# Patient Record
Sex: Male | Born: 1950 | Race: White | Hispanic: No | Smoking: Never smoker
Health system: Southern US, Community
[De-identification: ages and names within clinical notes are randomized; demographics above are authoritative.]

## PROBLEM LIST (undated history)

## (undated) DIAGNOSIS — I509 Heart failure, unspecified: Secondary | ICD-10-CM

## (undated) DIAGNOSIS — K219 Gastro-esophageal reflux disease without esophagitis: Secondary | ICD-10-CM

## (undated) DIAGNOSIS — N183 Chronic kidney disease, stage 3 unspecified: Secondary | ICD-10-CM

## (undated) DIAGNOSIS — I2510 Atherosclerotic heart disease of native coronary artery without angina pectoris: Secondary | ICD-10-CM

## (undated) DIAGNOSIS — G4730 Sleep apnea, unspecified: Secondary | ICD-10-CM

## (undated) DIAGNOSIS — I2089 Other forms of angina pectoris: Secondary | ICD-10-CM

## (undated) DIAGNOSIS — E119 Type 2 diabetes mellitus without complications: Secondary | ICD-10-CM

## (undated) DIAGNOSIS — I119 Hypertensive heart disease without heart failure: Secondary | ICD-10-CM

## (undated) DIAGNOSIS — I219 Acute myocardial infarction, unspecified: Secondary | ICD-10-CM

## (undated) DIAGNOSIS — I208 Other forms of angina pectoris: Secondary | ICD-10-CM

## (undated) HISTORY — DX: Acute myocardial infarction, unspecified: I21.9

## (undated) HISTORY — PX: CORONARY ARTERY BYPASS GRAFT: SHX141

## (undated) HISTORY — PX: WRIST SURGERY: SHX841

## (undated) HISTORY — PX: BACK SURGERY: SHX140

## (undated) HISTORY — PX: REPLACEMENT TOTAL KNEE: SUR1224

## (undated) HISTORY — PX: CARDIAC CATHETERIZATION: SHX172

---

## 2000-09-18 ENCOUNTER — Encounter: Payer: Self-pay | Admitting: Cardiothoracic Surgery

## 2000-09-19 ENCOUNTER — Inpatient Hospital Stay (HOSPITAL_COMMUNITY): Admission: RE | Admit: 2000-09-19 | Discharge: 2000-09-25 | Payer: Self-pay | Admitting: Cardiothoracic Surgery

## 2000-09-19 ENCOUNTER — Encounter: Payer: Self-pay | Admitting: Cardiothoracic Surgery

## 2000-09-20 ENCOUNTER — Encounter: Payer: Self-pay | Admitting: Cardiothoracic Surgery

## 2000-09-21 ENCOUNTER — Encounter: Payer: Self-pay | Admitting: Cardiothoracic Surgery

## 2001-09-29 ENCOUNTER — Encounter: Payer: Self-pay | Admitting: Emergency Medicine

## 2001-09-29 ENCOUNTER — Emergency Department (HOSPITAL_COMMUNITY): Admission: EM | Admit: 2001-09-29 | Discharge: 2001-09-29 | Payer: Self-pay | Admitting: Emergency Medicine

## 2003-02-04 ENCOUNTER — Encounter: Payer: Self-pay | Admitting: Orthopedic Surgery

## 2003-02-10 ENCOUNTER — Inpatient Hospital Stay (HOSPITAL_COMMUNITY): Admission: RE | Admit: 2003-02-10 | Discharge: 2003-02-15 | Payer: Self-pay | Admitting: Orthopedic Surgery

## 2004-05-03 ENCOUNTER — Other Ambulatory Visit: Payer: Self-pay

## 2006-06-26 ENCOUNTER — Ambulatory Visit: Payer: Self-pay | Admitting: *Deleted

## 2006-07-19 ENCOUNTER — Ambulatory Visit: Payer: Self-pay | Admitting: Gastroenterology

## 2006-07-19 LAB — HM COLONOSCOPY

## 2007-08-08 ENCOUNTER — Inpatient Hospital Stay (HOSPITAL_COMMUNITY): Admission: RE | Admit: 2007-08-08 | Discharge: 2007-08-14 | Payer: Self-pay | Admitting: Orthopedic Surgery

## 2007-09-03 ENCOUNTER — Encounter: Payer: Self-pay | Admitting: Orthopedic Surgery

## 2007-09-06 ENCOUNTER — Ambulatory Visit: Payer: Self-pay | Admitting: Cardiology

## 2007-09-23 ENCOUNTER — Encounter: Payer: Self-pay | Admitting: Orthopedic Surgery

## 2007-10-21 ENCOUNTER — Encounter: Payer: Self-pay | Admitting: Orthopedic Surgery

## 2009-03-30 ENCOUNTER — Ambulatory Visit: Payer: Self-pay | Admitting: Family Medicine

## 2011-01-04 NOTE — Op Note (Signed)
Joshua Larsen, Joshua Larsen             ACCOUNT NO.:  [id number]   MEDICAL RECORD NO.:  [id number]          PATIENT TYPE:  INP   LOCATION:  0006                         FACILITY:  [facility]   PHYSICIAN:  Ollen Gross, M.D.    DATE OF BIRTH:  05/12/1951   DATE OF PROCEDURE:  08/08/2007  DATE OF DISCHARGE:                               OPERATIVE REPORT   PREOPERATIVE DIAGNOSIS:  Osteoarthritis, right knee.   POSTOPERATIVE DIAGNOSIS:  Osteoarthritis, right knee.   PROCEDURE:  Right total knee arthroplasty.   SURGEON:  Ollen Gross, M.D.   ASSISTANT:  Alexzandrew L. Perkins, P.A.C.   ANESTHESIA:  General with postop Marcaine pain pump.   ESTIMATED BLOOD LOSS:  Minimal.   DRAINS:  None.   TOURNIQUET TIME:  47 minutes at 300 mmHg.   COMPLICATIONS:  None.   CONDITION:  Stable to recovery.   BRIEF CLINICAL NOTE:  Joshua Larsen is a 60 year old male with severe end stage  arthritis of the right knee with progressively worsening pain and  dysfunction.  He has had a successful left total knee arthroplasty and  presents now for right total knee arthroplasty after failure of  nonoperative management.   PROCEDURE IN DETAIL:  After successful administration of general  anesthetic, a tourniquet was placed high on the right thigh and the  right lower extremity prepped and draped in the usual sterile fashion.  The extremity was wrapped in an Esmarch, knee flexed, tourniquet  inflated to 300 mmHg.  A midline incision was made with a 10 blade  through subcutaneous tissue to the level of the extensor mechanism.  A  fresh blade is used to make a medial parapatellar arthrotomy.  The soft  tissue of the proximal medial tibia was subperiosteally elevated to the  joint line with the knife and into the semimembranosus bursa with a Cobb  elevator.  The soft tissue laterally was elevated with attention being  paid to avoid the patellar tendon on the tibial tubercle.  The patella  was subluxed laterally, knee  flexed 90 degrees, ACL and PCL removed.  A  drill was used to create a starting hole in the distal femur and the  canal was thoroughly irrigated.  The 5 degrees right valgus alignment  guide is placed referencing off the posterior condyles, rotation is  marked on the block, pinned to remove 11 mm off the distal femur.  I  took 11 because of his flexion contracture.  The distal femoral  resection is made with an oscillating saw.  Sizing blocks placed, size 5  is the most appropriate.  Rotation was marked off the epicondylar axis.  Size 5 cutting block was placed and the anterior, posterior, and chamfer  cuts were made.   The tibia is subluxed forward and the menisci are removed.  Extramedullary tibial alignment guide is placed referencing proximally  at the medial aspect of the tibial tubercle and distally along the  second metatarsal axis and tibial crest.  The block is pinned to remove  about 10 mm off the non-deficient lateral side.  Tibial resection is  made with an oscillating saw.  Size 5 is the most appropriate tibial  component and the proximal tibia was prepared with the modular drill and  keel punch for a size 5.  Femoral preparation is completed with the  intercondylar cut.   A size 5 mobile bearing tibial trial, size 5 posterior stabilized  femoral trial, and a 10 mm posterior stabilized rotating platform insert  trial were placed.  With the 10, there was a tiny bit of hyperextension.  Went to a 12.5 which allowed for full extension with excellent varus  valgus anterior and posterior balance throughout full range of motion.  The patella was everted, thickness measured to be 27 mm.  Freehand  resection taken to 15 mm, 41 template is placed, lug holes were drilled,  trial patella was placed and it tracks normally.  Osteophytes were  removed off the posterior femur with the trial in place.  All trials  were removed and the cut bone surfaces prepared with pulsatile lavage.   Cement was mixed and once ready for implantation, the size 5 mobile  bearing tibial tray, size 5 posterior stabilized femur, and 41 patella  are cemented into place.  The patella was held with the clamp.  The  trial 12.5 insert was placed, knee held in full extension, and all  extruded cement removed.  When the cement had fully hardened, then the  wound was copiously irrigated with saline solution.  The trials were  removed and FloSeal injected on the posterior capsule.  The permanent  12.5 mm posterior stabilized rotating platform insert was placed into  the tibial tray.  FloSeal was then injected into the medial and lateral  gutters and suprapatellar area.  A moist sponge was placed and  tourniquet was released with a total time of 47 minutes.  A sponge is  held for two minutes, removed, and then minimal bleeding was  encountered.  That which was encountered was stopped with cautery.  The  wound was again irrigated and the arthrotomy closed with interrupted #1  PDS.  Flexion against gravity to 135 degrees.  The subcu was closed with  interrupted 2-0 Vicryl and subcuticular running 4-0 Monocryl.  The  incision is cleaned and dried and Steri-Strips and a bulky sterile  dressing applied.  He was placed into a knee immobilizer, awakened, and  transferred to recovery in stable condition.      Ollen Gross, M.D.  Electronically Signed     FA/MEDQ  D:  08/08/2007  T:  08/08/2007  Job:  [id number]

## 2011-01-07 NOTE — Discharge Summary (Signed)
NAME:  Joshua Larsen, Joshua Larsen                       ACCOUNT NO.:  [id number]   MEDICAL RECORD NO.:  [id number]                   PATIENT TYPE:  INP   LOCATION:  0453                                 FACILITY:  [facility]   PHYSICIAN:  Ollen Gross, M.D.                 DATE OF BIRTH:  Nov 08, 1950   DATE OF ADMISSION:  02/10/2003  DATE OF DISCHARGE:  02/15/2003                                 DISCHARGE SUMMARY   ADMITTING DIAGNOSES:  1. Severe osteoarthritis left knee.  2. Bronchitis.  3. Hypertension.  4. Coronary arterial disease.  5. Status post bypass grafting.   DISCHARGE DIAGNOSES:  1. Osteoarthritis left knee status post left total knee replacement     arthroplasty.  2. Mild postoperative blood loss anemia.  3. Mild postoperative hyponatremia.  4. Bronchitis.  5. Hypertension.  6. Coronary arterial disease.  7. Status post bypass grafting.   PROCEDURE:  The patient was taken to the OR on February 10, 2003.  Underwent a  left total knee arthroplasty.  Surgeon Ollen Gross, M.D.  Assistant  Alexzandrew L. Julien Girt, P.A.  Surgery under general anesthesia.  Minimal  blood loss.  Hemovac drain x1.  Tourniquet time 60 minutes with 350 mmHg.   CONSULTS:  None.   BRIEF HISTORY:  The patient is a 60 year old male who has been seen for  ongoing problems concerning his knee.  He has had painful clicking and  snapping in his left knee.  He is a very active gentleman.  He has undergone  cortisone injections in the past by Dr. Ainsley Spinner in [place].  He has seen  Ollen Gross, M.D. in the office where x-rays showed bone on bone deformity  and tricompartmental arthritis.  It is felt he has reached a point where he  could benefit from undergoing knee replacement.  Risks and benefits of the  procedure have been discussed with the patient.  He has elected to proceed  with surgery.   LABORATORY DATA:  CBC on admission:  Hemoglobin 14.1, hematocrit 40.7, white  cell count 8.9, red cell count 4.97.   Differential all within normal limits.  Postoperative H&H 10.6 and 30.3.  Last noted H&H 9.6 and 27.8.  PT/PTT on  admission 12.4 and 30 respectively with INR 0.9.  Serial proTimes followed.  Last noted PT/INR 24.7 and 2.7.  Chem panel on admission slightly lower ALP  at 38.  Remaining chem panel all within normal limits.  Serial BMETs were  followed.  Sodium did drop from 138 down to 131.  Glucose went up from 112  to 146 back down to 118.  Calcium dropped from 10 down to 7.8.  Urinalysis  on admission was negative.  Blood group type A+.  Chest x-ray dated February 08, 2003:  No evidence of active disease in the chest, appear to have been  previous coronary artery bypass grafts.   HOSPITAL COURSE:  The patient  was admitted to [facility].  Taken  to OR.  Underwent above stated procedure without complications.  The patient  tolerated procedure well.  Was later returned to recovery room and then to  the orthopedic floor for continued postoperative care.  Vital signs were  followed.  The patient was given 24 hours of postoperative IV antibiotics.  Placed on Coumadin for DVT prophylaxis.  The patient was placed on PC  analgesics for pain control following surgery and also supplemented by p.o.  medications.  Hemovac drain placed at time of surgery was pulled on  postoperative day one.  The patient also had a Marcaine inflow pain pump  placed at the time of surgery which was pulled on postoperative day two.  PT  and OT were consulted to assist with gait training, ambulation, and ADLs.  The patient was slow to progress with physical therapy initially only  ambulating approximately 10 feet by postoperative day two.  The patient had  a little bit of nausea on postoperative day one, although his pain was under  good control.  By day two the nausea had improved but he had developed some  itching.  Was given Atarax for the itching.  The Marcaine pain pump was then  removed.  PCA was  discontinued.  Also, the IV fluids were discontinued.  He,  unfortunately, developed some ankle pain that evening and the next day.  The  patient did have a known history of gout and was felt to be a gout flare-up.  He was given colchicine which only had a little bit of improvement.  He  actually did better on Indocin.  He was placed on Pepcid and Indocin.  We  were only going to use that for a couple of days while in the hospital.  By  day four the pain was still present, but slowly improving on the  medications.  It was noted even in his therapy notes that he was not able to  get up on day three and day four very well secondary to the gout.  His knee  was healing well.  By day two the dressing was changed.  No signs of  infection.  No hemarthrosis.  Continued to improve with daily wound care and  observation.  As the ankle pain did improve, he was able to get up a little  bit more.  In fact, by day four he more than likely would have been able to  go home had he been able to participate much better with therapy if he had  not had the gout flare-up but he was only slowly progressing.  By day five,  however, though his gout flare-up had improved.  He was getting up much  better with physical therapy.  Had been seen in rounds by the covering  orthopedic staff and it was decided the patient would be discharged home at  that time.   DISCHARGE PLAN:  The patient is discharged home on February 15, 2003.   DISCHARGE DIAGNOSES:  Please see above.   DISCHARGE MEDICATIONS:  1. Percocet for pain.  2. Robaxin per spasm.  3. Coumadin as per pharmacy protocol.  4. The patient was to continue home medications.   DIET:  As tolerated.   ACTIVITY:  Weightbearing as tolerated to left lower extremity.  Home health  PT, home health nursing through [facility].  Total knee protocol.   FOLLOWUP:  Follow up two weeks from surgery.    DISPOSITION:  Home.   CONDITION ON DISCHARGE:   Improved.    Alexzandrew L. Julien Girt, P.A.              Ollen Gross, M.D.    ALP/MEDQ  D:  03/10/2003  T:  03/10/2003  Job:  [id number]   cc:   Julieanne Manson  [address]  [place]  [place] [address]  Fax: [phone]

## 2011-01-07 NOTE — H&P (Signed)
Joshua Larsen, Joshua Larsen             ACCOUNT NO.:  [id number]   MEDICAL RECORD NO.:  [id number]          PATIENT TYPE:  INP   LOCATION:  1617                         FACILITY:  [facility]   PHYSICIAN:  Ollen Gross, M.D.    DATE OF BIRTH:  06/13/1951   DATE OF ADMISSION:  08/08/2007  DATE OF DISCHARGE:  08/14/2007                              HISTORY & PHYSICAL   CHIEF COMPLAINT:  Right knee pain.   HISTORY OF PRESENT ILLNESS:  The patient is a 56-year male who has been  seen by Dr. Lequita Halt for ongoing right knee pain.  He has known end-stage  arthritis and is seen by Dr. Lequita Halt for discussion of possible surgery.  He has obtained medical and cardiac clearance.  He has actually lost  weight. X-rays show end-stage bone-on-bone. He is ready to have surgery  done.  Risks and benefits discussed and patient's subsequently admitted  to the hospital.   ALLERGIES:  1. CODEINE causes sickness.  2. TAGAMET causes changes in mental status.   CURRENT MEDICATIONS:  Omeprazole, Lipofen, metoprolol, Naprosyn,  Diovan/hydrochlorothiazide.   PAST MEDICAL HISTORY:  1. History of gout.  2. Arthritis.  3. History of bronchitis.  4. Sleep apnea, hypertension.  5. Coronary arterial disease.  6. Reflux disease.  7. History of gastritis.  8. History of hepatitis.  9. History of renal calculi.   PAST MEDICAL AND SURGICAL HISTORY:  1. Back surgery for cyst.  2. Bypass surgery of five vessels.  3. Left knee replacement.  4. Right wrist surgery.   SOCIAL HISTORY:  Single, Merchandiser, retail at [employer]. Nonsmoker.  No  alcohol.  One child.  Girlfriend and mother will be assisting with care  after surgery.   FAMILY HISTORY:  Father with heart disease and stroke.  Mother with  arthritis.  Grandmother with cancer.   REVIEW OF SYSTEMS:  GENERAL:  No fevers, chills, night sweats.  NEUROLOGIC:  No seizures, syncope or paralysis.  RESPIRATORY:  No  shortness of breath, productive cough or hemoptysis.   CARDIOVASCULAR:  No chest pain, angina, orthopnea.  GI:  No nausea, vomiting, diarrhea,  constipation.  GU:  No dysuria, hematuria or discharge.  MUSCULOSKELETAL: Right knee.   PHYSICAL EXAMINATION:  VITAL SIGNS:  Pulse 70, respirations 12, blood  pressure 120/60.  GENERAL:  A 60 year old white male, well nourished, well developed,  overweight, obese, no acute distress, alert, oriented, and cooperative.  HEENT:  Normocephalic, atraumatic.  Pupils round and reactive.  Oropharynx clear.  EOMs intact.  NECK:  Supple with a trace left bruit.  CHEST:  Clear anterior posterior chest walls.  No rhonchi, rales or  wheezing.  HEART:  Regular rate and rhythm.  No murmur.  ABDOMEN:  Soft, round, protuberant abdomen.  Bowel sounds present.  RECTAL, BREASTS, GENITALIA:  Not done, not pertinent to present illness.  EXTREMITIES:  Right knee:  Marked crepitus.  Range of motion of 5-115.  Significant varus malalignment deformity.   IMPRESSION:  Osteoarthritis, right knee.   PLAN:  The patient will be admitted to [facility] to undergo  right total knee arthroplasty.  Surgery  will be performed by Dr. Ollen Gross.      Joshua Larsen, P.A.C.      Ollen Gross, M.D.  Electronically Signed    ALP/MEDQ  D:  08/26/2007  T:  08/26/2007  Job:  [id number]   cc:   Julieanne Manson  Fax: [phone]

## 2011-01-07 NOTE — Op Note (Signed)
[place]  Patient:    Joshua Larsen, Joshua Larsen                      MRN: [id number] Proc. Date: 09/19/00 Adm. Date:  [id number] Attending:  Mikey Bussing CC:         Mariel Kansky, M.D., [facility], [place], [place]  Kathlee Nations Suann Larry, M.D.   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  OPERATION/PROCEDURE: Coronary artery bypass grafting x 5 (left internal mammary artery to left anterior descending, saphenous vein graft to diagonal, sequential saphenous vein graft to the distal right coronary artery and posterolateral branch of the right coronary, saphenous vein graft to the obtuse marginal).  SURGEON: Mikey Bussing, M.D.  ASSISTANT: Sherrie George, P.A.-C.  ANESTHESIA: General by Dr. Kaylyn Layer. [place].  INDICATIONS FOR PROCEDURE: The patient is a 60 year old obese white male, who was evaluated by Dr. Lady Gary in [place], [place] for exertional dyspnea and chest tightness.  Cardiac catheterization was performed and demonstrated an 80% left main stenosis with high-grade proximal right coronary artery stenosis.  Overall left ventricular function was preserved.  The patient was referred for surgical coronary revascularization.  Prior to the operation I examined the patient in the office and reviewed the results of the cardiac catheterization with the patient and his mother.  I discussed the indications and expected benefits of coronary bypass surgery.  I discussed the major aspects of the operation including the choice of conduit, the location of the surgical incisions, the use of general anesthesia and cardiopulmonary bypass, and the expected possible recovery.  I reviewed the risks associated with  this operation including the risk of MI, CVA, bleeding, infection, blood transfusion requirement, and death.  He understood these implications for surgery, the alternatives of surgery, and he agreed to proceed with the operation under what I believe is informed consent.  OPERATIVE FINDINGS: The patients saphenous vein was of average quality.  The vein to the right coronary system was extremely tortuous with multiple branches.  The mammary artery was a good vessel with excellent flow.  Due to the patients body habitus exposure of the posterior ventricle was difficult. The obtuse marginal vessels were intramyocardial.  DESCRIPTION OF PROCEDURE: The patient was brought to the operating room and placed supine on the operating room table, and general anesthesia induced under invasive hemodynamic monitoring.  The chest, abdomen, and legs were prepped with Betadine and draped as a sterile field.  A median sternotomy was performed as the saphenous vein was harvested from the right leg.  The internal mammary artery was harvested as a pedicle graft from its origin at the subclavian vessels.  Heparin was administered and ACT was documented as being therapeutic.  The sternal retractor was placed, using the deep blades due to the patients body habitus.  Two pursestring sutures were placed in the ascending aorta and right atrium and the patient was cannulated and placed on bypass, and cooled to 32 degrees.  The coronaries for grafting were identified and the mammary artery and vein grafts were prepared for the distal anastomoses.  A cardioplegic cannula was placed and the patient was cooled to 28 degrees.  The aortic crossclamp was applied and 500 cc of cold blood cardioplegia delivered to the  aortic root with immediate cardioplegic arrest and septal temperature dropping to less than 12 degrees.  Topical iced saline slush was used to augment myocardial preservation and a pericardial insulator pad was  used to protect the left phrenic nerve.  The distal coronary anastomoses were then performed.  The first distal anastomosis was at the diagonal.  This was a 1.5 mm vessel with proximally 80% stenosis.  Reverse saphenous vein was sewn end-to-side with running 7-0 Prolene.  The second distal anastomosis was the obtuse marginal, which had a proximal 90% stenosis.  A reverse saphenous vein was sewn end-to-side with running 7-0 Prolene, with good flow through the graft.  The third and fourth distal anastomoses consisted of sequential vein to the distal right just prior to the posterior descending bifurcation and continuing to the posterolateral for the second anastomosis and the sequential graft.  The distal right was a 1.8 mm vessel with proximal 90% stenosis and a side branch of the vein was sewn side-to-side with running 7-0 Prolene with good flow through the graft. The fourth distal anastomosis was continuation of the sequential vein to the posterolateral, which was a 1.3 mm vessel with proximal 70% stenosis between the origin of the PL and the distal right.  The end of the vein was sewn end-to-side with running 7-0 Prolene and there was good flow through the graft.  Cardioplegia was redosed.  The fifth distal anastomosis was to the mid LAD, which was a 2.0 mm vessel with proximal 90% stenosis.  The left internal mammary artery pedicle was brought through an opening created in the left lateral pericardium and brought down on the LAD and sewn end-to-side with running 8-0 Prolene.  There was excellent flow through the anastomosis, with immediate rise in septal temperature after release of the pedicle clamp on the mammary artery.  The mammary pedicle was secured to the epicardium and the aortic crossclamp was removed.  The heart was cardioverted back to a regular rhythm.  Three proximal vein anastomoses were placed under a partial occlusion clamp.  The vein grafts were perfused and each had  excellent flow and hemostasis was documented at the  proximal and distal sites.  The patient was rewarmed to 37 degrees and temporary pacing wires were applied.  The lungs were re-expanded and the ventilator was resumed.  The patient was weaned from cardiopulmonary bypass without difficulty, with stable blood pressure and hemodynamics.  Protamine was administered and the cannulas were removed.  The mediastinum was irrigated with warm antibiotic irrigation.  The leg incision was irrigated and closed in standard fashion.  The pericardium was loosely reapproximated.  Two mediastinal and a left pleural chest tube were placed and brought out through separate incisions.  The sternum was reapproximated with interrupted steel wire.  The pectoralis fascia was closed with interrupted #1 Vicryl.  The subcutaneous was closed with a 2-0 Vicryl and the skin was closed with staples.  The patient was returned to the ICU in stable condition.  Crossclamp time was 58 minutes, with a bypass time of 120 minutes. DD:  09/19/00 TD:  09/20/00 Job: [id number]

## 2011-01-07 NOTE — Discharge Summary (Signed)
NAMENICHOLSON, STARACE             ACCOUNT NO.:  [id number]   MEDICAL RECORD NO.:  [id number]          PATIENT TYPE:  INP   LOCATION:  1617                         FACILITY:  [facility]   PHYSICIAN:  Ollen Gross, M.D.    DATE OF BIRTH:  January 21, 1951   DATE OF ADMISSION:  08/08/2007  DATE OF DISCHARGE:  08/14/2007                               DISCHARGE SUMMARY   ADMITTING DIAGNOSES:  1. Osteoarthritis, right knee.  2. History of gout.  3. Arthritis.  4. History of bronchitis.  5. Sleep apnea.  6. Hypertension.  7. Coronary arterial disease.  8. Reflux disease.  9. History of gastritis.  10.History of hepatitis.  11.History of renal calculi.   DISCHARGE DIAGNOSES:  1. Osteoarthritis, right knee, status post right total knee      arthroplasty.  2. Postoperative chest discomfort, felt to be gastrointestinal,      dyspepsia.  3. Postoperative blood loss anemia  4. History of gout.  5. Arthritis.  6. History of bronchitis.  7. Sleep apnea.  8. Hypertension.  9. Coronary arterial disease.  10.Reflux disease.  11.History of gastritis.  12.History of hepatitis.  13.History of renal calculi.   PROCEDURE:  August 08, 2007, right total knee.  Surgeon:  Dr. Lequita Halt.  Assistant:  Avel Peace PA-C.   CONSULTATIONS:  None.   BRIEF HISTORY:  Galen is a 60 year old male with severe end-stage  arthritis of right knee, progressively worsening pain and dysfunction,  successful left total knee.  He now presents for right total knee.   LABORATORY DATA:  Preop CBC showed hemoglobin 12.5, hematocrit 36.4,  white cell count 8.6.  Postoperatively, hemoglobin was 10.5, drifted  down to 9.9. Last hemoglobin 9.6, hematocrit  27.5.  PT and PTT preop  14.1 and 32, respectively.  INR 1.1.  Serial pro times followed. Last PT  24.7, INR 2.2.  Chemistry panel on admission all within normal limits.  Serial BMETs were followed and remained within normal limits.  Cardiac  enzymes taken x2.  First set  taken on August 09, 2007: CK normal at  210, CK-MB slightly elevated at 4.2 with a normal index of 2.0, troponin  0.01. Second set:  CK normal at 220, CK-MB a little high at 4.9, normal  index of 2.2, troponin 0.02.  Preop UA negative.  Blood group type A  positive.   EKG:  There is a preop EKG dated November 20, 2006:  Looks like sinus  rhythm confirmed, unable to read signature. Followup EKG on August 09, 2007:  Normal sinus rhythm, nonspecific T-wave abnormalities.  Unconfirmed also.   Two-view chest August 02, 2007:  Post CABG, no active disease.   Portable chest August 09, 2007:  No acute findings with normal change  since preop chest x-ray day of August 02, 2007.   HOSPITAL COURSE:  The patient was admitted to [facility],  tolerated seizure well, later transferred to recovery room and then the  orthopedic floor. Started on PCA and p.o. analgesic pain control  following surgery. Actually doing pretty well on the morning of day #1.  Had very little  pain, felt good.  Started to get up out of bed.  Started  on CPAP postop because he has sleep apnea. Medications were resumed  postop. Had no complaints. Started back on his omeprazole.  Later,  unfortunately, on the afternoon of day #1, got called to see Mr.  Termini for some chest discomfort.  He denied any shortness of breath  or lightheadedness.  No nausea and vomiting.  He has a prior history of  coronary arterial disease with bypass in 2002, but had not had any  cardiac events since then.  His cardiologist was out of town at [facility].  He did relate giving a history of change of position in bed  with his head tilted down earlier that morning. Had known reflux.  Episode started up about 30 minutes after that change in position.  He  was given an GI cocktail with almost immediate relief.  We did order  sublingual nitroglycerin also. Chest x-ray and EKG were ordered.  There  were no obvious changes on the  EKG compared. Cardiac enzymes looked  good. CK-MB was elevated on 2, but the index was normal, and the  troponins were normal. Felt to be more GI related, so we increased  omeprazole to twice a day. By the following day, he was doing much  better, no more chest discomfort, felt to be more dyspepsia in nature.   With regards to his therapy, he was doing better.  Started getting up  and walking a little bit more ambulation.  Unfortunately on day #3, he  started to have a little bit more difficult time with discomfort.  He  said both of his feet were extremely sensitive to the touch.  He does  have a history of gout. We discontinued the TED hose to his feet and  tried to keep his feet off the bed to lessen the pressure. This did slow  down his therapy over the weekend. Started him on prednisone for  possible flare. That did help because on the afternoon of day #4, he was  up walking about 140 feet. Foley was out. Continued to progress after  that, and that was all felt to be due to a gout flareup over the  weekend. By December 22, he was doing much better.  He had taken the  colchicine and was on the prednisone taper. His pain had improved.  He  is up ambulating much better.  Monitored him for one more day.  His leg  pain continued to improve, and by August 14, 2007, he was doing well  and was discharged home.   DISCHARGE PLAN:  1. The patient was discharged home on August 14, 2007.  2. For Discharge Diagnoses, please see above.  3. Discharge medications:  Vicodin, Flexeril Coumadin and finishing      the prednisone taper.  4. Activity:  Weightbearing as tolerated right leg.  5. Home health PT and home health nursing, total knee protocol.  6. Diet.  Resume home diet.  7. Followup:  Follow up with Dr. Lequita Halt on December 30 or January 2.      Call for appointment.  8. Daily dressing changes.   DISPOSITION:  Home.   CONDITION ON DISCHARGE:  Improved.      Alexzandrew L.  Perkins, P.A.C.      Ollen Gross, M.D.  Electronically Signed    ALP/MEDQ  D:  09/25/2007  T:  09/25/2007  Job:  [id number]   cc:   Gerlene Burdock  [place]  Fax: [phone]

## 2011-01-07 NOTE — Discharge Summary (Signed)
[facility]  Patient:    ANTWAIN, CALIENDO                      MRN: [id number] Adm. Date:  [id number] Disc. Date: 09/24/00 Attending:  Mikey Bussing Dictator:   Myrlene Broker, P.A.                           Discharge Summary  DATE OF BIRTH:  1951-07-03  SURGEON:  Mikey Bussing, M.D.  CARDIOLOGIST:  Dr. Harold Hedge.  PRIMARY CARE PHYSICIAN:  Dr. Julieanne Manson.  PRIMARY ADMISSION DIAGNOSIS/DISCHARGE DIAGNOSIS:  Class III angina, positive stress test and left main stenosis with severe three-vessel coronary artery disease.  SECONDARY DIAGNOSES/PREEXISTING CONDITIONS: 1. Hypertension. 2. Obesity. 3. Gout. 4. History of hepatitis A at 60 years old. 5. Degenerative joint disease with left knee.  DISCHARGE DIAGNOSIS:  Left main stenosis with severe three-vessel coronary artery disease.  PROCEDURES:  On September 19, 2000, coronary artery bypass graft surgery x 5 was done with left internal mammary artery to the left anterior descending artery, saphenous vein graft to the circumflex, saphenous vein graft to the diagonal, saphenous vein graft to the distal right coronary artery, and posterior descending coronary artery.  HOSPITAL COURSE:  The patient was admitted on September 19, 2000, underwent coronary artery bypass graft done on September 19, 2000.  The patient tolerated the procedure well.  His postoperative course was uneventful.  The patient was ambulated daily by cardiac rehab, and the patient is anticipated for discharge in the morning of September 24, 2000.  ACTIVITY:  The patient is instructed not to do any driving or lifting more than 10 pounds.  To walk daily.  To continue breathing exercises.  DIET:  The patient is to follow a low fat, low sodium diet.  WOUND CARE:  He can shower and clean the wounds with mild soap and water, and to call the office if any wound problems arise as noted on facts sheet.  The patient was  given cardiac surgery facts sheet.  FOLLOWUP:  The patient is to have a staple removal in one week after discharge.  The patient is to have a follow-up appointment with Dr. Lady Gary.  He needs to call the office to be seen in two weeks, and to have his chest x-ray taken there at his cardiologists office, and to bring that chest x-ray to his appointment with Dr. Morton Peters in three weeks.  The office will call the patient with that appointment.  CONDITION ON DISCHARGE:  Stable and improved.  DISCHARGE MEDICATIONS: 1. Enteric-coated aspirin daily. 2. Darvocet-N 100 one tab q.4-6h. p.r.n. pain. 3. Atenolol 12.5 mg b.i.d. 4. Ativan 1 mg q.d. 5. Nitroglycerin p.r.n. 6. Indomethacin 25 mg q.d. for gout. 7. The patient is to continue his home medications and vitamins. DD:  09/23/00 TD:  09/25/00 Job: [id number]

## 2011-01-07 NOTE — Op Note (Signed)
NAME:  Joshua Larsen, Joshua Larsen                       ACCOUNT NO.:  [id number]   MEDICAL RECORD NO.:  [id number]                   PATIENT TYPE:  INP   LOCATION:  0004                                 FACILITY:  [facility]   PHYSICIAN:  Ollen Gross, M.D.                 DATE OF BIRTH:  1950-09-05   DATE OF PROCEDURE:  02/10/2003  DATE OF DISCHARGE:                                 OPERATIVE REPORT   PREOPERATIVE DIAGNOSIS:  Osteoarthritis, left knee.   POSTOPERATIVE DIAGNOSIS:  Osteoarthritis, left knee.   PROCEDURE:  Left total knee arthroplasty.   SURGEON:  Ollen Gross, M.D.   ASSISTANT:  Alexzandrew L. Julien Girt, P.A.   ANESTHESIA:  General.   ESTIMATED BLOOD LOSS:  Minimal.   DRAINS:  Hemovac x1.   COMPLICATIONS:  None.   TOURNIQUET TIME:  60 minutes at  350 mmHg.   CONDITION:  Stable to recovery.   CLINICAL NOTE:  Joshua Larsen is a 60 year old male with severe end-stage  osteoarthritis of both knees, with pain refractory to nonoperative  management.  The left knee is currently more symptomatic than the right.  He  presents now for left total knee arthroplasty.   PROCEDURE IN DETAIL:  After successful administration of general anesthetic,  a tourniquet was placed high on the left thigh and the left lower extremity  prepped and draped in the usual sterile fashion.  The extremity was wrapped  in an Esmarch, the knee flexed, the tourniquet inflated to 350 mmHg.  A  standard midline incision was made with a 10 blade through subcutaneous  tissue to the level of the extensor mechanism.  A fresh blade was used to  make a medial parapatellar arthrotomy and the soft tissue over the proximal  medial tibia was subperiosteally elevated to the joint line with a knife and  at the semimembranosus bursa with a curved osteotome.  The soft tissue over  the proximal lateral tibia is also elevated with attention being paid to  avoid the patellar tendon on the tibial tubercle.  The patella is  everted,  the knee flexed 90 degrees, ACL and PCL removed.  He had severe  tricompartmental degenerative change with large osteophyte formation.  A  drill was used to create a starting hole in the distal femur.  The canal was  irrigated and the 5 degree left valgus alignment guide was placed.  Referencing off the posterior condyles, rotation was marked and the block  pinned to remove 10 mm off the distal femur.  Distal femoral resection is  made with an oscillating saw.  A sizing block is placed and a size 6 is the  most appropriate in the AP and the medial and lateral planes.  Rotation is  marked off, the epicondylar axis block pinned, and the anterior and  posterior cuts made for a size 6.  The tibia is then subluxed forward and  the menisci removed.  The  extramedullary tibial alignment guide is placed,  referencing proximally at the medial aspect of the tibial tubercle and  distally along the second metatarsal axis at the tibial crest.  The block is  pinned to remove 10 mm off the nondeficient lateral side.  Tibial resection  is made with an oscillating saw.  Sizing is placed and size 5 is the most  appropriate tibia.  The proximal tibia is then prepared with a modular drill  and keel punch.   Femoral preparation is then completed with the intercondylar and chamfer  cuts.  A size 6 posterior-stabilized femoral trial and size 5 mobile bearing  tibial trial with a 10 mm posterior-stabilized rotating platform insert  trial were placed.  The knee is not able to be fully extended, with  excellent varus and valgus balance throughout full range of motion.  The  patella is then everted, thickness measured to be 27 mm, and free-hand  resection taken to 15 mm, a 38 template placed, lug holes drilled, trial  patella placed and tracks normally.  The osteophytes are then removed off  the posterior femur with the trial in place.  All trials are removed and the  cut bone surfaces prepared with  pulsatile lavage.  Cement is mixed and once  ready for implantation, a size 6 posterior-stabilized femur, size 5 mobile  bearing tibial tray, and 38 patella are cemented into place.  The size 6 10  mm insert trial is placed, the knee held in full extension, and all extruded  cement removed.  Once cement is fully hardened, then a permanent 10 mm size  6 rotating platform, posterior-stabilized implant is placed to the tibial  tray.  The wound is copiously irrigated with antibiotic solution and the  extensor mechanism closed over a Hemovac drain with interrupted #1 PDS.  Flexion against gravity is about 115 degrees.  The stop was his calf hitting  up against his posterior thigh.  The tourniquet is released, a total time of  60 minutes.  Subcu tissue closed with interrupted 2-0 Vicryl, subcuticularly  with 4-0 Monocryl.  The incision is cleaned and dried and Steri-Strips and a  bulky sterile dressing applied.  He is placed into a knee immobilizer,  awakened, and transported to recovery in stable condition.  Prior to this, a  Marcaine pain catheter was placed into the subcutaneous tissues and the pump  was started.                                               Ollen Gross, M.D.    FA/MEDQ  D:  02/10/2003  T:  02/11/2003  Job:  [id number]

## 2011-01-07 NOTE — H&P (Signed)
NAME:  Joshua Larsen, Joshua Larsen                       ACCOUNT NO.:  [id number]   MEDICAL RECORD NO.:  [id number]                   PATIENT TYPE:  INP   LOCATION:  NA                                   FACILITY:  [facility]   PHYSICIAN:  Ollen Gross, M.D.                 DATE OF BIRTH:  October 20, 1950   DATE OF ADMISSION:  02/10/2003  DATE OF DISCHARGE:                                HISTORY & PHYSICAL   CHIEF COMPLAINT:  Pain in my left knee.   HISTORY OF PRESENT ILLNESS:  This 60 year old male has been seen by [place] for  continuing problems concerning his knees.  The patient was seen recently on  December 17, 2002, complaining of left knee pain.  He has constant pain, has  extreme difficulty in getting walking coming from a sitting position.  He  has painful clicking and snapping in the knee.  He is a very active  gentleman.  He has noticed other than the activity inhibition that occurs  because of his knee pain.  He has had cortisone injections to his knee by  Dr. Kennith Center in [place].  X-rays have shown bone on bone deformities with  tricompartmental arthritis.  After much discussion and consideration, it was  felt this patient would benefit from surgical intervention and is being  admitted for a total knee replacement arthroplasty of the left knee.   The patient had a five-vessel bypass surgery by Dr. Donata Clay two years ago  and is doing quite well after that.   PAST MEDICAL HISTORY:  1. Surgeries include the bypass as mentioned above.  2. He had pilonidal cyst removal back in 1970.  3. He has history of fracture of the right distal radius with floating     bone in the carpus region.  4. Medically he has currently a dry cough treated with Prevacid and Biaxin.  5. He has recently been diagnosed with sleep apnea, now has a C-PAP machine.  6. He also has hypertension.  7. He has gastritis secondary to stress as well.   ALLERGIES:  1. TAGAMET.  2. CODEINE.   SOCIAL HISTORY:  The patient is  single.  He is Merchandiser, retail at [employer].  He has no intake of alcohol or tobacco products.  His primary  caregiver after surgery will be his mother, Mora Bellman.   FAMILY HISTORY:  Heart disease and stroke in the father.   REVIEW OF SYSTEMS:  CENTRAL NERVOUS SYSTEM:  No seizures or paralysis,  numbness, or double vision.  RESPIRATORY:  The patient has dry cough as  mentioned above, thought to be secondary to reflux irritation secondary to  bronchitis again under treatment.  CARDIOVASCULAR:  No chest pain, no  angina, no orthopnea.  GASTROINTESTINAL:  No nausea, vomiting, melena, or  bloody stools.  GENITOURINARY:  No discharge, dysuria, or hematuria/  MUSCULOSKELETAL:  Primarily in present illness.   CURRENT MEDICATIONS:  1. Indocin 25 mg.  2. Aspirin 81 mg daily.  He will stop these five days prior to surgery.  3. Prevacid 30 mg one daily.  4. Diovan 160 one daily.  5. HCTZ 25 mg one daily.  6. Toprol XL 25 mg in the evening.   PHYSICAL EXAMINATION:  GENERAL:  This is an alert and cooperative, friendly,  obese, 60 year old white male accompanied by his daughter.  VITAL SIGNS:  Blood pressure 140/64, pulse 68, respirations 18.  HEENT:  Normocephalic.  PERRLA.  EOM intact.  Oropharynx is clear.  CHEST:  Clear to auscultation.  No rhonchi or rales.  HEART:  Regular rate and rhythm.  No murmurs are heard.  ABDOMEN:  Soft, nontender.  Liver and spleen not felt.  GENITALIA/RECTAL:  Not done, not pertinent to present illness.  EXTREMITIES:  The patient has crepitus, range of motion to left knee 0-110  degrees and markedly uncomfortable.  He has a 10 degree varus deformity on  the left as well.   ADMISSION DIAGNOSES:  1. Severe osteoarthritis of the left knee.  2. Bronchitis.  3. Hypertension.  4. Coronary artery disease (post bypass).   PLAN:  The patient will undergo a left total knee replacement arthroplasty.  Plan to have home health after his regular hospitalization with  [place].           Dooley L. Cherlynn June.                 Ollen Gross, M.D.    DLU/MEDQ  D:  02/05/2003  T:  02/05/2003  Job:  [id number]   cc:   Julieanne Manson  [address]  [place]  [place] [address]  Fax: [phone]

## 2011-05-27 LAB — BASIC METABOLIC PANEL
BUN: 15
CO2: 27
Calcium: 8.4
Chloride: 101
Creatinine, Ser: 1.13
GFR calc Af Amer: >60
GFR calc non Af Amer: >60
Glucose, Bld: 129 — ABNORMAL HIGH
Glucose, Bld: 131 — ABNORMAL HIGH
Potassium: 3.7
Potassium: 4
Sodium: 135

## 2011-05-27 LAB — PROTIME-INR
INR: 2.2 — ABNORMAL HIGH
INR: 2.3 — ABNORMAL HIGH
Prothrombin Time: 19.8 — ABNORMAL HIGH
Prothrombin Time: 25.7 — ABNORMAL HIGH

## 2011-05-27 LAB — CBC
HCT: 28.7 — ABNORMAL LOW
HCT: 30.7 — ABNORMAL LOW
Hemoglobin: 10.5 — ABNORMAL LOW
MCHC: 34.4
MCHC: 34.8
MCV: 75.7 — ABNORMAL LOW
MCV: 76.2 — ABNORMAL LOW
MCV: 76.2 — ABNORMAL LOW
Platelets: 174
Platelets: 190
RBC: 3.63 — ABNORMAL LOW
RBC: 4.02 — ABNORMAL LOW
RDW: 15.8 — ABNORMAL HIGH
RDW: 16 — ABNORMAL HIGH
WBC: 11.3 — ABNORMAL HIGH
WBC: 11.5 — ABNORMAL HIGH

## 2011-05-27 LAB — CARDIAC PANEL(CRET KIN+CKTOT+MB+TROPI)
CK, MB: 4.2 — ABNORMAL HIGH
CK, MB: 4.9 — ABNORMAL HIGH
Relative Index: 2
Relative Index: 2.2
Troponin I: 0.02

## 2011-05-27 LAB — TYPE AND SCREEN: Antibody Screen: NEGATIVE

## 2011-05-30 LAB — URINALYSIS, ROUTINE W REFLEX MICROSCOPIC
Bilirubin Urine: NEGATIVE
Hgb urine dipstick: NEGATIVE
Ketones, ur: NEGATIVE
Protein, ur: NEGATIVE
Specific Gravity, Urine: 1.02
Urobilinogen, UA: 0.2

## 2011-05-30 LAB — COMPREHENSIVE METABOLIC PANEL
Alkaline Phosphatase: 39
BUN: 19
CO2: 26
Chloride: 108
Creatinine, Ser: 1.09
GFR calc non Af Amer: >60
Glucose, Bld: 115 — ABNORMAL HIGH
Potassium: 4.2
Total Bilirubin: 0.6

## 2011-05-30 LAB — CBC
HCT: 36.4 — ABNORMAL LOW
Hemoglobin: 12.5 — ABNORMAL LOW
MCV: 75.6 — ABNORMAL LOW
WBC: 8.6

## 2011-05-30 LAB — PROTIME-INR
INR: 1.1
Prothrombin Time: 14.1

## 2011-07-05 ENCOUNTER — Emergency Department: Payer: Self-pay | Admitting: Emergency Medicine

## 2011-11-09 DIAGNOSIS — E785 Hyperlipidemia, unspecified: Secondary | ICD-10-CM | POA: Diagnosis not present

## 2011-11-09 DIAGNOSIS — Z79899 Other long term (current) drug therapy: Secondary | ICD-10-CM | POA: Diagnosis not present

## 2011-11-09 DIAGNOSIS — E119 Type 2 diabetes mellitus without complications: Secondary | ICD-10-CM | POA: Diagnosis not present

## 2011-11-09 DIAGNOSIS — I2510 Atherosclerotic heart disease of native coronary artery without angina pectoris: Secondary | ICD-10-CM | POA: Diagnosis not present

## 2011-11-09 DIAGNOSIS — R5381 Other malaise: Secondary | ICD-10-CM | POA: Diagnosis not present

## 2011-11-21 DIAGNOSIS — I10 Essential (primary) hypertension: Secondary | ICD-10-CM | POA: Diagnosis not present

## 2011-11-21 DIAGNOSIS — I2510 Atherosclerotic heart disease of native coronary artery without angina pectoris: Secondary | ICD-10-CM | POA: Diagnosis not present

## 2011-11-21 DIAGNOSIS — E782 Mixed hyperlipidemia: Secondary | ICD-10-CM | POA: Diagnosis not present

## 2011-11-21 DIAGNOSIS — E119 Type 2 diabetes mellitus without complications: Secondary | ICD-10-CM | POA: Diagnosis not present

## 2011-12-01 DIAGNOSIS — I10 Essential (primary) hypertension: Secondary | ICD-10-CM | POA: Diagnosis not present

## 2011-12-01 DIAGNOSIS — N200 Calculus of kidney: Secondary | ICD-10-CM | POA: Diagnosis not present

## 2011-12-01 DIAGNOSIS — N179 Acute kidney failure, unspecified: Secondary | ICD-10-CM | POA: Diagnosis not present

## 2011-12-02 ENCOUNTER — Ambulatory Visit: Payer: Self-pay | Admitting: Nephrology

## 2011-12-02 DIAGNOSIS — N1330 Unspecified hydronephrosis: Secondary | ICD-10-CM | POA: Diagnosis not present

## 2011-12-02 DIAGNOSIS — N201 Calculus of ureter: Secondary | ICD-10-CM | POA: Diagnosis not present

## 2011-12-02 DIAGNOSIS — N200 Calculus of kidney: Secondary | ICD-10-CM | POA: Diagnosis not present

## 2011-12-06 DIAGNOSIS — N400 Enlarged prostate without lower urinary tract symptoms: Secondary | ICD-10-CM | POA: Diagnosis not present

## 2011-12-06 DIAGNOSIS — N201 Calculus of ureter: Secondary | ICD-10-CM | POA: Diagnosis not present

## 2011-12-06 DIAGNOSIS — N200 Calculus of kidney: Secondary | ICD-10-CM | POA: Diagnosis not present

## 2011-12-08 DIAGNOSIS — N200 Calculus of kidney: Secondary | ICD-10-CM | POA: Diagnosis not present

## 2011-12-13 DIAGNOSIS — N200 Calculus of kidney: Secondary | ICD-10-CM | POA: Diagnosis not present

## 2012-02-27 DIAGNOSIS — E669 Obesity, unspecified: Secondary | ICD-10-CM | POA: Diagnosis not present

## 2012-02-27 DIAGNOSIS — I10 Essential (primary) hypertension: Secondary | ICD-10-CM | POA: Diagnosis not present

## 2012-02-27 DIAGNOSIS — I2510 Atherosclerotic heart disease of native coronary artery without angina pectoris: Secondary | ICD-10-CM | POA: Diagnosis not present

## 2012-03-15 DIAGNOSIS — E7800 Pure hypercholesterolemia, unspecified: Secondary | ICD-10-CM | POA: Diagnosis not present

## 2012-03-15 DIAGNOSIS — I10 Essential (primary) hypertension: Secondary | ICD-10-CM | POA: Diagnosis not present

## 2012-03-15 DIAGNOSIS — N289 Disorder of kidney and ureter, unspecified: Secondary | ICD-10-CM | POA: Diagnosis not present

## 2012-03-15 DIAGNOSIS — Z23 Encounter for immunization: Secondary | ICD-10-CM | POA: Diagnosis not present

## 2012-03-15 DIAGNOSIS — I2510 Atherosclerotic heart disease of native coronary artery without angina pectoris: Secondary | ICD-10-CM | POA: Diagnosis not present

## 2012-03-30 DIAGNOSIS — E669 Obesity, unspecified: Secondary | ICD-10-CM | POA: Diagnosis not present

## 2012-03-30 DIAGNOSIS — E119 Type 2 diabetes mellitus without complications: Secondary | ICD-10-CM | POA: Diagnosis not present

## 2012-04-04 ENCOUNTER — Ambulatory Visit: Payer: Self-pay | Admitting: Family Medicine

## 2012-04-04 DIAGNOSIS — E119 Type 2 diabetes mellitus without complications: Secondary | ICD-10-CM | POA: Diagnosis not present

## 2012-04-04 DIAGNOSIS — IMO0002 Reserved for concepts with insufficient information to code with codable children: Secondary | ICD-10-CM | POA: Diagnosis not present

## 2012-04-04 DIAGNOSIS — E669 Obesity, unspecified: Secondary | ICD-10-CM | POA: Diagnosis not present

## 2012-04-04 DIAGNOSIS — M51379 Other intervertebral disc degeneration, lumbosacral region without mention of lumbar back pain or lower extremity pain: Secondary | ICD-10-CM | POA: Diagnosis not present

## 2012-04-04 DIAGNOSIS — M5137 Other intervertebral disc degeneration, lumbosacral region: Secondary | ICD-10-CM | POA: Diagnosis not present

## 2012-04-04 DIAGNOSIS — I2510 Atherosclerotic heart disease of native coronary artery without angina pectoris: Secondary | ICD-10-CM | POA: Diagnosis not present

## 2012-04-04 DIAGNOSIS — M549 Dorsalgia, unspecified: Secondary | ICD-10-CM | POA: Diagnosis not present

## 2012-04-12 ENCOUNTER — Ambulatory Visit: Payer: Self-pay

## 2012-04-12 DIAGNOSIS — Z7189 Other specified counseling: Secondary | ICD-10-CM | POA: Diagnosis not present

## 2012-04-12 DIAGNOSIS — E119 Type 2 diabetes mellitus without complications: Secondary | ICD-10-CM | POA: Diagnosis not present

## 2012-04-18 DIAGNOSIS — M79609 Pain in unspecified limb: Secondary | ICD-10-CM | POA: Diagnosis not present

## 2012-04-22 ENCOUNTER — Ambulatory Visit: Payer: Self-pay

## 2012-05-14 DIAGNOSIS — J069 Acute upper respiratory infection, unspecified: Secondary | ICD-10-CM | POA: Diagnosis not present

## 2012-05-14 DIAGNOSIS — E119 Type 2 diabetes mellitus without complications: Secondary | ICD-10-CM | POA: Diagnosis not present

## 2012-05-14 DIAGNOSIS — J329 Chronic sinusitis, unspecified: Secondary | ICD-10-CM | POA: Diagnosis not present

## 2012-05-14 DIAGNOSIS — I2510 Atherosclerotic heart disease of native coronary artery without angina pectoris: Secondary | ICD-10-CM | POA: Diagnosis not present

## 2012-05-23 DIAGNOSIS — I2510 Atherosclerotic heart disease of native coronary artery without angina pectoris: Secondary | ICD-10-CM | POA: Diagnosis not present

## 2012-05-23 DIAGNOSIS — E669 Obesity, unspecified: Secondary | ICD-10-CM | POA: Diagnosis not present

## 2012-05-30 DIAGNOSIS — I10 Essential (primary) hypertension: Secondary | ICD-10-CM | POA: Diagnosis not present

## 2012-05-30 DIAGNOSIS — I25810 Atherosclerosis of coronary artery bypass graft(s) without angina pectoris: Secondary | ICD-10-CM | POA: Diagnosis not present

## 2012-05-30 DIAGNOSIS — E782 Mixed hyperlipidemia: Secondary | ICD-10-CM | POA: Diagnosis not present

## 2012-05-30 DIAGNOSIS — E119 Type 2 diabetes mellitus without complications: Secondary | ICD-10-CM | POA: Diagnosis not present

## 2012-05-31 ENCOUNTER — Ambulatory Visit: Payer: Self-pay

## 2012-05-31 DIAGNOSIS — Z7189 Other specified counseling: Secondary | ICD-10-CM | POA: Diagnosis not present

## 2012-05-31 DIAGNOSIS — E119 Type 2 diabetes mellitus without complications: Secondary | ICD-10-CM | POA: Diagnosis not present

## 2012-06-04 DIAGNOSIS — J342 Deviated nasal septum: Secondary | ICD-10-CM | POA: Diagnosis not present

## 2012-06-04 DIAGNOSIS — H02409 Unspecified ptosis of unspecified eyelid: Secondary | ICD-10-CM | POA: Diagnosis not present

## 2012-06-04 DIAGNOSIS — J018 Other acute sinusitis: Secondary | ICD-10-CM | POA: Diagnosis not present

## 2012-06-04 DIAGNOSIS — J328 Other chronic sinusitis: Secondary | ICD-10-CM | POA: Diagnosis not present

## 2012-06-21 DIAGNOSIS — E119 Type 2 diabetes mellitus without complications: Secondary | ICD-10-CM | POA: Diagnosis not present

## 2012-06-22 ENCOUNTER — Ambulatory Visit: Payer: Self-pay

## 2012-06-26 DIAGNOSIS — J018 Other acute sinusitis: Secondary | ICD-10-CM | POA: Diagnosis not present

## 2012-06-26 DIAGNOSIS — H02409 Unspecified ptosis of unspecified eyelid: Secondary | ICD-10-CM | POA: Diagnosis not present

## 2012-06-26 DIAGNOSIS — J342 Deviated nasal septum: Secondary | ICD-10-CM | POA: Diagnosis not present

## 2012-06-26 DIAGNOSIS — J328 Other chronic sinusitis: Secondary | ICD-10-CM | POA: Diagnosis not present

## 2012-07-23 DIAGNOSIS — H02409 Unspecified ptosis of unspecified eyelid: Secondary | ICD-10-CM | POA: Diagnosis not present

## 2012-07-24 ENCOUNTER — Ambulatory Visit: Payer: Self-pay | Admitting: Otolaryngology

## 2012-07-24 DIAGNOSIS — Z01812 Encounter for preprocedural laboratory examination: Secondary | ICD-10-CM | POA: Diagnosis not present

## 2012-07-24 DIAGNOSIS — I2510 Atherosclerotic heart disease of native coronary artery without angina pectoris: Secondary | ICD-10-CM | POA: Diagnosis not present

## 2012-07-24 LAB — BASIC METABOLIC PANEL
Anion Gap: 4 — ABNORMAL LOW (ref 7–16)
Calcium, Total: 9.4 mg/dL (ref 8.5–10.1)
Chloride: 108 mmol/L — ABNORMAL HIGH (ref 98–107)
Co2: 28 mmol/L (ref 21–32)
EGFR (African American): >60

## 2012-07-24 LAB — CBC WITH DIFFERENTIAL/PLATELET
Basophil %: 0.5 %
Eosinophil %: 1.6 %
HCT: 34.8 % — ABNORMAL LOW (ref 40.0–52.0)
HGB: 11.5 g/dL — ABNORMAL LOW (ref 13.0–18.0)
Lymphocyte #: 1.9 10*3/uL (ref 1.0–3.6)
MCV: 79 fL — ABNORMAL LOW (ref 80–100)
Monocyte %: 6.5 %
Platelet: 213 10*3/uL (ref 150–440)
RBC: 4.42 10*6/uL (ref 4.40–5.90)

## 2012-07-31 ENCOUNTER — Ambulatory Visit: Payer: Self-pay | Admitting: Cardiology

## 2012-07-31 DIAGNOSIS — I200 Unstable angina: Secondary | ICD-10-CM | POA: Diagnosis not present

## 2012-07-31 DIAGNOSIS — I2510 Atherosclerotic heart disease of native coronary artery without angina pectoris: Secondary | ICD-10-CM | POA: Diagnosis not present

## 2012-08-01 DIAGNOSIS — I2510 Atherosclerotic heart disease of native coronary artery without angina pectoris: Secondary | ICD-10-CM | POA: Diagnosis not present

## 2012-08-02 ENCOUNTER — Ambulatory Visit: Payer: Self-pay | Admitting: Otolaryngology

## 2012-08-02 DIAGNOSIS — I10 Essential (primary) hypertension: Secondary | ICD-10-CM | POA: Diagnosis not present

## 2012-08-02 DIAGNOSIS — Z951 Presence of aortocoronary bypass graft: Secondary | ICD-10-CM | POA: Diagnosis not present

## 2012-08-02 DIAGNOSIS — E669 Obesity, unspecified: Secondary | ICD-10-CM | POA: Diagnosis not present

## 2012-08-02 DIAGNOSIS — Z880 Allergy status to penicillin: Secondary | ICD-10-CM | POA: Diagnosis not present

## 2012-08-02 DIAGNOSIS — Z79899 Other long term (current) drug therapy: Secondary | ICD-10-CM | POA: Diagnosis not present

## 2012-08-02 DIAGNOSIS — Z885 Allergy status to narcotic agent status: Secondary | ICD-10-CM | POA: Diagnosis not present

## 2012-08-02 DIAGNOSIS — H02409 Unspecified ptosis of unspecified eyelid: Secondary | ICD-10-CM | POA: Diagnosis not present

## 2012-08-02 DIAGNOSIS — G43909 Migraine, unspecified, not intractable, without status migrainosus: Secondary | ICD-10-CM | POA: Diagnosis not present

## 2012-08-02 DIAGNOSIS — E119 Type 2 diabetes mellitus without complications: Secondary | ICD-10-CM | POA: Diagnosis not present

## 2012-08-02 DIAGNOSIS — R0609 Other forms of dyspnea: Secondary | ICD-10-CM | POA: Diagnosis not present

## 2012-08-02 DIAGNOSIS — Z96659 Presence of unspecified artificial knee joint: Secondary | ICD-10-CM | POA: Diagnosis not present

## 2012-08-02 DIAGNOSIS — Z7982 Long term (current) use of aspirin: Secondary | ICD-10-CM | POA: Diagnosis not present

## 2012-08-02 DIAGNOSIS — I2510 Atherosclerotic heart disease of native coronary artery without angina pectoris: Secondary | ICD-10-CM | POA: Diagnosis not present

## 2012-08-27 DIAGNOSIS — E669 Obesity, unspecified: Secondary | ICD-10-CM | POA: Diagnosis not present

## 2012-10-02 DIAGNOSIS — I10 Essential (primary) hypertension: Secondary | ICD-10-CM | POA: Diagnosis not present

## 2012-10-25 DIAGNOSIS — M109 Gout, unspecified: Secondary | ICD-10-CM | POA: Diagnosis not present

## 2012-10-30 DIAGNOSIS — E119 Type 2 diabetes mellitus without complications: Secondary | ICD-10-CM | POA: Diagnosis not present

## 2012-10-30 DIAGNOSIS — N182 Chronic kidney disease, stage 2 (mild): Secondary | ICD-10-CM | POA: Diagnosis not present

## 2012-10-30 DIAGNOSIS — E669 Obesity, unspecified: Secondary | ICD-10-CM | POA: Diagnosis not present

## 2012-10-30 DIAGNOSIS — I10 Essential (primary) hypertension: Secondary | ICD-10-CM | POA: Diagnosis not present

## 2012-12-03 DIAGNOSIS — E119 Type 2 diabetes mellitus without complications: Secondary | ICD-10-CM | POA: Diagnosis not present

## 2012-12-03 DIAGNOSIS — I25810 Atherosclerosis of coronary artery bypass graft(s) without angina pectoris: Secondary | ICD-10-CM | POA: Diagnosis not present

## 2012-12-03 DIAGNOSIS — I10 Essential (primary) hypertension: Secondary | ICD-10-CM | POA: Diagnosis not present

## 2012-12-03 DIAGNOSIS — E782 Mixed hyperlipidemia: Secondary | ICD-10-CM | POA: Diagnosis not present

## 2013-01-01 DIAGNOSIS — E119 Type 2 diabetes mellitus without complications: Secondary | ICD-10-CM | POA: Diagnosis not present

## 2013-01-22 DIAGNOSIS — E119 Type 2 diabetes mellitus without complications: Secondary | ICD-10-CM | POA: Diagnosis not present

## 2013-03-14 DIAGNOSIS — E785 Hyperlipidemia, unspecified: Secondary | ICD-10-CM | POA: Diagnosis not present

## 2013-03-14 DIAGNOSIS — I10 Essential (primary) hypertension: Secondary | ICD-10-CM | POA: Diagnosis not present

## 2013-03-14 DIAGNOSIS — E119 Type 2 diabetes mellitus without complications: Secondary | ICD-10-CM | POA: Diagnosis not present

## 2013-03-14 DIAGNOSIS — I2510 Atherosclerotic heart disease of native coronary artery without angina pectoris: Secondary | ICD-10-CM | POA: Diagnosis not present

## 2013-04-24 DIAGNOSIS — M109 Gout, unspecified: Secondary | ICD-10-CM | POA: Diagnosis not present

## 2013-04-24 DIAGNOSIS — E119 Type 2 diabetes mellitus without complications: Secondary | ICD-10-CM | POA: Diagnosis not present

## 2013-05-01 DIAGNOSIS — E119 Type 2 diabetes mellitus without complications: Secondary | ICD-10-CM | POA: Diagnosis not present

## 2013-05-01 DIAGNOSIS — I10 Essential (primary) hypertension: Secondary | ICD-10-CM | POA: Diagnosis not present

## 2013-05-22 DIAGNOSIS — I10 Essential (primary) hypertension: Secondary | ICD-10-CM | POA: Diagnosis not present

## 2013-05-22 DIAGNOSIS — E785 Hyperlipidemia, unspecified: Secondary | ICD-10-CM | POA: Diagnosis not present

## 2013-05-22 DIAGNOSIS — E119 Type 2 diabetes mellitus without complications: Secondary | ICD-10-CM | POA: Diagnosis not present

## 2013-05-22 DIAGNOSIS — I25810 Atherosclerosis of coronary artery bypass graft(s) without angina pectoris: Secondary | ICD-10-CM | POA: Diagnosis not present

## 2013-07-17 DIAGNOSIS — E785 Hyperlipidemia, unspecified: Secondary | ICD-10-CM | POA: Diagnosis not present

## 2013-07-17 DIAGNOSIS — R5381 Other malaise: Secondary | ICD-10-CM | POA: Diagnosis not present

## 2013-07-17 DIAGNOSIS — I10 Essential (primary) hypertension: Secondary | ICD-10-CM | POA: Diagnosis not present

## 2013-07-23 DIAGNOSIS — I2510 Atherosclerotic heart disease of native coronary artery without angina pectoris: Secondary | ICD-10-CM | POA: Diagnosis not present

## 2013-07-23 DIAGNOSIS — Z0000 Encounter for general adult medical examination without abnormal findings: Secondary | ICD-10-CM | POA: Diagnosis not present

## 2013-07-23 DIAGNOSIS — Z1331 Encounter for screening for depression: Secondary | ICD-10-CM | POA: Diagnosis not present

## 2013-07-23 DIAGNOSIS — E785 Hyperlipidemia, unspecified: Secondary | ICD-10-CM | POA: Diagnosis not present

## 2013-07-23 DIAGNOSIS — E119 Type 2 diabetes mellitus without complications: Secondary | ICD-10-CM | POA: Diagnosis not present

## 2013-07-23 DIAGNOSIS — Z1339 Encounter for screening examination for other mental health and behavioral disorders: Secondary | ICD-10-CM | POA: Diagnosis not present

## 2013-07-23 DIAGNOSIS — Z125 Encounter for screening for malignant neoplasm of prostate: Secondary | ICD-10-CM | POA: Diagnosis not present

## 2013-07-31 DIAGNOSIS — E119 Type 2 diabetes mellitus without complications: Secondary | ICD-10-CM | POA: Diagnosis not present

## 2013-08-07 DIAGNOSIS — E119 Type 2 diabetes mellitus without complications: Secondary | ICD-10-CM | POA: Diagnosis not present

## 2013-10-09 ENCOUNTER — Inpatient Hospital Stay: Payer: Self-pay | Admitting: Internal Medicine

## 2013-10-09 DIAGNOSIS — Z7902 Long term (current) use of antithrombotics/antiplatelets: Secondary | ICD-10-CM | POA: Diagnosis not present

## 2013-10-09 DIAGNOSIS — Z87442 Personal history of urinary calculi: Secondary | ICD-10-CM | POA: Diagnosis not present

## 2013-10-09 DIAGNOSIS — I214 Non-ST elevation (NSTEMI) myocardial infarction: Secondary | ICD-10-CM | POA: Diagnosis not present

## 2013-10-09 DIAGNOSIS — R52 Pain, unspecified: Secondary | ICD-10-CM | POA: Diagnosis not present

## 2013-10-09 DIAGNOSIS — Z800 Family history of malignant neoplasm of digestive organs: Secondary | ICD-10-CM | POA: Diagnosis not present

## 2013-10-09 DIAGNOSIS — Z96659 Presence of unspecified artificial knee joint: Secondary | ICD-10-CM | POA: Diagnosis not present

## 2013-10-09 DIAGNOSIS — I2510 Atherosclerotic heart disease of native coronary artery without angina pectoris: Secondary | ICD-10-CM | POA: Diagnosis present

## 2013-10-09 DIAGNOSIS — F329 Major depressive disorder, single episode, unspecified: Secondary | ICD-10-CM | POA: Diagnosis present

## 2013-10-09 DIAGNOSIS — I25810 Atherosclerosis of coronary artery bypass graft(s) without angina pectoris: Secondary | ICD-10-CM | POA: Diagnosis present

## 2013-10-09 DIAGNOSIS — Z7982 Long term (current) use of aspirin: Secondary | ICD-10-CM | POA: Diagnosis not present

## 2013-10-09 DIAGNOSIS — Z8674 Personal history of sudden cardiac arrest: Secondary | ICD-10-CM | POA: Diagnosis not present

## 2013-10-09 DIAGNOSIS — E119 Type 2 diabetes mellitus without complications: Secondary | ICD-10-CM | POA: Diagnosis not present

## 2013-10-09 DIAGNOSIS — I200 Unstable angina: Secondary | ICD-10-CM | POA: Diagnosis not present

## 2013-10-09 DIAGNOSIS — N200 Calculus of kidney: Secondary | ICD-10-CM | POA: Diagnosis not present

## 2013-10-09 DIAGNOSIS — G4733 Obstructive sleep apnea (adult) (pediatric): Secondary | ICD-10-CM | POA: Diagnosis present

## 2013-10-09 DIAGNOSIS — Z8249 Family history of ischemic heart disease and other diseases of the circulatory system: Secondary | ICD-10-CM | POA: Diagnosis not present

## 2013-10-09 DIAGNOSIS — Z79899 Other long term (current) drug therapy: Secondary | ICD-10-CM | POA: Diagnosis not present

## 2013-10-09 DIAGNOSIS — J984 Other disorders of lung: Secondary | ICD-10-CM | POA: Diagnosis not present

## 2013-10-09 DIAGNOSIS — E785 Hyperlipidemia, unspecified: Secondary | ICD-10-CM | POA: Diagnosis not present

## 2013-10-09 DIAGNOSIS — N189 Chronic kidney disease, unspecified: Secondary | ICD-10-CM | POA: Diagnosis present

## 2013-10-09 DIAGNOSIS — I428 Other cardiomyopathies: Secondary | ICD-10-CM | POA: Diagnosis not present

## 2013-10-09 DIAGNOSIS — F3289 Other specified depressive episodes: Secondary | ICD-10-CM | POA: Diagnosis present

## 2013-10-09 DIAGNOSIS — R0789 Other chest pain: Secondary | ICD-10-CM | POA: Diagnosis not present

## 2013-10-09 DIAGNOSIS — D509 Iron deficiency anemia, unspecified: Secondary | ICD-10-CM | POA: Diagnosis present

## 2013-10-09 DIAGNOSIS — Z823 Family history of stroke: Secondary | ICD-10-CM | POA: Diagnosis not present

## 2013-10-09 DIAGNOSIS — K219 Gastro-esophageal reflux disease without esophagitis: Secondary | ICD-10-CM | POA: Diagnosis present

## 2013-10-09 DIAGNOSIS — R918 Other nonspecific abnormal finding of lung field: Secondary | ICD-10-CM | POA: Diagnosis not present

## 2013-10-09 DIAGNOSIS — I10 Essential (primary) hypertension: Secondary | ICD-10-CM | POA: Diagnosis not present

## 2013-10-09 DIAGNOSIS — I129 Hypertensive chronic kidney disease with stage 1 through stage 4 chronic kidney disease, or unspecified chronic kidney disease: Secondary | ICD-10-CM | POA: Diagnosis present

## 2013-10-09 DIAGNOSIS — I447 Left bundle-branch block, unspecified: Secondary | ICD-10-CM | POA: Diagnosis present

## 2013-10-09 LAB — CBC
HCT: 35.6 % — ABNORMAL LOW (ref 40.0–52.0)
HGB: 11.3 g/dL — AB (ref 13.0–18.0)
MCH: 24.7 pg — ABNORMAL LOW (ref 26.0–34.0)
MCHC: 31.8 g/dL — ABNORMAL LOW (ref 32.0–36.0)
MCV: 78 fL — ABNORMAL LOW (ref 80–100)
Platelet: 258 10*3/uL (ref 150–440)
RBC: 4.57 10*6/uL (ref 4.40–5.90)
RDW: 16.2 % — ABNORMAL HIGH (ref 11.5–14.5)
WBC: 9.6 10*3/uL (ref 3.8–10.6)

## 2013-10-09 LAB — CK TOTAL AND CKMB (NOT AT ARMC)
CK, TOTAL: 211 U/L
CK, Total: 1765 U/L — ABNORMAL HIGH
CK, Total: 2443 U/L — ABNORMAL HIGH
CK-MB: 4 ng/mL — ABNORMAL HIGH (ref 0.5–3.6)
CK-MB: 181 ng/mL — ABNORMAL HIGH (ref 0.5–3.6)
CK-MB: 263.3 ng/mL — AB (ref 0.5–3.6)

## 2013-10-09 LAB — TROPONIN I
TROPONIN-I: 32 ng/mL — AB
Troponin-I: <0.02 ng/mL
Troponin-I: >40 ng/mL

## 2013-10-09 LAB — COMPREHENSIVE METABOLIC PANEL
ANION GAP: 6 — AB (ref 7–16)
Albumin: 3.6 g/dL (ref 3.4–5.0)
Alkaline Phosphatase: 33 U/L — ABNORMAL LOW
BILIRUBIN TOTAL: 0.2 mg/dL (ref 0.2–1.0)
BUN: 25 mg/dL — AB (ref 7–18)
CHLORIDE: 107 mmol/L (ref 98–107)
Calcium, Total: 9.4 mg/dL (ref 8.5–10.1)
Co2: 24 mmol/L (ref 21–32)
Creatinine: 1.49 mg/dL — ABNORMAL HIGH (ref 0.60–1.30)
GFR CALC AF AMER: 57 — AB
GFR CALC NON AF AMER: 50 — AB
GLUCOSE: 179 mg/dL — AB (ref 65–99)
OSMOLALITY: 283 (ref 275–301)
Potassium: 4 mmol/L (ref 3.5–5.1)
SGOT(AST): 25 U/L (ref 15–37)
SGPT (ALT): 22 U/L (ref 12–78)
Sodium: 137 mmol/L (ref 136–145)
TOTAL PROTEIN: 7.9 g/dL (ref 6.4–8.2)

## 2013-10-09 LAB — APTT
ACTIVATED PTT: 35.3 s (ref 23.6–35.9)
Activated PTT: 37.1 secs — ABNORMAL HIGH (ref 23.6–35.9)
Activated PTT: 51.7 secs — ABNORMAL HIGH (ref 23.6–35.9)

## 2013-10-09 LAB — MAGNESIUM: Magnesium: 1.4 mg/dL — ABNORMAL LOW

## 2013-10-09 LAB — URINALYSIS, COMPLETE
BACTERIA: NONE SEEN
Bilirubin,UR: NEGATIVE
Glucose,UR: NEGATIVE mg/dL (ref 0–75)
Hyaline Cast: 1
KETONE: NEGATIVE
LEUKOCYTE ESTERASE: NEGATIVE
NITRITE: NEGATIVE
PH: 5 (ref 4.5–8.0)
PROTEIN: NEGATIVE
SPECIFIC GRAVITY: 1.036 (ref 1.003–1.030)
WBC UR: 1 /HPF (ref 0–5)

## 2013-10-10 DIAGNOSIS — I214 Non-ST elevation (NSTEMI) myocardial infarction: Secondary | ICD-10-CM | POA: Diagnosis not present

## 2013-10-10 LAB — CBC WITH DIFFERENTIAL/PLATELET
BASOS ABS: 0 10*3/uL (ref 0.0–0.1)
Basophil %: 0.5 %
EOS ABS: 0.2 10*3/uL (ref 0.0–0.7)
EOS PCT: 1.9 %
HCT: 29.3 % — ABNORMAL LOW (ref 40.0–52.0)
HGB: 10 g/dL — ABNORMAL LOW (ref 13.0–18.0)
LYMPHS PCT: 18.1 %
Lymphocyte #: 1.8 10*3/uL (ref 1.0–3.6)
MCH: 26.3 pg (ref 26.0–34.0)
MCHC: 34.2 g/dL (ref 32.0–36.0)
MCV: 77 fL — ABNORMAL LOW (ref 80–100)
Monocyte #: 0.7 x10 3/mm (ref 0.2–1.0)
Monocyte %: 6.8 %
Neutrophil #: 7.2 10*3/uL — ABNORMAL HIGH (ref 1.4–6.5)
Neutrophil %: 72.7 %
PLATELETS: 222 10*3/uL (ref 150–440)
RBC: 3.81 10*6/uL — ABNORMAL LOW (ref 4.40–5.90)
RDW: 16.2 % — AB (ref 11.5–14.5)
WBC: 9.9 10*3/uL (ref 3.8–10.6)

## 2013-10-10 LAB — BASIC METABOLIC PANEL
Anion Gap: 5 — ABNORMAL LOW (ref 7–16)
BUN: 19 mg/dL — AB (ref 7–18)
CALCIUM: 8.8 mg/dL (ref 8.5–10.1)
CHLORIDE: 103 mmol/L (ref 98–107)
Co2: 26 mmol/L (ref 21–32)
Creatinine: 1.39 mg/dL — ABNORMAL HIGH (ref 0.60–1.30)
GFR CALC NON AF AMER: 54 — AB
GLUCOSE: 150 mg/dL — AB (ref 65–99)
Osmolality: 273 (ref 275–301)
POTASSIUM: 3.9 mmol/L (ref 3.5–5.1)
Sodium: 134 mmol/L — ABNORMAL LOW (ref 136–145)

## 2013-10-10 LAB — LIPID PANEL
CHOLESTEROL: 115 mg/dL (ref 0–200)
HDL Cholesterol: 15 mg/dL — ABNORMAL LOW (ref 40–60)
LDL CHOLESTEROL, CALC: 45 mg/dL (ref 0–100)
Triglycerides: 275 mg/dL — ABNORMAL HIGH (ref 0–200)
VLDL CHOLESTEROL, CALC: 55 mg/dL — AB (ref 5–40)

## 2013-10-10 LAB — APTT: ACTIVATED PTT: 55.2 s — AB (ref 23.6–35.9)

## 2013-10-10 LAB — TSH: Thyroid Stimulating Horm: 0.59 u[IU]/mL

## 2013-10-10 LAB — HEMOGLOBIN A1C: Hemoglobin A1C: <3.5 % — ABNORMAL LOW (ref 4.2–6.3)

## 2013-10-11 LAB — CBC WITH DIFFERENTIAL/PLATELET
BASOS ABS: 0 10*3/uL (ref 0.0–0.1)
BASOS PCT: 0.5 %
Eosinophil #: 0.1 10*3/uL (ref 0.0–0.7)
Eosinophil %: 0.9 %
HCT: 30 % — ABNORMAL LOW (ref 40.0–52.0)
HGB: 10.2 g/dL — ABNORMAL LOW (ref 13.0–18.0)
LYMPHS PCT: 18.2 %
Lymphocyte #: 1.4 10*3/uL (ref 1.0–3.6)
MCH: 26.4 pg (ref 26.0–34.0)
MCHC: 34 g/dL (ref 32.0–36.0)
MCV: 78 fL — AB (ref 80–100)
MONO ABS: 0.6 x10 3/mm (ref 0.2–1.0)
Monocyte %: 7.6 %
NEUTROS PCT: 72.8 %
Neutrophil #: 5.8 10*3/uL (ref 1.4–6.5)
Platelet: 198 10*3/uL (ref 150–440)
RBC: 3.87 10*6/uL — ABNORMAL LOW (ref 4.40–5.90)
RDW: 16.7 % — ABNORMAL HIGH (ref 11.5–14.5)
WBC: 7.9 10*3/uL (ref 3.8–10.6)

## 2013-10-11 LAB — BASIC METABOLIC PANEL
Anion Gap: 8 (ref 7–16)
BUN: 17 mg/dL (ref 7–18)
CALCIUM: 8.8 mg/dL (ref 8.5–10.1)
CHLORIDE: 103 mmol/L (ref 98–107)
Co2: 24 mmol/L (ref 21–32)
Creatinine: 1.42 mg/dL — ABNORMAL HIGH (ref 0.60–1.30)
EGFR (African American): >60
EGFR (Non-African Amer.): 53 — ABNORMAL LOW
Glucose: 244 mg/dL — ABNORMAL HIGH (ref 65–99)
Osmolality: 280 (ref 275–301)
POTASSIUM: 3.7 mmol/L (ref 3.5–5.1)
Sodium: 135 mmol/L — ABNORMAL LOW (ref 136–145)

## 2013-10-12 LAB — BASIC METABOLIC PANEL
Anion Gap: 6 — ABNORMAL LOW (ref 7–16)
BUN: 18 mg/dL (ref 7–18)
CO2: 27 mmol/L (ref 21–32)
Calcium, Total: 9.1 mg/dL (ref 8.5–10.1)
Chloride: 104 mmol/L (ref 98–107)
Creatinine: 1.49 mg/dL — ABNORMAL HIGH (ref 0.60–1.30)
EGFR (African American): 57 — ABNORMAL LOW
GFR CALC NON AF AMER: 50 — AB
Glucose: 135 mg/dL — ABNORMAL HIGH (ref 65–99)
OSMOLALITY: 278 (ref 275–301)
Potassium: 4 mmol/L (ref 3.5–5.1)
Sodium: 137 mmol/L (ref 136–145)

## 2013-10-21 DIAGNOSIS — M25519 Pain in unspecified shoulder: Secondary | ICD-10-CM | POA: Diagnosis not present

## 2013-10-23 DIAGNOSIS — E785 Hyperlipidemia, unspecified: Secondary | ICD-10-CM | POA: Diagnosis not present

## 2013-10-23 DIAGNOSIS — R51 Headache: Secondary | ICD-10-CM | POA: Diagnosis not present

## 2013-10-23 DIAGNOSIS — I2510 Atherosclerotic heart disease of native coronary artery without angina pectoris: Secondary | ICD-10-CM | POA: Diagnosis not present

## 2013-10-23 DIAGNOSIS — R5381 Other malaise: Secondary | ICD-10-CM | POA: Diagnosis not present

## 2013-10-23 DIAGNOSIS — R5383 Other fatigue: Secondary | ICD-10-CM | POA: Diagnosis not present

## 2013-10-23 DIAGNOSIS — Z1331 Encounter for screening for depression: Secondary | ICD-10-CM | POA: Diagnosis not present

## 2013-10-23 DIAGNOSIS — I10 Essential (primary) hypertension: Secondary | ICD-10-CM | POA: Diagnosis not present

## 2013-10-25 DIAGNOSIS — I25810 Atherosclerosis of coronary artery bypass graft(s) without angina pectoris: Secondary | ICD-10-CM | POA: Diagnosis not present

## 2013-10-25 DIAGNOSIS — I10 Essential (primary) hypertension: Secondary | ICD-10-CM | POA: Diagnosis not present

## 2013-10-25 DIAGNOSIS — E782 Mixed hyperlipidemia: Secondary | ICD-10-CM | POA: Diagnosis not present

## 2013-11-08 LAB — BASIC METABOLIC PANEL
BUN: 54 mg/dL — AB (ref 4–21)
CREATININE: 1.8 mg/dL — AB (ref <=1.3)
GLUCOSE: 151 mg/dL
POTASSIUM: 5.1 mmol/L (ref 3.4–5.3)
Sodium: 140 mmol/L (ref 137–147)

## 2013-11-08 LAB — LIPID PANEL
Cholesterol: 97 mg/dL (ref 0–200)
HDL: 22 mg/dL — AB (ref 35–70)
LDL Cholesterol: 48 mg/dL
LDl/HDL Ratio: 2.2
Triglycerides: 133 mg/dL (ref 40–160)

## 2013-11-08 LAB — PSA: PSA: 1.7

## 2013-11-08 LAB — CBC AND DIFFERENTIAL
HEMATOCRIT: 34 % — AB (ref 41–53)
HEMOGLOBIN: 10.7 g/dL — AB (ref 13.5–17.5)
Neutrophils Absolute: 10 /uL
PLATELETS: 359 10*3/uL (ref 150–399)
WBC: 14.6 10^3/mL

## 2013-11-08 LAB — HEPATIC FUNCTION PANEL
ALT: 18 U/L (ref 10–40)
AST: 16 U/L (ref 14–40)
Alkaline Phosphatase: 30 U/L (ref 25–125)
BILIRUBIN, TOTAL: 0.2 mg/dL

## 2013-11-08 LAB — HEMOGLOBIN A1C: Hgb A1c MFr Bld: 9.7 % — AB (ref 4.0–6.0)

## 2013-11-11 DIAGNOSIS — M25519 Pain in unspecified shoulder: Secondary | ICD-10-CM | POA: Diagnosis not present

## 2013-11-26 DIAGNOSIS — Z1331 Encounter for screening for depression: Secondary | ICD-10-CM | POA: Diagnosis not present

## 2013-11-26 DIAGNOSIS — I2510 Atherosclerotic heart disease of native coronary artery without angina pectoris: Secondary | ICD-10-CM | POA: Diagnosis not present

## 2013-11-26 DIAGNOSIS — IMO0001 Reserved for inherently not codable concepts without codable children: Secondary | ICD-10-CM | POA: Diagnosis not present

## 2013-11-26 DIAGNOSIS — Z79899 Other long term (current) drug therapy: Secondary | ICD-10-CM | POA: Diagnosis not present

## 2013-11-26 DIAGNOSIS — I10 Essential (primary) hypertension: Secondary | ICD-10-CM | POA: Diagnosis not present

## 2013-12-03 DIAGNOSIS — R079 Chest pain, unspecified: Secondary | ICD-10-CM | POA: Diagnosis not present

## 2013-12-03 DIAGNOSIS — I10 Essential (primary) hypertension: Secondary | ICD-10-CM | POA: Diagnosis not present

## 2013-12-03 DIAGNOSIS — E782 Mixed hyperlipidemia: Secondary | ICD-10-CM | POA: Diagnosis not present

## 2013-12-03 DIAGNOSIS — I25810 Atherosclerosis of coronary artery bypass graft(s) without angina pectoris: Secondary | ICD-10-CM | POA: Diagnosis not present

## 2013-12-08 ENCOUNTER — Emergency Department: Payer: Self-pay | Admitting: Emergency Medicine

## 2013-12-08 DIAGNOSIS — Z951 Presence of aortocoronary bypass graft: Secondary | ICD-10-CM | POA: Diagnosis not present

## 2013-12-08 DIAGNOSIS — N23 Unspecified renal colic: Secondary | ICD-10-CM | POA: Diagnosis not present

## 2013-12-08 DIAGNOSIS — I10 Essential (primary) hypertension: Secondary | ICD-10-CM | POA: Diagnosis not present

## 2013-12-08 DIAGNOSIS — N390 Urinary tract infection, site not specified: Secondary | ICD-10-CM | POA: Diagnosis not present

## 2013-12-08 DIAGNOSIS — Z79899 Other long term (current) drug therapy: Secondary | ICD-10-CM | POA: Diagnosis not present

## 2013-12-08 DIAGNOSIS — Z7982 Long term (current) use of aspirin: Secondary | ICD-10-CM | POA: Diagnosis not present

## 2013-12-08 DIAGNOSIS — E119 Type 2 diabetes mellitus without complications: Secondary | ICD-10-CM | POA: Diagnosis not present

## 2013-12-08 LAB — URINALYSIS, COMPLETE
BILIRUBIN, UR: NEGATIVE
Glucose,UR: NEGATIVE mg/dL (ref 0–75)
Ketone: NEGATIVE
Leukocyte Esterase: NEGATIVE
NITRITE: NEGATIVE
Ph: 5 (ref 4.5–8.0)
Protein: NEGATIVE
RBC,UR: 292 /HPF (ref 0–5)
Specific Gravity: 1.016 (ref 1.003–1.030)
Squamous Epithelial: NONE SEEN
WBC UR: <1 /HPF (ref 0–5)

## 2013-12-08 LAB — COMPREHENSIVE METABOLIC PANEL
ALK PHOS: 33 U/L — AB
ALT: 27 U/L (ref 12–78)
Albumin: 3.7 g/dL (ref 3.4–5.0)
Anion Gap: 8 (ref 7–16)
BUN: 25 mg/dL — ABNORMAL HIGH (ref 7–18)
Bilirubin,Total: 0.4 mg/dL (ref 0.2–1.0)
CHLORIDE: 105 mmol/L (ref 98–107)
Calcium, Total: 8.9 mg/dL (ref 8.5–10.1)
Co2: 24 mmol/L (ref 21–32)
Creatinine: 1.85 mg/dL — ABNORMAL HIGH (ref 0.60–1.30)
EGFR (African American): 44 — ABNORMAL LOW
GFR CALC NON AF AMER: 38 — AB
GLUCOSE: 127 mg/dL — AB (ref 65–99)
Osmolality: 280 (ref 275–301)
Potassium: 4.4 mmol/L (ref 3.5–5.1)
SGOT(AST): 28 U/L (ref 15–37)
Sodium: 137 mmol/L (ref 136–145)
Total Protein: 7.9 g/dL (ref 6.4–8.2)

## 2013-12-08 LAB — CBC
HCT: 34.6 % — AB (ref 40.0–52.0)
HGB: 11 g/dL — ABNORMAL LOW (ref 13.0–18.0)
MCH: 24 pg — ABNORMAL LOW (ref 26.0–34.0)
MCHC: 31.7 g/dL — ABNORMAL LOW (ref 32.0–36.0)
MCV: 76 fL — ABNORMAL LOW (ref 80–100)
Platelet: 198 10*3/uL (ref 150–440)
RBC: 4.56 10*6/uL (ref 4.40–5.90)
RDW: 17.4 % — ABNORMAL HIGH (ref 11.5–14.5)
WBC: 10.1 10*3/uL (ref 3.8–10.6)

## 2013-12-09 DIAGNOSIS — M25519 Pain in unspecified shoulder: Secondary | ICD-10-CM | POA: Diagnosis not present

## 2013-12-10 LAB — URINE CULTURE

## 2013-12-12 DIAGNOSIS — N201 Calculus of ureter: Secondary | ICD-10-CM | POA: Diagnosis not present

## 2013-12-12 DIAGNOSIS — R109 Unspecified abdominal pain: Secondary | ICD-10-CM | POA: Diagnosis not present

## 2013-12-13 ENCOUNTER — Inpatient Hospital Stay: Payer: Self-pay | Admitting: Student

## 2013-12-13 DIAGNOSIS — I252 Old myocardial infarction: Secondary | ICD-10-CM | POA: Diagnosis not present

## 2013-12-13 DIAGNOSIS — Z6841 Body Mass Index (BMI) 40.0 and over, adult: Secondary | ICD-10-CM | POA: Diagnosis not present

## 2013-12-13 DIAGNOSIS — N201 Calculus of ureter: Secondary | ICD-10-CM | POA: Diagnosis not present

## 2013-12-13 DIAGNOSIS — K219 Gastro-esophageal reflux disease without esophagitis: Secondary | ICD-10-CM | POA: Diagnosis present

## 2013-12-13 DIAGNOSIS — I10 Essential (primary) hypertension: Secondary | ICD-10-CM | POA: Diagnosis not present

## 2013-12-13 DIAGNOSIS — M1990 Unspecified osteoarthritis, unspecified site: Secondary | ICD-10-CM | POA: Diagnosis present

## 2013-12-13 DIAGNOSIS — N200 Calculus of kidney: Secondary | ICD-10-CM | POA: Diagnosis not present

## 2013-12-13 DIAGNOSIS — N134 Hydroureter: Secondary | ICD-10-CM | POA: Diagnosis not present

## 2013-12-13 DIAGNOSIS — N189 Chronic kidney disease, unspecified: Secondary | ICD-10-CM | POA: Diagnosis not present

## 2013-12-13 DIAGNOSIS — E669 Obesity, unspecified: Secondary | ICD-10-CM | POA: Diagnosis present

## 2013-12-13 DIAGNOSIS — N1330 Unspecified hydronephrosis: Secondary | ICD-10-CM | POA: Diagnosis not present

## 2013-12-13 DIAGNOSIS — I129 Hypertensive chronic kidney disease with stage 1 through stage 4 chronic kidney disease, or unspecified chronic kidney disease: Secondary | ICD-10-CM | POA: Diagnosis present

## 2013-12-13 DIAGNOSIS — N23 Unspecified renal colic: Secondary | ICD-10-CM | POA: Diagnosis not present

## 2013-12-13 DIAGNOSIS — E785 Hyperlipidemia, unspecified: Secondary | ICD-10-CM | POA: Diagnosis present

## 2013-12-13 DIAGNOSIS — N179 Acute kidney failure, unspecified: Secondary | ICD-10-CM | POA: Diagnosis not present

## 2013-12-13 DIAGNOSIS — I2510 Atherosclerotic heart disease of native coronary artery without angina pectoris: Secondary | ICD-10-CM | POA: Diagnosis not present

## 2013-12-13 DIAGNOSIS — E119 Type 2 diabetes mellitus without complications: Secondary | ICD-10-CM | POA: Diagnosis not present

## 2013-12-13 DIAGNOSIS — I498 Other specified cardiac arrhythmias: Secondary | ICD-10-CM | POA: Diagnosis not present

## 2013-12-13 DIAGNOSIS — Z7982 Long term (current) use of aspirin: Secondary | ICD-10-CM | POA: Diagnosis not present

## 2013-12-13 LAB — URINALYSIS, COMPLETE
BILIRUBIN, UR: NEGATIVE
Bacteria: NONE SEEN
Blood: NEGATIVE
GLUCOSE, UR: NEGATIVE mg/dL (ref 0–75)
Ketone: NEGATIVE
Leukocyte Esterase: NEGATIVE
NITRITE: NEGATIVE
PROTEIN: NEGATIVE
Ph: 6 (ref 4.5–8.0)
SPECIFIC GRAVITY: 1.014 (ref 1.003–1.030)
Squamous Epithelial: <1
WBC UR: 1 /HPF (ref 0–5)

## 2013-12-13 LAB — CBC WITH DIFFERENTIAL/PLATELET
BASOS ABS: 0.2 10*3/uL — AB (ref 0.0–0.1)
Basophil %: 1.4 %
Eosinophil #: 0.1 10*3/uL (ref 0.0–0.7)
Eosinophil %: 1.1 %
HCT: 34 % — AB (ref 40.0–52.0)
HGB: 10.9 g/dL — ABNORMAL LOW (ref 13.0–18.0)
Lymphocyte #: 1.4 10*3/uL (ref 1.0–3.6)
Lymphocyte %: 11.8 %
MCH: 24.3 pg — ABNORMAL LOW (ref 26.0–34.0)
MCHC: 32 g/dL (ref 32.0–36.0)
MCV: 76 fL — ABNORMAL LOW (ref 80–100)
Monocyte #: 1.1 x10 3/mm — ABNORMAL HIGH (ref 0.2–1.0)
Monocyte %: 9.5 %
NEUTROS ABS: 9.2 10*3/uL — AB (ref 1.4–6.5)
Neutrophil %: 76.2 %
Platelet: 224 10*3/uL (ref 150–440)
RBC: 4.49 10*6/uL (ref 4.40–5.90)
RDW: 17.6 % — ABNORMAL HIGH (ref 11.5–14.5)
WBC: 12.1 10*3/uL — ABNORMAL HIGH (ref 3.8–10.6)

## 2013-12-13 LAB — COMPREHENSIVE METABOLIC PANEL
ALBUMIN: 3.8 g/dL (ref 3.4–5.0)
ALK PHOS: 32 U/L — AB
ALT: 21 U/L (ref 12–78)
ANION GAP: 7 (ref 7–16)
BILIRUBIN TOTAL: 0.8 mg/dL (ref 0.2–1.0)
BUN: 48 mg/dL — AB (ref 7–18)
Calcium, Total: 9.8 mg/dL (ref 8.5–10.1)
Chloride: 102 mmol/L (ref 98–107)
Co2: 25 mmol/L (ref 21–32)
Creatinine: 3.52 mg/dL — ABNORMAL HIGH (ref 0.60–1.30)
EGFR (Non-African Amer.): 18 — ABNORMAL LOW
GFR CALC AF AMER: 20 — AB
Glucose: 123 mg/dL — ABNORMAL HIGH (ref 65–99)
OSMOLALITY: 282 (ref 275–301)
POTASSIUM: 4.7 mmol/L (ref 3.5–5.1)
SGOT(AST): 15 U/L (ref 15–37)
SODIUM: 134 mmol/L — AB (ref 136–145)
Total Protein: 8.5 g/dL — ABNORMAL HIGH (ref 6.4–8.2)

## 2013-12-14 ENCOUNTER — Ambulatory Visit: Payer: Self-pay

## 2013-12-14 LAB — CBC WITH DIFFERENTIAL/PLATELET
BASOS ABS: 0.1 10*3/uL (ref 0.0–0.1)
Basophil %: 0.6 %
Eosinophil #: 0.2 10*3/uL (ref 0.0–0.7)
Eosinophil %: 1.7 %
HCT: 30 % — AB (ref 40.0–52.0)
HGB: 9.5 g/dL — AB (ref 13.0–18.0)
Lymphocyte #: 1.1 10*3/uL (ref 1.0–3.6)
Lymphocyte %: 10.4 %
MCH: 24.4 pg — ABNORMAL LOW (ref 26.0–34.0)
MCHC: 31.8 g/dL — AB (ref 32.0–36.0)
MCV: 77 fL — AB (ref 80–100)
MONO ABS: 0.8 x10 3/mm (ref 0.2–1.0)
Monocyte %: 7.7 %
Neutrophil #: 8.5 10*3/uL — ABNORMAL HIGH (ref 1.4–6.5)
Neutrophil %: 79.6 %
PLATELETS: 204 10*3/uL (ref 150–440)
RBC: 3.91 10*6/uL — ABNORMAL LOW (ref 4.40–5.90)
RDW: 17.4 % — ABNORMAL HIGH (ref 11.5–14.5)
WBC: 10.7 10*3/uL — AB (ref 3.8–10.6)

## 2013-12-14 LAB — BASIC METABOLIC PANEL
ANION GAP: 7 (ref 7–16)
ANION GAP: 8 (ref 7–16)
BUN: 52 mg/dL — AB (ref 7–18)
BUN: 51 mg/dL — ABNORMAL HIGH (ref 7–18)
CALCIUM: 8.9 mg/dL (ref 8.5–10.1)
CALCIUM: 8.6 mg/dL (ref 8.5–10.1)
CHLORIDE: 105 mmol/L (ref 98–107)
Chloride: 110 mmol/L — ABNORMAL HIGH (ref 98–107)
Co2: 23 mmol/L (ref 21–32)
Co2: 21 mmol/L (ref 21–32)
Creatinine: 3.55 mg/dL — ABNORMAL HIGH (ref 0.60–1.30)
Creatinine: 3.33 mg/dL — ABNORMAL HIGH (ref 0.60–1.30)
EGFR (Non-African Amer.): 17 — ABNORMAL LOW
GFR CALC AF AMER: 20 — AB
GFR CALC AF AMER: 22 — AB
GFR CALC NON AF AMER: 19 — AB
Glucose: 130 mg/dL — ABNORMAL HIGH (ref 65–99)
Glucose: 102 mg/dL — ABNORMAL HIGH (ref 65–99)
OSMOLALITY: 286 (ref 275–301)
Osmolality: 291 (ref 275–301)
POTASSIUM: 5.5 mmol/L — AB (ref 3.5–5.1)
Potassium: 5 mmol/L (ref 3.5–5.1)
SODIUM: 135 mmol/L — AB (ref 136–145)
Sodium: 139 mmol/L (ref 136–145)

## 2013-12-15 LAB — CBC WITH DIFFERENTIAL/PLATELET
BASOS ABS: 0 10*3/uL (ref 0.0–0.1)
BASOS PCT: 0.6 %
Eosinophil #: 0.2 10*3/uL (ref 0.0–0.7)
Eosinophil %: 2.9 %
HCT: 28.9 % — AB (ref 40.0–52.0)
HGB: 9.2 g/dL — ABNORMAL LOW (ref 13.0–18.0)
LYMPHS PCT: 21.5 %
Lymphocyte #: 1.3 10*3/uL (ref 1.0–3.6)
MCH: 24.4 pg — AB (ref 26.0–34.0)
MCHC: 31.8 g/dL — ABNORMAL LOW (ref 32.0–36.0)
MCV: 77 fL — ABNORMAL LOW (ref 80–100)
MONO ABS: 0.6 x10 3/mm (ref 0.2–1.0)
Monocyte %: 9.3 %
NEUTROS PCT: 65.7 %
Neutrophil #: 4.1 10*3/uL (ref 1.4–6.5)
Platelet: 178 10*3/uL (ref 150–440)
RBC: 3.76 10*6/uL — ABNORMAL LOW (ref 4.40–5.90)
RDW: 17.2 % — ABNORMAL HIGH (ref 11.5–14.5)
WBC: 6.2 10*3/uL (ref 3.8–10.6)

## 2013-12-15 LAB — URINE CULTURE

## 2013-12-15 LAB — BASIC METABOLIC PANEL
Anion Gap: 7 (ref 7–16)
BUN: 49 mg/dL — ABNORMAL HIGH (ref 7–18)
Calcium, Total: 8.4 mg/dL — ABNORMAL LOW (ref 8.5–10.1)
Chloride: 110 mmol/L — ABNORMAL HIGH (ref 98–107)
Co2: 22 mmol/L (ref 21–32)
Creatinine: 3.2 mg/dL — ABNORMAL HIGH (ref 0.60–1.30)
EGFR (African American): 23 — ABNORMAL LOW
GFR CALC NON AF AMER: 20 — AB
Glucose: 121 mg/dL — ABNORMAL HIGH (ref 65–99)
Osmolality: 292 (ref 275–301)
Potassium: 4.7 mmol/L (ref 3.5–5.1)
Sodium: 139 mmol/L (ref 136–145)

## 2013-12-16 LAB — BASIC METABOLIC PANEL
Anion Gap: 7 (ref 7–16)
BUN: 35 mg/dL — AB (ref 7–18)
CALCIUM: 9.1 mg/dL (ref 8.5–10.1)
Chloride: 111 mmol/L — ABNORMAL HIGH (ref 98–107)
Co2: 24 mmol/L (ref 21–32)
Creatinine: 2.36 mg/dL — ABNORMAL HIGH (ref 0.60–1.30)
EGFR (African American): 33 — ABNORMAL LOW
GFR CALC NON AF AMER: 28 — AB
Glucose: 98 mg/dL (ref 65–99)
OSMOLALITY: 291 (ref 275–301)
Potassium: 5.1 mmol/L (ref 3.5–5.1)
SODIUM: 142 mmol/L (ref 136–145)

## 2013-12-20 ENCOUNTER — Ambulatory Visit: Payer: Self-pay | Admitting: Urology

## 2013-12-20 DIAGNOSIS — N200 Calculus of kidney: Secondary | ICD-10-CM | POA: Diagnosis not present

## 2013-12-20 DIAGNOSIS — Z466 Encounter for fitting and adjustment of urinary device: Secondary | ICD-10-CM | POA: Diagnosis not present

## 2013-12-20 DIAGNOSIS — I2510 Atherosclerotic heart disease of native coronary artery without angina pectoris: Secondary | ICD-10-CM | POA: Insufficient documentation

## 2013-12-20 DIAGNOSIS — N23 Unspecified renal colic: Secondary | ICD-10-CM | POA: Diagnosis not present

## 2013-12-23 DIAGNOSIS — I25810 Atherosclerosis of coronary artery bypass graft(s) without angina pectoris: Secondary | ICD-10-CM | POA: Diagnosis not present

## 2013-12-23 DIAGNOSIS — E119 Type 2 diabetes mellitus without complications: Secondary | ICD-10-CM | POA: Diagnosis not present

## 2013-12-23 DIAGNOSIS — I10 Essential (primary) hypertension: Secondary | ICD-10-CM | POA: Diagnosis not present

## 2013-12-25 DIAGNOSIS — N200 Calculus of kidney: Secondary | ICD-10-CM | POA: Diagnosis not present

## 2014-01-03 DIAGNOSIS — M25519 Pain in unspecified shoulder: Secondary | ICD-10-CM | POA: Diagnosis not present

## 2014-01-03 DIAGNOSIS — M6580 Other synovitis and tenosynovitis, unspecified site: Secondary | ICD-10-CM | POA: Diagnosis not present

## 2014-01-03 DIAGNOSIS — M109 Gout, unspecified: Secondary | ICD-10-CM | POA: Diagnosis not present

## 2014-01-14 DIAGNOSIS — N1330 Unspecified hydronephrosis: Secondary | ICD-10-CM | POA: Diagnosis not present

## 2014-01-14 DIAGNOSIS — N200 Calculus of kidney: Secondary | ICD-10-CM | POA: Diagnosis not present

## 2014-01-15 DIAGNOSIS — Z1331 Encounter for screening for depression: Secondary | ICD-10-CM | POA: Diagnosis not present

## 2014-01-15 DIAGNOSIS — I10 Essential (primary) hypertension: Secondary | ICD-10-CM | POA: Diagnosis not present

## 2014-01-15 DIAGNOSIS — I2510 Atherosclerotic heart disease of native coronary artery without angina pectoris: Secondary | ICD-10-CM | POA: Diagnosis not present

## 2014-01-15 DIAGNOSIS — IMO0001 Reserved for inherently not codable concepts without codable children: Secondary | ICD-10-CM | POA: Diagnosis not present

## 2014-01-15 DIAGNOSIS — Z79899 Other long term (current) drug therapy: Secondary | ICD-10-CM | POA: Diagnosis not present

## 2014-01-15 DIAGNOSIS — N200 Calculus of kidney: Secondary | ICD-10-CM | POA: Diagnosis not present

## 2014-01-20 DIAGNOSIS — R911 Solitary pulmonary nodule: Secondary | ICD-10-CM | POA: Diagnosis not present

## 2014-01-20 DIAGNOSIS — N200 Calculus of kidney: Secondary | ICD-10-CM | POA: Diagnosis not present

## 2014-01-20 DIAGNOSIS — N1330 Unspecified hydronephrosis: Secondary | ICD-10-CM | POA: Diagnosis not present

## 2014-01-20 DIAGNOSIS — R109 Unspecified abdominal pain: Secondary | ICD-10-CM | POA: Diagnosis not present

## 2014-01-24 DIAGNOSIS — N200 Calculus of kidney: Secondary | ICD-10-CM | POA: Diagnosis not present

## 2014-04-08 DIAGNOSIS — I25810 Atherosclerosis of coronary artery bypass graft(s) without angina pectoris: Secondary | ICD-10-CM | POA: Diagnosis not present

## 2014-04-08 DIAGNOSIS — I10 Essential (primary) hypertension: Secondary | ICD-10-CM | POA: Diagnosis not present

## 2014-05-06 DIAGNOSIS — I10 Essential (primary) hypertension: Secondary | ICD-10-CM | POA: Diagnosis not present

## 2014-05-06 DIAGNOSIS — I2510 Atherosclerotic heart disease of native coronary artery without angina pectoris: Secondary | ICD-10-CM | POA: Diagnosis not present

## 2014-05-06 DIAGNOSIS — I25810 Atherosclerosis of coronary artery bypass graft(s) without angina pectoris: Secondary | ICD-10-CM | POA: Diagnosis not present

## 2014-07-14 ENCOUNTER — Ambulatory Visit: Payer: Self-pay | Admitting: Family Medicine

## 2014-07-14 DIAGNOSIS — M1A9XX0 Chronic gout, unspecified, without tophus (tophi): Secondary | ICD-10-CM | POA: Diagnosis not present

## 2014-07-14 DIAGNOSIS — M659 Synovitis and tenosynovitis, unspecified: Secondary | ICD-10-CM | POA: Diagnosis not present

## 2014-07-14 DIAGNOSIS — M25531 Pain in right wrist: Secondary | ICD-10-CM | POA: Diagnosis not present

## 2014-07-14 DIAGNOSIS — M109 Gout, unspecified: Secondary | ICD-10-CM | POA: Diagnosis not present

## 2014-07-14 DIAGNOSIS — I2510 Atherosclerotic heart disease of native coronary artery without angina pectoris: Secondary | ICD-10-CM | POA: Diagnosis not present

## 2014-07-14 DIAGNOSIS — S6991XA Unspecified injury of right wrist, hand and finger(s), initial encounter: Secondary | ICD-10-CM | POA: Diagnosis not present

## 2014-07-14 DIAGNOSIS — T148 Other injury of unspecified body region: Secondary | ICD-10-CM | POA: Diagnosis not present

## 2014-08-01 DIAGNOSIS — I25700 Atherosclerosis of coronary artery bypass graft(s), unspecified, with unstable angina pectoris: Secondary | ICD-10-CM | POA: Diagnosis not present

## 2014-08-01 DIAGNOSIS — E782 Mixed hyperlipidemia: Secondary | ICD-10-CM | POA: Diagnosis not present

## 2014-08-01 DIAGNOSIS — I10 Essential (primary) hypertension: Secondary | ICD-10-CM | POA: Diagnosis not present

## 2014-08-01 DIAGNOSIS — E119 Type 2 diabetes mellitus without complications: Secondary | ICD-10-CM | POA: Diagnosis not present

## 2014-08-07 ENCOUNTER — Ambulatory Visit: Payer: Self-pay | Admitting: Cardiology

## 2014-08-07 DIAGNOSIS — Z951 Presence of aortocoronary bypass graft: Secondary | ICD-10-CM | POA: Diagnosis not present

## 2014-08-07 DIAGNOSIS — E785 Hyperlipidemia, unspecified: Secondary | ICD-10-CM | POA: Diagnosis not present

## 2014-08-07 DIAGNOSIS — Z885 Allergy status to narcotic agent status: Secondary | ICD-10-CM | POA: Diagnosis not present

## 2014-08-07 DIAGNOSIS — I25709 Atherosclerosis of coronary artery bypass graft(s), unspecified, with unspecified angina pectoris: Secondary | ICD-10-CM | POA: Diagnosis not present

## 2014-08-07 DIAGNOSIS — E669 Obesity, unspecified: Secondary | ICD-10-CM | POA: Diagnosis not present

## 2014-08-07 DIAGNOSIS — K219 Gastro-esophageal reflux disease without esophagitis: Secondary | ICD-10-CM | POA: Diagnosis not present

## 2014-08-07 DIAGNOSIS — E119 Type 2 diabetes mellitus without complications: Secondary | ICD-10-CM | POA: Diagnosis not present

## 2014-08-07 DIAGNOSIS — I25119 Atherosclerotic heart disease of native coronary artery with unspecified angina pectoris: Secondary | ICD-10-CM | POA: Diagnosis not present

## 2014-08-07 DIAGNOSIS — I200 Unstable angina: Secondary | ICD-10-CM | POA: Diagnosis not present

## 2014-08-07 DIAGNOSIS — I25110 Atherosclerotic heart disease of native coronary artery with unstable angina pectoris: Secondary | ICD-10-CM | POA: Diagnosis not present

## 2014-08-07 DIAGNOSIS — Z8249 Family history of ischemic heart disease and other diseases of the circulatory system: Secondary | ICD-10-CM | POA: Diagnosis not present

## 2014-08-07 DIAGNOSIS — G4730 Sleep apnea, unspecified: Secondary | ICD-10-CM | POA: Diagnosis not present

## 2014-08-07 DIAGNOSIS — I10 Essential (primary) hypertension: Secondary | ICD-10-CM | POA: Diagnosis not present

## 2014-08-07 LAB — BASIC METABOLIC PANEL
ANION GAP: 9 (ref 7–16)
BUN: 25 mg/dL — AB (ref 7–18)
CHLORIDE: 105 mmol/L (ref 98–107)
CO2: 26 mmol/L (ref 21–32)
CREATININE: 1.69 mg/dL — AB (ref 0.60–1.30)
Calcium, Total: 9.7 mg/dL (ref 8.5–10.1)
GFR CALC AF AMER: 53 — AB
GFR CALC NON AF AMER: 44 — AB
GLUCOSE: 147 mg/dL — AB (ref 65–99)
Osmolality: 286 (ref 275–301)
Potassium: 4.5 mmol/L (ref 3.5–5.1)
Sodium: 140 mmol/L (ref 136–145)

## 2014-08-08 LAB — BASIC METABOLIC PANEL
ANION GAP: 9 (ref 7–16)
BUN: 22 mg/dL — AB (ref 7–18)
CHLORIDE: 107 mmol/L (ref 98–107)
Calcium, Total: 8.4 mg/dL — ABNORMAL LOW (ref 8.5–10.1)
Co2: 24 mmol/L (ref 21–32)
Creatinine: 1.55 mg/dL — ABNORMAL HIGH (ref 0.60–1.30)
EGFR (Non-African Amer.): 48 — ABNORMAL LOW
GFR CALC AF AMER: 59 — AB
Glucose: 156 mg/dL — ABNORMAL HIGH (ref 65–99)
Osmolality: 286 (ref 275–301)
Potassium: 4 mmol/L (ref 3.5–5.1)
SODIUM: 140 mmol/L (ref 136–145)

## 2014-08-08 LAB — CK-MB: CK-MB: 4.4 ng/mL — AB (ref 0.5–3.6)

## 2014-08-14 DIAGNOSIS — I25810 Atherosclerosis of coronary artery bypass graft(s) without angina pectoris: Secondary | ICD-10-CM | POA: Diagnosis not present

## 2014-08-14 DIAGNOSIS — I10 Essential (primary) hypertension: Secondary | ICD-10-CM | POA: Diagnosis not present

## 2014-08-14 DIAGNOSIS — I2510 Atherosclerotic heart disease of native coronary artery without angina pectoris: Secondary | ICD-10-CM | POA: Diagnosis not present

## 2014-08-14 DIAGNOSIS — E119 Type 2 diabetes mellitus without complications: Secondary | ICD-10-CM | POA: Diagnosis not present

## 2014-09-16 ENCOUNTER — Inpatient Hospital Stay: Payer: Self-pay | Admitting: Internal Medicine

## 2014-09-16 LAB — BASIC METABOLIC PANEL
ANION GAP: 9 (ref 7–16)
BUN: 22 mg/dL — AB (ref 7–18)
Calcium, Total: 9.7 mg/dL (ref 8.5–10.1)
Chloride: 106 mmol/L (ref 98–107)
Co2: 23 mmol/L (ref 21–32)
Creatinine: 1.87 mg/dL — ABNORMAL HIGH (ref 0.60–1.30)
EGFR (African American): 47 — ABNORMAL LOW
EGFR (Non-African Amer.): 39 — ABNORMAL LOW
GLUCOSE: 278 mg/dL — AB (ref 65–99)
OSMOLALITY: 289 (ref 275–301)
POTASSIUM: 4.5 mmol/L (ref 3.5–5.1)
SODIUM: 138 mmol/L (ref 136–145)

## 2014-09-16 LAB — TROPONIN I
Troponin-I: 0.22 ng/mL — ABNORMAL HIGH
Troponin-I: 21 ng/mL — ABNORMAL HIGH
Troponin-I: 30 ng/mL — ABNORMAL HIGH
Troponin-I: 30 ng/mL — ABNORMAL HIGH
Troponin-I: 20 ng/mL — ABNORMAL HIGH

## 2014-09-16 LAB — CK-MB
CK-MB: 5.3 ng/mL — ABNORMAL HIGH (ref 0.5–3.6)
CK-MB: 70.3 ng/mL — ABNORMAL HIGH (ref 0.5–3.6)
CK-MB: 79.5 ng/mL — ABNORMAL HIGH (ref 0.5–3.6)
CK-MB: 87.4 ng/mL — ABNORMAL HIGH (ref 0.5–3.6)
CK-MB: 59.9 ng/mL — ABNORMAL HIGH (ref 0.5–3.6)

## 2014-09-16 LAB — CBC
HCT: 36 % — ABNORMAL LOW (ref 40.0–52.0)
HGB: 11.5 g/dL — ABNORMAL LOW (ref 13.0–18.0)
MCH: 24.7 pg — AB (ref 26.0–34.0)
MCHC: 32.1 g/dL (ref 32.0–36.0)
MCV: 77 fL — ABNORMAL LOW (ref 80–100)
PLATELETS: 240 10*3/uL (ref 150–440)
RBC: 4.67 10*6/uL (ref 4.40–5.90)
RDW: 17.5 % — ABNORMAL HIGH (ref 11.5–14.5)
WBC: 10.3 10*3/uL (ref 3.8–10.6)

## 2014-09-16 LAB — APTT: Activated PTT: 34.3 secs (ref 23.6–35.9)

## 2014-09-16 LAB — PROTIME-INR
INR: 1.1
Prothrombin Time: 14.1 secs (ref 11.5–14.7)

## 2014-09-16 LAB — HEPARIN LEVEL (UNFRACTIONATED)
Anti-Xa(Unfractionated): <0.1 IU/mL — ABNORMAL LOW (ref 0.30–0.70)
Anti-Xa(Unfractionated): 0.13 IU/mL — ABNORMAL LOW (ref 0.30–0.70)

## 2014-09-17 LAB — TSH: Thyroid Stimulating Horm: 1.3 u[IU]/mL

## 2014-09-17 LAB — BASIC METABOLIC PANEL
Anion Gap: 8 (ref 7–16)
BUN: 26 mg/dL — ABNORMAL HIGH (ref 7–18)
Calcium, Total: 9.1 mg/dL (ref 8.5–10.1)
Chloride: 106 mmol/L (ref 98–107)
Co2: 26 mmol/L (ref 21–32)
Creatinine: 1.7 mg/dL — ABNORMAL HIGH (ref 0.60–1.30)
EGFR (African American): 53 — ABNORMAL LOW
EGFR (Non-African Amer.): 43 — ABNORMAL LOW
Glucose: 122 mg/dL — ABNORMAL HIGH (ref 65–99)
Osmolality: 285 (ref 275–301)
Potassium: 4.6 mmol/L (ref 3.5–5.1)
Sodium: 140 mmol/L (ref 136–145)

## 2014-09-17 LAB — CBC WITH DIFFERENTIAL/PLATELET
Basophil #: 0.1 10*3/uL (ref 0.0–0.1)
Basophil %: 0.6 %
Eosinophil #: 0.1 10*3/uL (ref 0.0–0.7)
Eosinophil %: 1.2 %
HCT: 32.9 % — ABNORMAL LOW (ref 40.0–52.0)
HGB: 10.6 g/dL — ABNORMAL LOW (ref 13.0–18.0)
Lymphocyte #: 2.1 10*3/uL (ref 1.0–3.6)
Lymphocyte %: 22.3 %
MCH: 24.7 pg — ABNORMAL LOW (ref 26.0–34.0)
MCHC: 32.1 g/dL (ref 32.0–36.0)
MCV: 77 fL — ABNORMAL LOW (ref 80–100)
Monocyte #: 0.7 x10 3/mm (ref 0.2–1.0)
Monocyte %: 7.1 %
Neutrophil #: 6.4 10*3/uL (ref 1.4–6.5)
Neutrophil %: 68.8 %
Platelet: 207 10*3/uL (ref 150–440)
RBC: 4.27 10*6/uL — ABNORMAL LOW (ref 4.40–5.90)
RDW: 17.4 % — ABNORMAL HIGH (ref 11.5–14.5)
WBC: 9.3 10*3/uL (ref 3.8–10.6)

## 2014-09-17 LAB — PROTIME-INR
INR: 1.2
Prothrombin Time: 14.8 secs — ABNORMAL HIGH (ref 11.5–14.7)

## 2014-09-17 LAB — APTT: Activated PTT: 57.5 secs — ABNORMAL HIGH (ref 23.6–35.9)

## 2014-09-17 LAB — HEPARIN LEVEL (UNFRACTIONATED): Anti-Xa(Unfractionated): 0.29 IU/mL — ABNORMAL LOW (ref 0.30–0.70)

## 2014-12-09 NOTE — Op Note (Signed)
PATIENT NAME:  Joshua Larsen, Joshua Larsen MR#:  [id number] DATE OF BIRTH:  12/15/1950  DATE OF PROCEDURE:  08/02/2012  PREOPERATIVE DIAGNOSIS: Bilateral ptosis.   POSTOPERATIVE DIAGNOSIS: Bilateral ptosis.   PROCEDURE: Bilateral ptosis repair (Fasanella-Servat technique)   SURGEON: Zackery Barefoot, MD    DESCRIPTION OF PROCEDURE: The patient was brought back to the operating room and placed in the supine position on the operating room table. After general LMA anesthesia had been induced, the patient was turned 90 degrees counterclockwise from anesthesia and the eyes were locally anesthetized with topical proparacaine. The right eye was addressed first. A corneal shield was placed. The eyelid was everted over the Desmarres retractor. Local anesthesia was injected in the supratarsal conjunctiva and Mueller's muscle. The 5-0 silk suture was placed as a retraction suture. The Putterman clamp was then used to gather conjunctiva and Mueller's muscle. A double-armed fast absorbing gut on a S14 needle was used to place the initial horizontal mattress sutures. A 15 blade was used to excise the Mueller's muscle and conjunctiva. The second row of sutures was placed. During placement of the second row of sutures, the suture needle was inadvertently stuck into the left index finger of the surgeon. The needle was then cut off. Surgeon re-sterilized the hands and the second side of the suture needle was used to throw the remaining sutures. The knot was then tied medially on that side An identical procedure was performed on the left side except for the second row of sutures was tied laterally in the usual fashion. TobraDex ointment was placed under the upper eyelid. Iced gauze was placed. The patient was returned to anesthesia, allowed to emerge from anesthesia in the operating room, and taken to the recovery room in stable condition.   Incidentally, there was no rubbing alcohol in the operating room and, therefore, there was  a delay in application of alcohol to the index finger of the surgeon. This was reported.  ____________________________ J. Gertie Baron, MD [id number] D: 08/02/2012 19:39:31 ET T: 08/03/2012 08:59:56 ET JOB#: [id number] cc: Zackery Barefoot, MD, <Dictator> Wendee Copp MD ELECTRONICALLY SIGNED 08/10/2012 11:40

## 2014-12-13 NOTE — Consult Note (Signed)
PATIENT NAME:  Joshua Larsen, Joshua Larsen MR#:  [id number] DATE OF BIRTH:  1950-08-31  DATE OF CONSULTATION:  10/09/2013  REFERRING PHYSICIAN:   CONSULTING PHYSICIAN:  Marcina Millard, MD  PRIMARY CARDIOLOGIST: Dr. Lady Gary.   PRIMARY CARE PHYSICIAN: Dr. Sullivan Lone.  CHIEF COMPLAINT: Chest pain.   REASON FOR CONSULTATION: Consultation requested for evaluation of chest pain.   HISTORY OF PRESENT ILLNESS: The patient is a 64 year old gentleman with known history of coronary artery disease, status post bypass graft surgery in 2000 at [facility]. The patient has been followed by Dr. Lady Gary. He apparently underwent cardiac catheterization in 2009 following knee surgery and apparently had a stenosis that was not amenable to PCI or redo surgery. The patient reports that he has been in his usual state of health, somewhat sedentary with a history of sleep apnea on CPAP with a several week history of generalized fatigue. The night before admission, the patient started to experience mid sternal sharp discomfort, which radiated to his mid back, which waxed and waned throughout the night. The patient reports that he was too uncomfortable to sleep. This morning, he presented to [facility] Emergency Room where EKG revealed sinus rhythm with nonspecific intraventricular conduction delay with evidence of less than 1 mm ST depression in the lateral leads. Initial troponin was negative The patient was being considered for transfer to [facility] for possible STEMI. Per cardiology fellow at [facility] request, CT scan with contrast was performed, which did not reveal evidence for PE or aortic dissection. The patient's chest pain has gradually resolved.   PAST MEDICAL HISTORY:  1.  Status post coronary artery bypass graft surgery. 2.  Diabetes.  3.  Hypertension.  4.  Gastroesophageal reflux disease.  5.  Hyperlipidemia.  6.  Sleep apnea.   MEDICATIONS:  Aspirin 81 mg daily, Diovan 160 mg daily, Nexium 40 mg daily, metoprolol succinate 1 daily,  metformin 1000 mg b.i.d. and fenofibrate 150 mg daily.   SOCIAL HISTORY: The patient is divorced. He lives alone. He has 1 son. He has never smoked.   FAMILY HISTORY: Positive for coronary artery disease.   REVIEW OF SYSTEMS: CONSTITUTIONAL: No fever or chills. EYES: No blurry vision.  EARS: No hearing loss. RESPIRATORY: No shortness of breath. CARDIOVASCULAR: Left-sided to mid sternal chest discomfort, which is sharp in nature, radiating to the mid back. ABDOMEN: Soft and nontender without hepatosplenomegaly. EXTREMITIES: No cyanosis, clubbing, or edema. Pulses were intact bilaterally. MUSCULOSKELETAL: Normal muscle tone. NEUROLOGIC: The patient is alert and oriented x 3. Motor and sensory both grossly intact.   IMPRESSION: A 64 year old gentleman with known coronary artery disease, status post bypass graft surgery, who presents with chest pain with typical and atypical features, EKG is nondiagnostic with baseline nonspecific intraventricular conduction delay. The patient has already undergone CT scan with contrast with chronic kidney disease at baseline.   RECOMMENDATIONS:  1.  I agree with overall current therapy.  2.  Heparin bolus and drip.  3.  Would defer cardiac catheterization today since the patient has already received IV contrast for CT scan. 4.  Tentatively scheduled for cardiac catheterization on 10/10/2013. The risks, benefits and alternatives were explained and informed written consent obtained. 5.  2D echocardiogram to evaluate left ventricular function.  6.  Continue to cycle cardiac enzymes.  ____________________________ Marcina Millard, MD ap:aw D: 10/09/2013 08:44:09 ET T: 10/09/2013 08:56:24 ET JOB#: [id number]  cc: Marcina Millard, MD, <Dictator> Marcina Millard MD ELECTRONICALLY SIGNED 10/25/2013 13:15

## 2014-12-13 NOTE — Consult Note (Signed)
Chief Complaint:  Subjective/Chief Complaint Patient says right flank pain is much better.  No problem voiding.  C/o dark urine. No nausea/chills/fevers   VITAL SIGNS/ANCILLARY NOTES: **Vital Signs.:   26-Apr-15 05:11  Vital Signs Type Routine  Temperature Temperature (F) 97.7  Celsius 36.5  Temperature Source oral  Pulse Pulse 54  Respirations Respirations 18  Systolic BP Systolic BP 643  Diastolic BP (mmHg) Diastolic BP (mmHg) 74  Mean BP 93  Pulse Ox % Pulse Ox % 96  Pulse Ox Activity Level  At rest  Oxygen Delivery Room Air/ 21 %    07:40  Vital Signs Type Routine  Temperature Temperature (F) 98  Celsius 36.6  Temperature Source oral  Pulse Pulse 65  Respirations Respirations 18  Systolic BP Systolic BP 329  Diastolic BP (mmHg) Diastolic BP (mmHg) 81  Mean BP 98  Pulse Ox % Pulse Ox % 98  Pulse Ox Activity Level  At rest  Oxygen Delivery Room Air/ 21 %  Telemetry pattern Cardiac Rhythm Normal sinus rhythm; pattern reported by Telemetry Clerk; 65  *Intake and Output.:   Daily 26-Apr-15 07:00  Grand Totals Intake:  2116 Output:  1800    Net:  316 24 Hr.:  316  Oral Intake      In:  0  IV (Primary)      In:  2066  IV (Secondary)      In:  50  Urine ml     Out:  1800  Length of Stay Totals Intake:  2116 Output:  1800    Net:  316   Brief Assessment:  GEN well developed, well nourished, no acute distress   Cardiac Regular   Respiratory normal resp effort   Gastrointestinal Normal   Gastrointestinal details normal Soft  Nontender  Nondistended   EXTR negative cyanosis/clubbing   Additional Physical Exam urine- burgundy, no clots   Lab Results: Hepatic:  24-Apr-15 11:59   Bilirubin, Total 0.8  Alkaline Phosphatase  32 (45-117 NOTE: New Reference Range 07/12/13)  SGPT (ALT) 21  SGOT (AST) 15  Total Protein, Serum  8.5  Albumin, Serum 3.8  Routine Micro:  24-Apr-15 12:00   Micro Text Report URINE CULTURE   COMMENT                   NO GROWTH  IN 36 HOURS   ANTIBIOTIC                       Specimen Source CLEAN CATCH  Culture Comment NO GROWTH IN 36 HOURS  Result(s) reported on 15 Dec 2013 at 10:01AM.  Routine Chem:  24-Apr-15 11:59   Glucose, Serum  123  BUN  48  Creatinine (comp)  3.52  Sodium, Serum  134  Potassium, Serum 4.7  Chloride, Serum 102  CO2, Serum 25  Calcium (Total), Serum 9.8  Anion Gap 7  Osmolality (calc) 282  eGFR (African American)  20  eGFR (Non-African American)  18 (eGFR values <49mL/min/1.73 m2 may be an indication of chronic kidney disease (CKD). Calculated eGFR is useful in patients with stable renal function. The eGFR calculation will not be reliable in acutely ill patients when serum creatinine is changing rapidly. It is not useful in  patients on dialysis. The eGFR calculation may not be applicable to patients at the low and high extremes of body sizes, pregnant women, and vegetarians.)  25-Apr-15 05:10   Glucose, Serum  130  BUN  52  Creatinine (  comp)  3.55  Sodium, Serum  135  Potassium, Serum 5.0  Chloride, Serum 105  CO2, Serum 23  Calcium (Total), Serum 8.9  Anion Gap 7  Osmolality (calc) 286  eGFR (African American)  20  eGFR (Non-African American)  17 (eGFR values <69mL/min/1.73 m2 may be an indication of chronic kidney disease (CKD). Calculated eGFR is useful in patients with stable renal function. The eGFR calculation will not be reliable in acutely ill patients when serum creatinine is changing rapidly. It is not useful in  patients on dialysis. The eGFR calculation may not be applicable to patients at the low and high extremes of body sizes, pregnant women, and vegetarians.)    20:18   Glucose, Serum  102  BUN  51  Creatinine (comp)  3.33  Sodium, Serum 139  Potassium, Serum  5.5  Chloride, Serum  110  CO2, Serum 21  Calcium (Total), Serum 8.6  Anion Gap 8  Osmolality (calc) 291  eGFR (African American)  22  eGFR (Non-African American)  19 (eGFR values  <58mL/min/1.73 m2 may be an indication of chronic kidney disease (CKD). Calculated eGFR is useful in patients with stable renal function. The eGFR calculation will not be reliable in acutely ill patients when serum creatinine is changing rapidly. It is not useful in  patients on dialysis. The eGFR calculation may not be applicable to patients at the low and high extremes of body sizes, pregnant women, and vegetarians.)  Result Comment POTASSIUM - Slight hemolysis, interpret results with  - caution.  Result(s) reported on 14 Dec 2013 at 08:43PM.  26-Apr-15 06:31   Glucose, Serum  121  BUN  49  Creatinine (comp)  3.20  Sodium, Serum 139  Potassium, Serum 4.7  Chloride, Serum  110  CO2, Serum 22  Calcium (Total), Serum  8.4  Anion Gap 7  Osmolality (calc) 292  eGFR (African American)  23  eGFR (Non-African American)  20 (eGFR values <16mL/min/1.73 m2 may be an indication of chronic kidney disease (CKD). Calculated eGFR is useful in patients with stable renal function. The eGFR calculation will not be reliable in acutely ill patients when serum creatinine is changing rapidly. It is not useful in  patients on dialysis. The eGFR calculation may not be applicable to patients at the low and high extremes of body sizes, pregnant women, and vegetarians.)  Routine UA:  24-Apr-15 12:00   Color (UA) Yellow  Clarity (UA) Clear  Glucose (UA) Negative  Bilirubin (UA) Negative  Ketones (UA) Negative  Specific Gravity (UA) 1.014  Blood (UA) Negative  pH (UA) 6.0  Protein (UA) Negative  Nitrite (UA) Negative  Leukocyte Esterase (UA) Negative (Result(s) reported on 13 Dec 2013 at 12:32PM.)  RBC (UA) <1 /HPF  WBC (UA) 1 /HPF  Bacteria (UA) NONE SEEN  Epithelial Cells (UA) <1 /HPF (Result(s) reported on 13 Dec 2013 at 12:32PM.)  Routine Hem:  24-Apr-15 11:59   WBC (CBC)  12.1  RBC (CBC) 4.49  Hemoglobin (CBC)  10.9  Hematocrit (CBC)  34.0  Platelet Count (CBC) 224  MCV  76  MCH   24.3  MCHC 32.0  RDW  17.6  Neutrophil % 76.2  Lymphocyte % 11.8  Monocyte % 9.5  Eosinophil % 1.1  Basophil % 1.4  Neutrophil #  9.2  Lymphocyte # 1.4  Monocyte #  1.1  Eosinophil # 0.1  Basophil #  0.2 (Result(s) reported on 13 Dec 2013 at 12:27PM.)  25-Apr-15 05:10   WBC (CBC)  10.7  RBC (CBC)  3.91  Hemoglobin (CBC)  9.5  Hematocrit (CBC)  30.0  Platelet Count (CBC) 204  MCV  77  MCH  24.4  MCHC  31.8  RDW  17.4  Neutrophil % 79.6  Lymphocyte % 10.4  Monocyte % 7.7  Eosinophil % 1.7  Basophil % 0.6  Neutrophil #  8.5  Lymphocyte # 1.1  Monocyte # 0.8  Eosinophil # 0.2  Basophil # 0.1 (Result(s) reported on 14 Dec 2013 at 06:27AM.)  26-Apr-15 06:31   WBC (CBC) 6.2  RBC (CBC)  3.76  Hemoglobin (CBC)  9.2  Hematocrit (CBC)  28.9  Platelet Count (CBC) 178  MCV  77  MCH  24.4  MCHC  31.8  RDW  17.2  Neutrophil % 65.7  Lymphocyte % 21.5  Monocyte % 9.3  Eosinophil % 2.9  Basophil % 0.6  Neutrophil # 4.1  Lymphocyte # 1.3  Monocyte # 0.6  Eosinophil # 0.2  Basophil # 0.0 (Result(s) reported on 15 Dec 2013 at 06:59AM.)   Radiology Results: CT:    24-Apr-15 14:05, CT Abdomen Pelvis WO for Stone  CT Abdomen Pelvis WO for Stone   REASON FOR EXAM:    hx renal stones, new onset renal failure, ?   obstructing stone  COMMENTS:       PROCEDURE: CT  - CT ABDOMEN /PELVIS WO (STONE)  - Dec 13 2013  2:05PM     CLINICAL DATA:  History of stones.  Right flank pain.  Hematuria.    EXAM:  CT ABDOMEN AND PELVIS WITHOUT CONTRAST    TECHNIQUE:  Multidetector CT imaging of the abdomen and pelvis was performed  following the standard protocol without IV contrast.  COMPARISON:  CTA chest abdomen and pelvis 10/09/2013. CT abdomen and  pelvis04/07/2012.    FINDINGS:  The lung bases are clear without focal nodule, mass or airspace  disease. The heart size is normal. No significant pleural or  pericardial effusion is present. Coronary artery calcifications  are  present.    The liver and spleen are within normal limits. The stomach,  duodenum, and pancreas are within normal limits. The common bile  duct and gallbladder are normal. The adrenal glands are normal  bilaterally.    Moderate right-sided hydronephrosis is present. Right ureteris  dilated with inflammatory changes to level of an obstructing 5.5 mm  stone in the distal right ureter. An additional nonobstructing stone  at the lower pole of the right kidney measures 5 mm.    A nonobstructing stone near the midportion of the left kidney  measures 3 mm. A stone at the lower pole of the left kidney measures  4.5 mm. A third stone measures 2.5 mm. Minimal stranding is present  about the left kidney. There is no evidence for obstruction. Pelvic  phleboliths are present. Vascular calcifications are present as  well.    The rectosigmoid colon is within normal limits. The remainder the  colon is unremarkable. The appendix is visualized and normal. Small  bowel is within normal limits. Urinary bladder is unremarkable.  The bone windows demonstrate no focal lytic or blastic lesions.  Facet degenerative changes are most evident at L4-5 and L5-S1.     IMPRESSION:  1. Obstructing 5.5 mm stone in the distal right ureter with moderate  right-sided hydroureteronephrosis.  2. Additional 5 mm nonobstructing stone at the lower pole of the  right kidney.  3. At least 3 nonobstructing stones in the left kidney measure up  to  4.5 mm.  4. Atherosclerotic disease including coronary artery calcifications.  5. Moderate degenerative change in the lower lumbar spine.      Electronically Signed    By: Lawrence Santiago M.D.    On: 12/13/2013 16:19         Verified By: Resa Miner. MATTERN, M.D.,   Assessment/Plan:  Invasive Device Daily Assessment of Necessity:  Does the patient currently have any of the following indwelling devices? none   Assessment/Plan:  Assessment 64 yo male with right  distal obstructing ureteral stone s/p right ureteral stent placement on 12/14/13.   Plan 1. Pain- Improved after stent placement.  Continue narcotic prn 2. Acute on chronic renal insufficiency- Creatinine trending downward.  Avoid nephrotoxic agents. Will need f/u with PCP to follow creatinine 3. Nephrolithiasis- Patient will need definitive treatment for stone.  If patient is discharged, instructions will be given for f/u with outpatient urology. Continue flomax for stent discomfort  Urine culture sent from OR.  If this is positive, patient will need abx as outpatient prior to definitive surgical treatment.  Discussed above with patient and all questions answered   Electronic Signatures: Margy Clarks (MD)  (Signed 26-Apr-15 10:16)  Authored: Chief Complaint, VITAL SIGNS/ANCILLARY NOTES, Brief Assessment, Lab Results, Radiology Results, Assessment/Plan   Last Updated: 26-Apr-15 10:16 by Margy Clarks (MD)

## 2014-12-13 NOTE — H&P (Signed)
PATIENT NAME:  Joshua Larsen, Joshua Larsen MR#:  [id number] DATE OF BIRTH:  Jun 03, 1951  DATE OF ADMISSION:  12/13/2013  PRIMARY CARE PHYSICIAN:  Dr. Julieanne Manson.  PRIMARY CARDIOLOGIST:  Dr. Lady Gary.   EMERGENCY ROOM PHYSICIAN: Dr. Darnelle Catalan.   CHIEF COMPLAINT: Right flank pain.   HISTORY OF PRESENT ILLNESS: This patient is a 64 year old male with history of kidney stones, had several of them before, has this right flank pain for about a week. The patient brought in the Emergency Room a week ago, but discharged home. The patient's pain subsided initially, but came back again, and the patient found to have right ureteral stone and right-sided hydronephrosis. The patient denies any fever and denies any trouble urinating. The patient found  to have a BUN of 48 and creatinine 3.52 today. The patient's creatinine was 1.85 on April 19. The patient was seen by Dr. Achilles Dunk. He suggested that the patient most likely will pass the stone tonight; otherwise, the patient will have lithotripsy tomorrow.. The patient denies any complaints. He received a dose of Dilaudid in the Emergency Room and that helped him with his pain. The patient is not now symptomatic.   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  The patient has a history of diabetes mellitus type 2, hypertension, hyperlipidemia, GERD. Also includes history of multiple kidney stones, which he passed spontaneously and never had to have any stents placed. The patient says that he has a history of uric acid stones; follows up with Dr. Sheppard Penton.   PAST SURGICAL HISTORY: Significant for:  1,  Bypass surgery ( 2000.  2.  Knee surgery for total knee replacement.  3.  Right upper extremity surgery.  4.  Pilonidal cyst removal.  5.   FAMILY HISTORY: No hypertension or diabetes.   SOCIAL HISTORY: No smoking. No drinking. Lives by himself and independent of activities of daily living.     ALLERGIES: HE IS ALLERGIC  TO CODEINE AND ALSO AMOXICILLIN.   HOME MEDICATIONS: Include: 1.  Hydrochlorothiazide with Valsartan 25/160 mg p.o. in the morning.  2.  Metoprolol succinate 100 mg p.o. at bedtime.  3.  Niacin 500 mg p.o. daily.  4.  Nitro sublingual p.r.n. for chest pain.  5.  Omega-3 fatty acids 2 grams once a day.  6.  Oxycodone 5 mg p.o. every 6 hours for pain.  7.  Zantac 150 mg p.o. daily.  8.  Aspirin 81 mg daily.  9.  Atorvastatin 20 mg p.o. daily.  10.  The patient was given Cipro 500 mg twice daily on 19th of April.  11.  The patient also takes Januvia.   REVIEW OF SYSTEMS: CONSTITUTIONAL: No fever. No fatigue.  EYES: No blurred vision.  ENT: No tinnitus. No epistaxis. No difficulty swallowing.  RESPIRATORY: No cough. No wheezing.  CARDIOVASCULAR: No chest pain. No orthopnea. No pedal edema.  GASTROINTESTINAL: The patient feels poor appetite for the last few days. Denies any nausea or vomiting. He has constipation. Last BM was a week ago.  GENITOURINARY: No dysuria. No hematuria. No frequency. The patient has a history of kidney stones.  ENDOCRINE: No polyuria or nocturia.  INTEGUMENTARY: No skin rashes.  MUSCULOSKELETAL: No joint pains.  NEUROLOGIC: No history of strokes or TIAs.  PSYCHIATRIC: No anxiety or insomnia.   PHYSICAL EXAMINATION:  VITAL SIGNS:  Temperature 98.2, heart rate 75, blood pressure 140/85, sats 98% on room air.  GENERAL: He is alert, awake, oriented, very  pleasant 64 year old male, not in distress, answering questions appropriately.  HEAD: Atraumatic, normocephalic.  EYES: Pupils equally reacting to light. No scleral icterus.  NOSE: No nasal lesions. No drainage.  EARS: No tympanic membrane congestion.  MOUTH: No lesions. No exudates, but clinically, mucosa is dry.  NECK: Supple. No JVD. No carotid bruit. Normal range of motion.  RESPIRATORY: Good respiratory effort. Clear to auscultation. No wheeze. No rales.  CARDIOVASCULAR: Bypass scar present on chest wall.  He is slightly bradycardic with heart rate in the 40s during my visit. Otherwise, no murmurs. The patient's PMI not displaced. Pulses equal in carotid and femoral arteries. No peripheral edema.  GASTROINTESTINAL: Abdomen is soft, nontender. The patient does not have CVA tenderness and no hernias. His bowel sounds are present in all quadrants. The patient has no rigidity.  MUSCULOSKELETAL: The patient is able to move extremities x 4.  SKIN INSPECTION: Decreased skin turgor. No diaphoresis. No obvious wounds.  LYMPHATICS: No lymphadenopathy.  VASCULAR: Good pedal pulses.  NEUROLOGIC: Cranial nerves II XII are intact. Power 5/5 in upper and lower extremities. Sensation is intact. DTRs 2+ bilaterally.  PSYCHIATRIC: Mood and affect are within normal limits.   LABORATORY, DIAGNOSTIC AND RADIOLOGICAL DATA: CT abdomen showed obstructing 5 mm stone in the distal right ureter with moderate hydronephrosis, additional 5 mm nonobstructing stone in the lower pole of right kidney and 3 nonobstructing stones in left kidney of about 4.5 mm, moderate degenerative disease in the lumbar spine. UA is clear. No leukocyte esterase. WBC 12.1, hemoglobin 10.9, hematocrit 34, platelets 224.   ELECTROLYTES: Sodium 134, potassium 4.7, chloride 102, bicarb 25, BUN 48, creatinine 3.52, glucose 123.   ASSESSMENT AND PLAN: The patient is a 64 year old male patient with:  1.  Right flank pain, found to have right-sided  hydronephrosis and right ureteral stone.  The patient is going to be admitted to med/surgery tele.  The patient was seen by Dr. Achilles Dunk. He suggested the patient can have stone retrieval with shockwave tomorrow morning.  He is going to come at 7:00 a.m. tomorrow, and if the on-call doctor, Dr. Alyce Pagan comes in at 7:00, at that time the patient can have lithotripsy and stone removal at the same time.  Otherwise, the patient needs a stent for ureteric stone.  The patient is going to be admitted. We are going to start  aggressive hydration and see if he can pass the stone without any intervention.   2.  History of kidney stones and uric acid stones before.  The needs further workup and strain the urine at this time.  The patient may need to go on allopurinol. Note that he is on hydrochlorothiazide.  As he had uric  acid stones, hydrochlorothiazide should be avoided. 3.  Hypertension. The patient is bradycardic, so hold the metoprolol and we will continue Norvasc if the BP goes up.   4.  Diabetes mellitus type 2.  Continue sliding scale coverage and also Januvia.  5.  Coronary artery disease bypass surgery. Continue aspirin, statins and Plavix and hold beta blockers because of bradycardia.  6.  Acute renal failure due to obstructive uropathy. The patient needs urological intervention. At this time, the patient is admitted to medical service.  We will start fluids, pain medications, and Dr. Achilles Dunk already saw the patient, and monitor kidney function and continue Flomax for him.   Time spent on this patient is about 60 minutes.    ____________________________ Katha Hamming, MD sk:dmm D: 12/13/2013 16:48:11 ET T: 12/13/2013 19:06:28  ET JOB#: [id number]  cc: Katha Hamming, MD, <Dictator> Katha Hamming MD ELECTRONICALLY SIGNED 01/15/2014 15:38

## 2014-12-13 NOTE — Op Note (Signed)
PATIENT NAME:  Joshua Larsen, Joshua Larsen MR#:  [id number] DATE OF BIRTH:  1951-07-08  DATE OF PROCEDURE:  12/14/2013  PREOPERATIVE DIAGNOSIS: Right obstructing distal ureteral stone.  POSTOPERATIVE DIAGNOSIS: Right obstructing distal ureteral stone.  PROCEDURES: 1.  Cystoscopy. 2.  Right retrograde pyelogram. 3.  Placement of right ureteral stent.  SURGEON: Amie Critchley, MD  ANESTHESIA: LMA.  ESTIMATED BLOOD LOSS: Minimal.  ANTIBIOTICS: Ceftriaxone on call to OR.  SPECIMENS: Urine culture sent to microbiology.  COMPLICATIONS: None.  INDICATION FOR PROCEDURE: Mr. Peot is a 64 year old gentleman with history of uric acid stones. He presented to the Emergency Department with pain and elevated creatinine. He was admitted for pain control and after cardiology clearance was taken to the operating room for above procedure. After discussing risks, benefits, and all possible complications, the patient signed informed consent.   DESCRIPTION OF PROCEDURE: After signing informed consent, the patient was brought back to the operating room and placed in a supine position. Anesthesia was induced without any difficulty. IV antibiotics were given prior to procedure. Once the patient was asleep, he was then placed in lithotomy position. He was prepped and draped in a sterile fashion.   Cystoscopy of the bladder revealed no abnormalities within the bladder. The right ureteral orifice was identified. This was cannulated with an open-ended 6-French ureteral catheter. A sensor guidewire was placed all the way up into the kidney and the open-ended catheter was advanced into the mid ureter. The sensor guidewire was then removed and using 50:50 Conray/saline, a right retrograde pyelogram was performed which showed massive hydronephrosis of the right collecting system. Sensor guidewire was then placed back into the renal pelvis and a coil was noted. The open-ended ureteral stent was removed.   Once this was done, a  6 x 26 double J ureteral stent was placed over the sensor guidewire through the cystoscope into the ureter. This was placed all the way up into the renal pelvis. Fluoroscopy was used for guidance. The sensor guidewire was then removed and a coil was noted both in the renal pelvis and was visualized in the bladder. The bladder was emptied. The cystoscope was removed.   The patient tolerated the procedure without any complications. He was awakened from anesthesia and transferred to the PACU.     ____________________________ Janit Bern, MD erm:sb D: 12/17/2013 11:55:00 ET T: 12/17/2013 13:04:18 ET JOB#: [id number]  cc: Janit Bern, MD, <Dictator> Janit Bern MD ELECTRONICALLY SIGNED 12/18/2013 13:49

## 2014-12-13 NOTE — Consult Note (Signed)
Chief Complaint:  Subjective/Chief Complaint Patient continues to have right flank pain radiating to the groin.  Has not passed the 19m distal ureteral stone. No nausea/vomiting.  No fevers/chills   VITAL SIGNS/ANCILLARY NOTES: **Vital Signs.:   25-Apr-15 03:56  Vital Signs Type Routine  Temperature Temperature (F) 98.5  Celsius 36.9  Temperature Source oral  Respirations Respirations 20  Systolic BP Systolic BP 1509 Diastolic BP (mmHg) Diastolic BP (mmHg) 65  Mean BP 76  Pulse Ox % Pulse Ox % 96  Pulse Ox Activity Level  At rest  Oxygen Delivery Room Air/ 21 %    07:19  Vital Signs Type Routine  Pulse Pulse 55  Respirations Respirations 18  Systolic BP Systolic BP 1326 Diastolic BP (mmHg) Diastolic BP (mmHg) 66  Mean BP 84  Pulse Ox % Pulse Ox % 98  Pulse Ox Activity Level  At rest  Oxygen Delivery Room Air/ 21 %   Brief Assessment:  GEN well developed, well nourished, no acute distress   Cardiac Regular   Respiratory normal resp effort   Gastrointestinal Normal   Gastrointestinal details normal Soft  Nontender   EXTR negative cyanosis/clubbing   Additional Physical Exam +Right CVA tenderness   Lab Results: Hepatic:  24-Apr-15 11:59   Bilirubin, Total 0.8  Alkaline Phosphatase  32 (45-117 NOTE: New Reference Range 07/12/13)  SGPT (ALT) 21  SGOT (AST) 15  Total Protein, Serum  8.5  Albumin, Serum 3.8  Routine Micro:  24-Apr-15 12:00   Micro Text Report URINE CULTURE   COMMENT                   NO GROWTH IN 8-12 HOURS   ANTIBIOTIC                       Specimen Source CLEAN CATCH  Culture Comment NO GROWTH IN 8-12 HOURS  Result(s) reported on 14 Dec 2013 at 10:03AM.  Routine Chem:  24-Apr-15 11:59   Glucose, Serum  123  BUN  48  Creatinine (comp)  3.52  Sodium, Serum  134  Potassium, Serum 4.7  Chloride, Serum 102  CO2, Serum 25  Calcium (Total), Serum 9.8  Anion Gap 7  Osmolality (calc) 282  eGFR (African American)  20  eGFR (Non-African  American)  18 (eGFR values <651mmin/1.73 m2 may be an indication of chronic kidney disease (CKD). Calculated eGFR is useful in patients with stable renal function. The eGFR calculation will not be reliable in acutely ill patients when serum creatinine is changing rapidly. It is not useful in  patients on dialysis. The eGFR calculation may not be applicable to patients at the low and high extremes of body sizes, pregnant women, and vegetarians.)  25-Apr-15 05:10   Glucose, Serum  130  BUN  52  Creatinine (comp)  3.55  Sodium, Serum  135  Potassium, Serum 5.0  Chloride, Serum 105  CO2, Serum 23  Calcium (Total), Serum 8.9  Anion Gap 7  Osmolality (calc) 286  eGFR (African American)  20  eGFR (Non-African American)  17 (eGFR values <6082min/1.73 m2 may be an indication of chronic kidney disease (CKD). Calculated eGFR is useful in patients with stable renal function. The eGFR calculation will not be reliable in acutely ill patients when serum creatinine is changing rapidly. It is not useful in  patients on dialysis. The eGFR calculation may not be applicable to patients at the low and high extremes of body sizes, pregnant women,  and vegetarians.)  Routine UA:  24-Apr-15 12:00   Color (UA) Yellow  Clarity (UA) Clear  Glucose (UA) Negative  Bilirubin (UA) Negative  Ketones (UA) Negative  Specific Gravity (UA) 1.014  Blood (UA) Negative  pH (UA) 6.0  Protein (UA) Negative  Nitrite (UA) Negative  Leukocyte Esterase (UA) Negative (Result(s) reported on 13 Dec 2013 at 12:32PM.)  RBC (UA) <1 /HPF  WBC (UA) 1 /HPF  Bacteria (UA) NONE SEEN  Epithelial Cells (UA) <1 /HPF (Result(s) reported on 13 Dec 2013 at 12:32PM.)  Routine Hem:  24-Apr-15 11:59   WBC (CBC)  12.1  RBC (CBC) 4.49  Hemoglobin (CBC)  10.9  Hematocrit (CBC)  34.0  Platelet Count (CBC) 224  MCV  76  MCH  24.3  MCHC 32.0  RDW  17.6  Neutrophil % 76.2  Lymphocyte % 11.8  Monocyte % 9.5  Eosinophil % 1.1   Basophil % 1.4  Neutrophil #  9.2  Lymphocyte # 1.4  Monocyte #  1.1  Eosinophil # 0.1  Basophil #  0.2 (Result(s) reported on 13 Dec 2013 at 12:27PM.)  25-Apr-15 05:10   WBC (CBC)  10.7  RBC (CBC)  3.91  Hemoglobin (CBC)  9.5  Hematocrit (CBC)  30.0  Platelet Count (CBC) 204  MCV  77  MCH  24.4  MCHC  31.8  RDW  17.4  Neutrophil % 79.6  Lymphocyte % 10.4  Monocyte % 7.7  Eosinophil % 1.7  Basophil % 0.6  Neutrophil #  8.5  Lymphocyte # 1.1  Monocyte # 0.8  Eosinophil # 0.2  Basophil # 0.1 (Result(s) reported on 14 Dec 2013 at 06:27AM.)   Radiology Results: CT:    24-Apr-15 14:05, CT Abdomen Pelvis WO for Stone  CT Abdomen Pelvis WO for Stone   REASON FOR EXAM:    hx renal stones, new onset renal failure, ?   obstructing stone  COMMENTS:       PROCEDURE: CT  - CT ABDOMEN /PELVIS WO (STONE)  - Dec 13 2013  2:05PM     CLINICAL DATA:  History of stones.  Right flank pain.  Hematuria.    EXAM:  CT ABDOMEN AND PELVIS WITHOUT CONTRAST    TECHNIQUE:  Multidetector CT imaging of the abdomen and pelvis was performed  following the standard protocol without IV contrast.  COMPARISON:  CTA chest abdomen and pelvis 10/09/2013. CT abdomen and  pelvis04/07/2012.    FINDINGS:  The lung bases are clear without focal nodule, mass or airspace  disease. The heart size is normal. No significant pleural or  pericardial effusion is present. Coronary artery calcifications are  present.    The liver and spleen are within normal limits. The stomach,  duodenum, and pancreas are within normal limits. The common bile  duct and gallbladder are normal. The adrenal glands are normal  bilaterally.    Moderate right-sided hydronephrosis is present. Right ureteris  dilated with inflammatory changes to level of an obstructing 5.5 mm  stone in the distal right ureter. An additional nonobstructing stone  at the lower pole of the right kidney measures 5 mm.    A nonobstructing stone near  the midportion of the left kidney  measures 3 mm. A stone at the lower pole of the left kidney measures  4.5 mm. A third stone measures 2.5 mm. Minimal stranding is present  about the left kidney. There is no evidence for obstruction. Pelvic  phleboliths are present. Vascular calcifications are present as  well.  The rectosigmoid colon is within normal limits. The remainder the  colon is unremarkable. The appendix is visualized and normal. Small  bowel is within normal limits. Urinary bladder is unremarkable.  The bone windows demonstrate no focal lytic or blastic lesions.  Facet degenerative changes are most evident at L4-5 and L5-S1.     IMPRESSION:  1. Obstructing 5.5 mm stone in the distal right ureter with moderate  right-sided hydroureteronephrosis.  2. Additional 5 mm nonobstructing stone at the lower pole of the  right kidney.  3. At least 3 nonobstructing stones in the left kidney measure up to  4.5 mm.  4. Atherosclerotic disease including coronary artery calcifications.  5. Moderate degenerative change in the lower lumbar spine.      Electronically Signed    By: Lawrence Santiago M.D.    On: 12/13/2013 16:19         Verified By: Resa Miner. MATTERN, M.D.,   Assessment/Plan:  Invasive Device Daily Assessment of Necessity:  Does the patient currently have any of the following indwelling devices? none   Assessment/Plan:  Assessment 64 yo male with obstructing 30m right distal ureteral stone.  Admitted for pain control and monitoring of elevated creatinine.  1. Pain- continue current management 2. AKI- creatinine remains elevated, will require stent placement in OR 3. Nephrolithisis- patient with obstructing stone.  Will require stent placement in OR for pain and elevated creatinine.  Patient seen by cardiology because of recent h/o MI.  Dr. FUbaldo Glassingagrees with proceeding to OR.  Appreciate cardiology input.  To OR this afternoon for cystoscopy, right retrograde  pyelogram and right ureteral stent placement.  Discussed with patient and consent signed in chart.  Abx on call to OR.  Will need f/u for definitive stone treatment.   Electronic Signatures: MMargy Clarks(MD)  (Signed 25-Apr-15 12:07)  Authored: Chief Complaint, VITAL SIGNS/ANCILLARY NOTES, Brief Assessment, Lab Results, Radiology Results, Assessment/Plan   Last Updated: 25-Apr-15 12:07 by MMargy Clarks(MD)

## 2014-12-13 NOTE — H&P (Signed)
PATIENT NAME:  Joshua Larsen, Joshua Larsen MR#:  [id number] DATE OF BIRTH:  11/20/1950  DATE OF ADMISSION:  10/09/2013  REASON FOR ADMISSION: Chest pain, acute coronary syndrome.   REFERRING PHYSICIAN: Dr. Lucrezia Europe.   CARDIOLOGIST: Dr. Lady Gary; cardiologist on the case today, Dr. Darrold Junker.   HISTORY OF PRESENT ILLNESS: This is a very nice 64 year old gentleman with history of coronary artery disease. He had bypass graft surgery in 2002 x 5 at [facility]. Since then, he has had some issues with chest pain. At some point, he had a catheterization in 2009 by Dr. Lady Gary, who found that there was a significant stenosis that was not able to be corrected with PCI or stent or surgery to be redone due to the risk. The patient has been complaining of chest pain with exertion. He said that he used to be able to walk about a football field, and then started having chest pain now. It has been progressing during the past 6 months to the point that he is having chest pain not on exertion, last night, but overall he says he can do a quarter of a football field and then he starts having pain, and that is within the last 2 weeks. The patient started having some chest pain last night around midnight. He could not sleep. He said that he tried to sleep to see if the pain went away. This morning, the pain was not gone, and he went into the shower, got really diaphoretic, got significant sharp chest pain, 10 out of 10, located in the middle of the chest, radiating to the arm with no improvement up until he got morphine and nitroglycerin, and the pain still continues. The patient was seen in the Emergency Department, and he has depression of the ST segment on the lateral leads and left bundle branch block. Cardiology was consulted. Dr. Darrold Junker saw the patient, and he is going to take him to catheterization. He was trying to do it today, but since the depression, had  a CT of the chest to rule out PE. He is not going to be able to do it  until tomorrow.   The patient admitted is still with active chest pain, going to the Critical Care Unit on nitroglycerin drip.   REVIEW OF SYSTEMS:  A 12-system review of systems is done.  CONSTITUTIONAL: No fever, fatigue. Positive weight gain through several years.  EYES: No blurry vision or double vision.  EARS, NOSE AND THROAT: No difficulty swallowing. No thrush.  NECK: No neck pain or swelling of the neck.  CARDIOVASCULAR: As mentioned above, chest pain and shortness of breath.  RESPIRATORY: No cough. No wheezing. No hemoptysis. No COPD.  He has never smoked.  GASTROINTESTINAL: No nausea, vomiting, abdominal pain, constipation or diarrhea.  GENITOURINARY: No difficulty urinating. No dysuria or hematuria.  HEMATOLOGIC AND LYMPHATIC: No anemia, easy bruising or bleeding.  ENDOCRINE: No polyuria, polydipsia or polyphagia.  MUSCULOSKELETAL: Positive weakness of upper extremities, which is chronic. Occasional back pain, knee pain and osteoarthritis.  NEUROLOGIC: No TIAs. No CVA.  PSYCHIATRIC: No significant anxiety or depression.  A 12-system review of systems is done, all negative except for mentioned above.   PAST MEDICAL HISTORY: 1.  Coronary artery disease, status post CABG.  2.  Diabetes.  3.  Hypertension.  4.  Hyperlipidemia.  5.  GERD.  6.  History of cardiac arrest after knee surgery.  7.  Left bundle branch block.  8.  History of multiple nephrolithiases.   PAST  SURGICAL HISTORY: 1.  CABG x 5 in 2000.  2.  Knee surgery, total knee replacement.  3.  Right upper extremity forearm surgery, orthopedic procedure.  4.  Pilonidal cyst removal.   FAMILY HISTORY:  1.  Positive for coronary artery disease in his dad, who died from stroke.  2.  His mother had liver cancer.  3. The patient denies any hypertension, diabetes, in his family.   SOCIAL HISTORY: The patient has never smoked. He drinks alcohol very rarely, twice a year. He is on disability since 2009 due to his heart  issues. He lives by himself. He has a dog.   CURRENT MEDICATIONS: Include aspirin 81 mg daily, Diovan 160 mg daily, Nexium 40 mg daily, metoprolol succinate once daily, metformin 1000 mg twice daily, fenofibrate 150 mg daily.   PHYSICAL EXAMINATION: VITAL SIGNS: Blood pressure 145/70, pulse 81, respiratory rate 20. The patient is afebrile. GENERAL:  Alert and oriented x 3, in mild distress due to chest pain. No respiratory distress. Hemodynamically stable.  HEENT: Pupils are equal and reactive. Extraocular movements are intact. Mucosa moist. Anicteric sclerae. Pink conjunctivae. No oral lesions. No oropharyngeal exudates.  NECK: Supple. No JVD. No thyromegaly. No adenopathy. No carotid bruits. No rigidity.  CARDIOVASCULAR: Regular rate and rhythm. No murmurs, rubs or gallops are appreciated. No displacement of PMI.  CHEST AND LUNGS:  Positive tenderness to palpation of anterior chest wall whenever apply pressure. The patient states he it is really sore and tight.  His lungs are clear without any wheezing or crepitus. No use of accessory muscles.  ABDOMEN: Soft, nontender, nondistended. No hepatosplenomegaly. No masses. Exam is difficult due to obesity.  GENITAL: Deferred.  EXTREMITIES: No edema, cyanosis or clubbing. Pulses +2. Capillary refill less than 3.  LYMPHATIC: Negative for lymphadenopathy in neck or supraclavicular areas.  NEUROLOGIC: Cranial nerves II through XII intact. Strength is 5 out of 5 in 4 extremities.  PSYCHIATRIC: Negative for agitation. The patient is alert and oriented x 3.  MUSCULOSKELETAL: No joint effusion or joint swelling.  SKIN: No rashes or petechiae.   LABORATORY DATA:  Glucose 179, BUN 25, creatinine 1.49, potassium 4. Other electrolytes within normal limits. LFTs within normal limits. First set of troponin is negative. White count is 9.6, hemoglobin 11.3, platelet count 258. The patient has microcytic anemia.   EKG: As mentioned above.   CT of the chest with IV  contrast to rule out PE is negative for aortic dissection or aortic pathology.  There is mild ventricular septal wall thickness, multiple bilateral scattered pulmonary nodules from 3 to 5 mm, sequela of prior inflammatory or granulomatous process. Recommended CT in 1 year.  Cardiomegaly, hepatic steatosis, bilateral nonobstructing renal stones.    ASSESSMENT AND PLAN:  This is a 64 year old gentleman with history of coronary artery disease, who comes with significant chest pain, abnormalities on EKG and normal cardiac enzymes so far.  1.  Acute coronary syndrome. The patient is very symptomatic. He is still having active chest pain. Cardiology has seen him and they recommended cardiac catheterization. Since the patient had a CT scan, they are not going to proceed today; they are going to wait until tomorrow. He has elevation of creatinine anyway. The patient is starting to feel a little bit better, but with active chest pain, we are going to put him on nitroglycerin drip and heparin drip.   We are going to hydrate his kidneys with 1 liter of NS through today to help him prevent effect from  the contrast.   He is going to have aspirin, nitroglycerin, statin and a beta blocker.   The patient is being put on metoprolol p.o. every 6 hours. Since we have him on metoprolol, I am going to hold on his Diovan.  2.   History of gastroesophageal reflux disease. Continue PPI.  3.  Diabetes. Continue insulin sliding scale. Try to keep good blood sugar levels, give insulin sliding scale and hemoglobin A1c.  4.  History of kidney stones that are stable.  5.  Anemia, likely iron deficiency due to microcytic pattern. We will address that as an outpatient and add on iron at discharge.  6.  Deep vein thrombosis prophylaxis with heparin.  7.  Gastrointestinal prophylaxis with proton pump inhibitor.   I spent about 50 minutes with this patient. Case discussed with cardiology.     ____________________________ Felipa Furnace, MD rsg:dmm D: 10/09/2013 09:39:05 ET T: 10/09/2013 10:40:42 ET JOB#: [id number]  cc: Felipa Furnace, MD, <Dictator> Carmon Brigandi Juanda Chance MD ELECTRONICALLY SIGNED 10/12/2013 20:38

## 2014-12-13 NOTE — Discharge Summary (Signed)
PATIENT NAME:  Joshua Larsen, Joshua Larsen MR#:  [id number] DATE OF BIRTH:  [birth year]  DATE OF ADMISSION:  12/13/2013 DATE OF DISCHARGE:  12/16/2013  CONSULTANTS: Dr. Achilles Dunk and Dr. Alyce Pagan from urology. Dr. Lady Gary from cardiology.   PRIMARY CARE PHYSICIAN: Dr. Sullivan Lone.   CHIEF COMPLAINT: Right flank pain.   DISCHARGE DIAGNOSES: 1.  Right flank pain due to nephrolithiasis.  2.  Acute on chronic renal failure with obstructive uropathy and hydroureter on the right due to nephrolithiasis.  3.  Nonobstructive kidney stones on the left.  4.  Moderate right-sided hydroureteronephrosis.  5.  Coronary artery disease.  6.  Diabetes.  7.  Hypertension.  8.  History of myocardial infarction.  9.  History of bypass surgery.  10.  Knee surgery.   DISCHARGE MEDICATIONS: Aspirin 81 mg daily, niacin 500 mg once a day extended-release, omega-3 2 grams once a day, oxycodone 5 mg every 6 hours as needed for pain, atorvastatin 20 mg daily, clopidogrel 75 mg daily, fenofibrate 150 mg once a day, Zantac 150 mg once a day, Januvia 50 mg once a day.   DIET: Low sodium, low fat, ADA, low cholesterol diet.  Please follow with Dr. Lady Gary and Dr. Achilles Dunk within 1 to 2 weeks. Check your BMP on Thursday as discussed for kidney function tests. Resume valsartan once approved by your doctor. You may need a lower dose of metoprolol. If you have persistent or worsening bloody urine, call Dr. Achilles Dunk and hold your aspirin and Plavix.   DISPOSITION: Home.  SIGNIFICANT LABORATORIES AND IMAGING: CT of abdomen and pelvis showing obstructing 5.5 mm stone in distal right ureter with moderate right-sided hydroureteronephrosis, additional 5 mm nonobstructing stone in the lower pole of the right, 3 nonobstructing stones in the left kidney measuring up to 4.5 mm.  Initial BUN of 48, creatinine 3.52 to last creatinine of 2.36, peak potassium 5.5, last potassium 5.1. Initial white count of 12.1, hemoglobin 10.9. Urine cultures on 24th and 25th, no  growth to date.  HISTORY OF PRESENT ILLNESS AND HOSPITAL COURSE: For full details of H and P, please see the dictation on 04/24 by Dr. Luberta Mutter, but briefly, this is a pleasant 64 year old with history of coronary artery disease, status post CABG, hypertension, diabetes, who also has history of nephrolithiasis in the past. Has passed, per him, more than 70 stones. He came in with right-sided flank pain and was noted to have a right ureteral stone with above findings on a CAT scan. He was admitted to the hospitalist service. Urology was consulted and the patient was seen by Dr. Achilles Dunk. The patient underwent stent placement by Dr. Alyce Pagan on the right side on 04/25. We suspect the patient's acute renal failure, which is improving, is acute on chronic in setting of the stones. At this point, he has significant improved pain, is peeing well but has some light hematuria. He will have to follow with Dr. Achilles Dunk for definitive stone therapy in the next week or two. Although initially he was started on antibiotics, the cultures have been negative and he will be discharged without antibiotics at this time and he was counsel that if he has any fevers, chills call Dr. Achilles Dunk right away. In regards to his CAD, he was continued on his beta blocker, initially; however, he does have some low heart rates. He has been off of his metoprolol for a couple of days and his heart rate is in the 60s. I discussed the case with Dr. Lady Gary, who recommended holding the  metoprolol and he could follow him as an outpatient and perhaps start low-dose metoprolol or Coreg. Also his lisinopril/HCTZ was held. He states that he has lost about 15 pounds of weight and he has more low blood pressures than high blood pressure at this time. I recommend discontinuing the hydrochlorothiazide but perhaps he might need Azor and ARB, but at a lower dose. At this point, he will be discharged without beta blocker or ARB. He was instructed to follow with PCP and check a  BMP for persistent renal function testing on Thursday. He verbalized understanding and at this point, since the pain is better, cultures have been negative and BUN and creatinine is improving, he will be discharged.   PHYSICAL EXAMINATION: VITAL SIGNS: On the day of discharge, temperature is 98, pulse rate 68, respiratory rate 20, blood pressure 132/58 without any blood pressure medications, O2 sat 99% on room air.  GENERAL: The patient is an obese male ambulating in the room without significant issues, appears comfortable. Normocephalic, atraumatic. Normal S1, S2. LUNGS:  Clear.  ABDOMEN: Soft, nontender. No significant pitting edema.  We discharged with outpatient followup as dictated above.  TOTAL TIME SPENT:  40 minutes.   ____________________________ Krystal Eaton, MD sa:ce D: 12/16/2013 16:10:39 ET T: 12/16/2013 20:24:32 ET JOB#: [id number]  cc: Krystal Eaton, MD, <Dictator> Darlin Priestly. Lady Gary, MD Madolyn Frieze. Achilles Dunk, MD Richard L. Sullivan Lone, MD Krystal Eaton MD ELECTRONICALLY SIGNED 12/19/2013 14:35

## 2014-12-13 NOTE — Discharge Summary (Signed)
PATIENT NAME:  Joshua Larsen, Joshua Larsen MR#:  [id number] DATE OF BIRTH:  [birth year]  DATE OF ADMISSION:  10/09/2013  DATE OF DISCHARGE:  10/12/2013  ADMITTING DIAGNOSIS: Non-Q-wave myocardial infarction.   DISCHARGE DIAGNOSES:  1.  Non-Q-wave myocardial infarction, status post cardiac catheterization on 10/10/2013 by Dr. Darrold Junker, revealing ejection fraction calculated by contrast ventriculography of 40%, severe 3-vessel coronary artery disease with occluded LM, likely culprit vessel, and diffuse diseased RCA. Appears unchanged from previous catheterization. LIMA to LAD patent. SVG to OM1 patent, but 50% stenosis body of graft, patent SVG to DG1 with high grade stenosis near anastomotic site, complex and high risk, occluded SVG to PDA, chronic.   2.  Cardiomyopathy, with ejection fraction of 40%.  3.  History of diabetes, hypertension, hyperlipidemia, gastroesophageal reflux disease, history of left bundle branch block and nephrolithiasis, status post coronary artery bypass grafting x 5 in 2000, morbid obesity, obstructive sleep apnea.  DISCHARGE CONDITION: Stable.   DISCHARGE MEDICATIONS:  1.  The patient is to resume Diovan/hydrochlorothiazide 160/25 mg 1 tablet daily. 2.  Fenofibrate 150 mg p.o. daily. 3.  Aspirin 81 mg p.o. daily. 4.  Nexium 40 mg p.o. daily. 5.  Metoprolol extended release 100 mg p.o. daily at bedtime, this is a new dose.  6.  Niacin 500 mg p.o. daily at bedtime, this is new medication.  7.  Atorvastatin 20 mg p.o. daily.  8.  Plavix 75 mg p.o. daily.  9.  Omega-3 polyunsaturated fatty acids 2 grams once daily.  10.  Nitrostat 0.4 mg sublingually every 5 minutes as needed.  INSTRUCTIONS: Home oxygen: None. Diet: 2 grams salt, low-fat, low-cholesterol, carbohydrate-controlled diet, regular consistency. Activity limitations as tolerated. Followup appointment with Dr. Sullivan Lone in 2 days after discharge, as well as Dr. Darrold Junker in one week after discharge.   CONSULTANTS:  Care Management, Social Work, Dr. Darrold Junker.   RADIOLOGIC STUDIES: Chest x-ray, portable single view 10/09/2013 showed minimal patchy opacity at lung bases, likely atelectasis, according to Radiology. CT angiogram of chest, abdomen and pelvis with and without contrast 10/09/2013 showing negative for acute aortic dissection or any other acute aortic pathology. There is a decreased enhancement of the basal and mid-ventricular septal wall, full-thickness in basal and mid ventricular anterior and anteroseptal wall, endocardial only, concerning for possible acute myocardial infarction. However, given the history of prior multivessel coronary artery bypass grafting in the arterial phase timing of this study, this could simply represent differential transfusion or the ventricular myocardium. Recommend clinical correlation with EKG and serum troponins. Multiple bilateral scattered pulmonary nodules ranging in size from 3 to 5 mm are highly likely sequelae of the prior infectious, inflammatory or granulomatous process. The nodules in the lung base, which can be compared to prior CT abdomen and pelvis dated on 12/02/2011, remained stable in size, further suggesting the benign process. Recommend one additional CT scan of the chest in one year to confirm stability of all nodules. Cardiomegaly and left ventricular dilatation and surgical changes of prior multivessel coronary artery bypass grafting. Evaluation of the coronary artery bypass graft is limited, given the elongated cardiac technique. The limit to LAD bypass graft appears patent. CT of abdomen and pelvis showed no acute aortic pathology. Mild scattered arteriosclerotic vascular disease without significant stenosis noted. Suspect hepatic steatosis, bilateral nonobstructing renal calculi.   CARDIAC CATHETERIZATION RESULTS: Cardiac catheterization done on 10/10/2013 by Dr. Darrold Junker showed ejection fraction calculated by contrast ventriculography was 40%, severe  3-vessel coronary artery disease with occluded LM, likely culprit vessel, and  diffuse RCA, appears unchanged from prior catheterization. LIMA to LAD patent. SVG to OM1 patent, with 50% stenosis body of graft. Patent SVG to diagonal-one, with a high-grade stenosis, near anastomotic sites, complex and high risk occluded SVG  to PDA, chronic.   REASON FOR ADMISSION: The patient is a 64 year old Caucasian male with history of coronary artery disease, who presents to the hospital with complaints of chest pains. Please refer to Dr. Berlinda Last admission note on 10/09/2013. On arrival to the hospital, patient's blood pressure was 145/70, pulse was 81, respiratory rate was 20, he was afebrile.   PHYSICAL EXAMINATION: Unremarkable. Mild tenderness to palpation of anterior chest wall was noted. Otherwise was unremarkable.   DATA: EKG had depression in ST segment lateral leads, and left bundle branch block. The patient's lab data done on admission, again 10/09/2013 revealed elevation of BUN and creatinine to 25 and 1.49, glucose 179, otherwise BMP was unremarkable. The patient's magnesium level was low at 1.4. Liver enzymes were unremarkable. The patient's first set of cardiac enzymes revealed mild elevation of CK-MB up to 4.0. Troponin was less than 0.02. however, on the second set, patient's troponin was elevated to 32.0. MB fraction was 181, CK total was 1765. A third set showed elevation of troponin to more than 40. MB fraction was 263.3. CK total was 2443. TSH was normal at 0.59. The patient's CBC done on admission:  White blood cell count was 9.6, hemoglobin was 11.3, and platelet count was 258. Urinalysis was unremarkable.   HOSPITAL COURSE:  The patient was admitted to the hospital for further evaluation. He was initiated on heparin drip and admitted to Critical Care Unit. Consultation with Dr. Darrold Junker was obtained. Dr. Darrold Junker preceded taking patient to cardiac catheterization lab, and cardiac cath  was performed, as mentioned above, on 10/10/2013. Post-procedure, he did satisfactory. He was about to be discharged on the 10/11/2013. However, developed recurrent chest pains on exertion, and was kept overnight until 10/12/2013.   On 10/12/2013, he did well. He was ambulated, and did not have any significant discomfort. His vitals were stable, his temperature was 98.6, pulse was ranging between 70s to 90s, respiratory rate was 18 to 22, and blood pressure was ranging from 108 systolic to 142 systolic and 70s diastolic. O2 sats were 98% on room air at rest. If stable to be discharged home, he is, however, to continue high doses of metoprolol and follow up with his primary care physician as well as cardiologist the next one week after discharge. He is also to continue Plavix as well as cholesterol-managing medications. He lipid panel was performed while he was in the hospital, and was noted to have LDL of 45. However, patient's triglycerides were high at 275, and HDL was low at 15. The patient's hemoglobin A1c was checked, and was found to be less than 3.5. It was felt that patient should stop his metformin, especially since he has mild renal insufficiency, and his cholesterol as well as triglyceride management medications were advanced. He is to continue statins as well as fenofibrate, niacin, and omega-3 polyunsaturated fatty acids. He is to follow up with his primary care physician as well as Cardiology for further recommendations.   In regards to hypertension, patient is to continue high doses of metoprolol, and continue Diovan/ hydrochlorothiazide. His blood pressure was satisfactorily controlled. In regards to diabetes, as mentioned above, patient is to stop metformin due to renal insufficiency as well as very low hemoglobin A1c and concerns about possible hypoglycemia, which  could lead patient to worsening cardiac outcomes.   In regards gastroesophageal reflux disease as well as chronic medical problems,  including obstructive sleep apnea, the patient is to continue his outpatient management. No changes were made here. The patient is being discharged in stable condition with the above-mentioned medications and follow-up.   TIME SPENT: 40 minutes.   ____________________________ Katharina Caper, MD rv:mr D: 10/12/2013 17:59:00 ET T: 10/12/2013 20:01:02 ET JOB#: [id number]  cc: Katharina Caper, MD, <Dictator>   Azalie Harbeck MD ELECTRONICALLY SIGNED 10/23/2013 21:12

## 2014-12-13 NOTE — Discharge Summary (Signed)
PATIENT NAME:  Joshua Larsen, Joshua Larsen MR#:  [id number] DATE OF BIRTH:  [birth year]  DATE OF ADMISSION:  08/07/2014 DATE OF DISCHARGE:  08/08/2014  PRIMARY CARE PHYSICIAN: Richard L. Sullivan Lone, MD.  FINAL DIAGNOSES: 1. Coronary artery disease.  2. Hypertension.  3. Hyperlipidemia.  4. Diabetes.  5. Sleep apnea.  6. Obesity.  7. Gastroesophageal reflux disease.   DISCHARGE MEDICATIONS: Aspirin 81 mg daily, clopidogrel 75 mg daily, atorvastatin 20 mg daily, fenofibrate 160 mg daily, isosorbide mononitrate 30 mg daily, metoprolol 50 mg daily, valsartan/hydrochlorothiazide 160/25 mg daily, Nexium 40 mg daily, Januvia 50 mg daily.   PROCEDURES PERFORMED: 1. Cardiac catheterization with selective coronary arteriography on 08/07/2014.  2. Percutaneous coronary intervention on 08/07/2014.   HISTORY OF PRESENT ILLNESS: Please see admission H and P.   HOSPITAL COURSE: The patient underwent elective cardiac catheterization on 08/07/2014. Coronary arteriography revealed occluded left main  and diffusely diseased right coronary artery not amenable to PCI. Saphenous vein bypass graft to the PDA and D1 were occluded. LIMA graft to the LAD was patent. There was a high-grade 95% stenosis in the mid segment of the saphenous vein bypass graft to the first obtuse marginal branch. The patient underwent percutaneous coronary intervention, receiving a 3.25 x 18 mm drug-eluting stent. The patient had excellent angiographic result. He was return to the telemetry floor, where he had an uncomplicated hospital stay. The morning of 08/08/2014, BUN and creatinine were 20 and 1.55, respectively. The patient was chest pain-free and was discharged home for follow-up with Dr. Lady Gary in one week.    ____________________________ Marcina Millard, MD ap:mw D: 08/08/2014 08:37:12 ET T: 08/08/2014 13:28:19 ET JOB#: [id number]  cc: Marcina Millard, MD, <Dictator> Marcina Millard MD ELECTRONICALLY SIGNED 08/19/2014 13:08

## 2014-12-13 NOTE — Consult Note (Signed)
Patient name: Joshua Larsen  Date of birth: 1951/03/26  Date of consult: 12-13-2013  History: Mr Carlise is a 64 year old white gentleman with a history of nephrolithiasis.  He has not had a formal evaluation.  He states that he has a history of uric acid stones.  He does also have a history of gout.  He is currently utilizing a thiazide diuretic.  He is on no allopurinol or other medication from the gout standpoint.  He developed sudden onset right-sided flank pain and discomfort.  He presented earlier to the emergency room.  He does have some baseline renal insufficiency.  His baseline serum creatinine was around 1.8.  He returned back to the emergency room with ongoing pain or discomfort.  His creatinine has risen to 3.5.  The pain was able to be controlled.  He denies any fevers or chills.  His previous urine culture demonstrated no evidence of growth.  A CT scan demonstrates a 3 x 5 mm stone at the RIGHT UVJ.  He has passed larger stones in the past.  There is a reasonable chance that he may pass the stone spontaneously.  Aggressive IV hydration is recommended in this situation.  He is also noted have bilateral renal calculi.  The ideal situation would be to proceed with stone removal with or without stent placement depending on findings at the time of the procedure.  The renal function should improve once the stone has been removed.  This is at least back to baseline.  Discussion was undertaken with patient regarding the need for eventual evaluation and treatment.  The use of potassium citrate and or allopurinol would be recommended.  The thiazide diuretic could also be a congenital voiding factor.  Past medical history: Nephrolithiasis, coronary artery disease, degenerative joint disease, hyperlipidemia, hypertension, non-insulin-dependent diabetes mellitus, gout  Past surgical history: Bilateral total knee replacements, wrist surgery, CABG times 4  Allergies: Amoxicillin,  codeine  Medications: Zantac 150 mg 1 tablet daily, oxycodone 1 tablet every 6 hours as needed, nitroglycerin sublingual as needed, niacin 500 mg once daily, metoprolol ER 100 mg daily, Januvia 50 mg daily, valsartan-hydrochlorothiazide 25/160 once daily, Plavix 75 mg daily, fenofibrate 100 mg once daily, ASA 81 mg daily, atorvastatin 20 mg daily  Physical examination: Afebrile-vital signs stable General: Awake, alert, in no apparent distress HEENT: Within normal limits Chest: Clear to last dictation bilaterally Cardiovascular: Regular rate and rhythm Abdomen: Nontender nondistended no palpable masses, mild RIGHT CVA tenderness Extremities: Free range of motion times 4 Neurological: Motor and sensory grossly intact  Assessment: Bilateral nephrolithiasis, RIGHT ureteral lithiasis with hydronephrosis, acute on chronic renal failure  Recommendation: The patient needs at minimum cystoscopy and stent placement.  There is no elective low are time available until early Saturday morning.  The option of a more elective ureteroscopy and stone removal with holmium laser lithotripsy tomorrow morning would certainly be reasonable.  We will coordinate this with the on-call physician.  In the event a laser cannot be obtained, cystoscopy and stent placement would otherwise be recommended.  Given his history of large stone passage, there is a chance that the stone may still pass spontaneously.  I would recommend aggressive IV hydration.  Flomax 0.4 milligrams to facilitate stone passage.  If the stone does pass, save for stone analysis.  He will need eventual evaluation and treatment from the stone standpoint.  This can be managed on an outpatient basis.  If there are any further questions, please with free to contact us.  Electronic Signatures: Smith Robert (MD)  (Signed on 24-Apr-15 17:51)  Authored  Last Updated: 24-Apr-15 17:51 by Smith Robert (MD)

## 2014-12-13 NOTE — Consult Note (Signed)
Brief Consult Note: Diagnosis: right flank pain in patient with history of cad s/p cabg and recent nstemi.   Patient was seen by consultant.   Recommend to proceed with surgery or procedure.   Comments: 64 yo male with history of cad s/p recent nstemi in 2/15 treated medically now admitted with right flank pain and evidence of ureteral obstruction with acte on chronic renal insuffiency. He is currently without cardiac symptoms. He will likely need lithotripsy and or ureteral stent. Pt had a nstemi two months ago but appears stable at present. Would proceed iwth aforementioned procedure. Relatively bradycardic but has beenon moderate dose of beta blockers at home. Will resume metoprolol at 25 mg tartrate bid. WIll review post op ekg. Pt is at moderate risk for procedure from cardiac standpoint but is optimally treated from cardiac standpoint at present. Will not require any further cardiac studies preop. Would proceed with planned procedure. Will follow with you post op.  Electronic Signatures: Dalia Heading (MD)  (Signed 25-Apr-15 08:17)  Authored: Brief Consult Note   Last Updated: 25-Apr-15 08:17 by Dalia Heading (MD)

## 2014-12-21 NOTE — Discharge Summary (Signed)
PATIENT NAME:  Joshua Larsen, Joshua Larsen MR#:  [id number] DATE OF BIRTH:  [birth year]  DATE OF ADMISSION:  09/16/2014 DATE OF DISCHARGE:  09/18/2014  DISCHARGE DIAGNOSES:  1.  Chest pain secondary to coronary artery disease.  2.  Coronary artery disease status post stent and coronary artery bypass graft before.  3.  Arthritis.  4.  Hyperlipidemia.  DISCHARGE MEDICATIONS: 1.  Aspirin 81 mg daily.  2.  Atorvastatin 20 mg p.o. daily.  3.  Plavix 75 mg p.o. daily.  4.  TriCor 160 mg p.o. daily.  5.  Imdur 30 mg p.o. daily.  6.  Toprol-XL 50 mg p.o. daily.  7.  Hydrochlorothiazide with Valsartan 25/160 mg p.o. daily.  8.  Nitrostat 0.4 mg sublingual as needed for chest pain.  9.  Nexium 40 mg p.o. daily.   DIET: Low-sodium, low-fat diet.  FOLLOWUP: The patient advised to follow up with Dr. Gwen Pounds, has appointment on February 5 at 10:45 a.m.   CONSULTATIONS: Cardiology consultation with Dr. Gwen Pounds.   HOSPITAL COURSE: The patient is a 64 year old male patient with a history of CAD and stent before, comes in because of chest pain. The patient had midsternal chest discomfort and the patient's first troponin was 0.22. He had a further troponin elevation of 21 and then 30. Admitted to CCU for non-ST-elevation MI. Started on heparin drip and nitroglycerin drip. The patient's chest pain decreased with nitroglycerin drip. The patient was continued on heparin drip and also Nitro-Dur. He was given aspirin, statins, Plavix, and beta blockers. He received a high-dose statin at 40 mg of atorvastatin here. He remained asymptomatic, that means chest pain free with heparin drip and nitroglycerin drip. The patient was taken to cardiac catheterization by Dr. Gwen Pounds on January 27. The patient's cardiac catheterization showed EF of 40% and had inferior hypokinesia,patent stented graft to OM1 and patent LIMA to LAD with collaterals to PDA. The patient has significant diffuse stenosis of RCA at the new infarct but not  amenable to PCI, so Dr. Gwen Pounds recommended continuing aspirin, Plavix, beta blockers, Imdur and statins, and cardiac rehabilitation. So after the cardiac catheterization, we monitored him in ICU for another 24 hours. We stopped the heparin drip and nitroglycerin drip. The patient remained asymptomatic. Troponins came down to 20. We advised him to continue all his medications including TriCor and statins. Patient does have arthritis and he uses pain medicines for that, and advised him to be compliant with medications.   DISCHARGE VITAL SIGNS: Temperature 98.6, heart rate 58, blood pressure 105/72, oxygen saturations 100% on room air.   PHYSICAL EXAMINATION: Showed:  CARDIOVASCULAR: S1, S2 regular.  LUNGS: Clear to auscultation.  ABDOMEN: Soft, nontender, nondistended. Bowel sounds present.  LABORATORY DATA: The patient received IV hydration to prevent contrast-induced nephropathy. Creatinine is around 1.7.   CONDITION: Stable.    ___________________________ Katha Hamming, MD sk:ST D: 09/20/2014 10:19:04 ET T: 09/20/2014 22:50:20 ET JOB#: [id number]  cc: Katha Hamming, MD, <Dictator> Lamar Blinks, MD Katha Hamming MD ELECTRONICALLY SIGNED 09/23/2014 13:31

## 2014-12-21 NOTE — H&P (Signed)
PATIENT NAME:  Joshua Larsen, Joshua Larsen MR#:  [id number] DATE OF BIRTH:  1951/06/24  DATE OF ADMISSION:  09/16/2014  REFERRING PHYSICIAN: Rebecka Apley, MD  PRIMARY CARE PHYSICIAN:  Richard L. Sullivan Lone, MD  ADMISSION DIAGNOSIS: Unstable angina.   HISTORY OF PRESENT ILLNESS: This is a 64 year old Caucasian male who presents to the Emergency Department complaining of central chest pain. The patient denies radiation of the chest pain as well as any nausea, vomiting or diaphoresis. The patient admits to feeling slightly clammy, but denies lightheadedness or dizziness at the onset of chest pain. It began at rest. He states that he had been feeling somewhat ill for approximately 45 minutes prior to the onset of chest pain. He correlates this to starting prednisone for radicular pain in his right arm. Notably, the patient denies having any right upper extremity pain with his previous heart attack, which was 4 months ago. He took 5 nitroglycerin tablets over the course of the next 3 hours without significant relief. On arrival to the Emergency Department, his pain was 10 out of 10 in severity. After initiating a nitroglycerin drip in the Emergency Department, his pain decrease to 5 or 6 out of 10 in severity. Due to his mildly elevated troponin and extensive history of coronary artery disease, the Emergency Department called for admission.   REVIEW OF SYSTEMS:  CONSTITUTIONAL: The patient denies fever or weakness.  EYES: Denies blurred vision or inflammation.  EARS, NOSE AND THROAT: Denies tinnitus or sore throat.  RESPIRATORY: Denies cough or shortness of breath.  CARDIOVASCULAR: Admits to chest pain, but denies palpitations, orthopnea, or paroxysmal nocturnal dyspnea.  GASTROINTESTINAL: Denies nausea, vomiting, or diarrhea.  GENITOURINARY: Denies dysuria, increased frequency, or hesitancy of urination. HEMATOLOGIC AND LYMPHATIC: Denies easy bruising or bleeding.  ENDOCRINE: Denies polyuria or polydipsia.   INTEGUMENT: Denies rashes or lesions.  MUSCULOSKELETAL: Admits to generalized aches and pains, but denies arthralgias.  NEUROLOGIC: Denies numbness in his extremities or dysarthria.  PSYCHIATRIC: Denies suicidal ideation or depression.   PAST MEDICAL HISTORY: Coronary artery disease, hypertension, obstructive sleep apnea,  diabetes mellitus type 2, osteoarthritis, as well as a history of renal calculi and ureteral strictures.   SURGICAL HISTORY: Coronary artery bypass graft x 5 vessels, bilateral total knee replacements, pilonidal cyst removal, right wrist reconstruction, ureteral stents, a drug-eluting stent to the saphenous vein graft of his first obtuse marginal branch in December 2015.  SOCIAL HISTORY: The patient does not smoke, drink, or do any drugs. He lives alone.   FAMILY HISTORY: Coronary artery disease and stroke in his father. His mother is deceased of liver cancer, and his sister has had medulloblastoma but is still living.   MEDICATIONS:  1.  Aspirin 81 mg 1 tablet p.o. daily.  2.  Atorvastatin 20 mg 1 tablet p.o. daily.  3.  Clopidogrel 75 mg 1 tablet p.o. daily, fenofibrate 150 mg 1 tablet p.o. daily.  4.  Hydrochlorothiazide with valsartan 25 mg/160 mg 1 tablet p.o. daily.  5.  Isosorbide mononitrate extended release 30 mg 1 tablet p.o. daily.  6.  Metoprolol succinate extended release 50 mg 1 tablet p.o. daily.  7.  Nexium 40 mg extended release 1 capsule p.o. daily.  8.  Nitrostat 0.4 mg sublingual tablets every 5 minutes as needed for chest pain.   ALLERGIES: AMOXICILLIN AND CODEINE.   PERTINENT LABORATORY RESULTS AND RADIOGRAPHIC FINDINGS: Serum glucose is 278, BUN is 22, creatinine 1.87, serum sodium 138, potassium is 4.5, chloride is 106, bicarbonate 23, calcium 9.7.  Troponin is 0.22. White blood cell count is 10.3, hemoglobin is 11.5, hematocrit is 36, platelet count is 240,000, MCV is 77. INR is 1.1. Chest x-ray shows minimal bilateral atelectasis; otherwise,  clear lung fields. CT of the abdomen is negative for dissection or thoracic aneurysm. There is no evidence of active pulmonary disease either.   PHYSICAL EXAMINATION:  VITAL SIGNS: Temperature is 98.2, pulse 84, respirations 13, blood pressure is 141/111, pulse oximetry is 100% on 2 L of oxygen via nasal cannula.  GENERAL: The patient is alert and oriented x 3, in no apparent distress.  HEENT: Normocephalic, atraumatic. Pupils are equal, round, and reactive to light and accommodation. Extraocular movements are intact. Mucous membranes are moist.  NECK: Trachea is midline. No adenopathy. Thyroid is nonpalpable, nontender.  CHEST: Symmetric, atraumatic. There is a well-healed midline sternotomy scar.  CARDIOVASCULAR: Regular rate and rhythm. Normal S1, S2. No rubs, clicks, or murmurs appreciated.  LUNGS: Clear to auscultation bilaterally. Normal effort and excursion.  ABDOMEN: Positive bowel sounds. Soft, nontender, nondistended. No hepatosplenomegaly.  GENITOURINARY: Deferred.  MUSCULOSKELETAL: The patient moves all 4 extremities equally. I have not walked him as he is having chest pain and is on the monitor.  SKIN: No rashes or lesions; it is warm and dry.  EXTREMITIES: No clubbing, cyanosis, or edema.  NEUROLOGIC: Cranial nerves II through XII are grossly intact.  PSYCHIATRIC: Mood is normal. Affect is congruent. The patient has excellent judgment and insight into his medical condition.   ASSESSMENT AND PLAN: This is a 64 year old male admitted for unstable angina.  1.  Chest pain that began at rest. This is unstable angina. It decreased some after starting a nitroglycerin drip. The patient has diffuse coronary artery disease. In fact, his last catheterization showed an RCA that was completely occluded and not amenable to intervention. His EKG on this visit to the Emergency Department showed some ST depression in the lateral leads, with a mildly elevated troponin. Thus, the Emergency Department  started a heparin drip. They also gave him 325 mg of aspirin. We will continue Plavix and obtain a cardiology consult.  2.  Hypertension. Continue metoprolol, hydrochlorothiazide, and valsartan.  3.  Diabetes type 2. We will place the patient on sliding-scale insulin while hospitalized.  4.  Morbid obesity. The patient's BMI is 46.6. I have encouraged diet and exercise, which consists of some cardiac rehab following this hospitalization.  5.  Deep vein thrombosis prophylaxis. The patient is currently on treatment doses of heparin.  6.  Gastrointestinal prophylaxis: None.  CODE STATUS: The patient is a Full Code.   TIME SPENT ON ADMISSION ORDERS AND PATIENT CARE: Approximately 45 minutes.    ____________________________ Kelton Pillar. Sheryle Hail, MD msd:MT D: 09/16/2014 07:02:08 ET T: 09/16/2014 08:27:31 ET JOB#: [id number]  cc: Kelton Pillar. Sheryle Hail, MD, <Dictator> Kelton Pillar DIAMOND MD ELECTRONICALLY SIGNED 09/24/2014 6:57

## 2014-12-21 NOTE — Consult Note (Signed)
PATIENT NAME:  Joshua Larsen, Joshua Larsen MR#:  [id number] DATE OF BIRTH:  June 01, 1951  DATE OF CONSULTATION:  09/16/2014  REFERRING PHYSICIAN:  Kelton Pillar. Sheryle Hail, MD CONSULTING PHYSICIAN:  Lamar Blinks, MD  REASON FOR CONSULTATION: Acute non-ST elevation myocardial infarction.   CHIEF COMPLAINT: "I have chest pain."   HISTORY OF PRESENT ILLNESS: This is a 64 year old male with known hypertension, hyperlipidemia, coronary artery disease status post coronary artery bypass graft in the past, who has had new onset of substernal chest discomfort and shortness of breath with right shoulder pain. This was after an injection and treatment of right hand gout and right shoulder pain with prednisone, for which she had significant shortness of breath and pressure for several hours. He was seen in the Emergency Room, at which time he had an EKG showing normal sinus rhythm with non specific intraventricular conduction defect. There was no evidence of ST elevation, and the patient was treated with appropriate medications.  Troponin eventually was elevated at 22, consistent with non-ST elevation myocardial infarction. He has had bypass surgery, and has had issues with stented artery in the posterior wall circumflex area in December. At that time, he felt quite good and was on appropriate medication management. He now feels better, and has no further episodes of chest discomfort.   REVIEW OF SYSTEMS: The remainder of the review of systems is negative for vision change, ringing in the ears or hearing loss, cough, congestion, heartburn, nausea, vomiting, diarrhea, bloody stools, stomach pain, extremity pain, leg weakness, cramping of the buttocks, known blood clots, headaches, blackouts, dizzy spells, nosebleeds, congestion, trouble swallowing, frequent urination, urination at night, muscle weakness, numbness, anxiety, depression, skin lesions or skin rashes.   PAST MEDICAL HISTORY:  1.  Coronary artery disease.  2.   Essential hypertension.  3.  Mixed hyperlipidemia.   FAMILY HISTORY: Positive for hypertension in family  members, but no early onset of cardiovascular disease.   SOCIAL HISTORY: He denies alcohol or tobacco use.   ALLERGIES: AS LISTED.   MEDICATIONS: As listed.   PHYSICAL EXAMINATION:  VITAL SIGNS: Blood pressure is 118/64 bilaterally. Heart rate is 72 upright, reclining, and regular.  GENERAL: He is a well appearing male in no acute distress.  HEENT: No icterus, thyromegaly, ulcers, hemorrhage, or xanthelasma.  CARDIOVASCULAR: Regular rate and rhythm. Normal S1 and S2. Distant sounds. No apparent murmur, gallop, or rub. PMI is diffuse. Carotid upstroke is normal, without bruit. Jugular venous pressure not seen.  LUNGS: Clear to auscultation with normal respirations.  ABDOMEN: Soft, nontender, without the ability to assess hepatosplenomegaly or masses, due to increased abdominal girth.  EXTREMITIES: Show 2+ radial, trace femoral and dorsal pedal pulses, with trace lower extremity edema. No cyanosis, clubbing, or ulcers.  NEUROLOGIC: He is oriented to time, place, and person, with normal mood and affect.   ASSESSMENT: This is a 64 year old male with known coronary artery disease, essential hypertension, and mixed hyperlipidemia, having an acute non-ST elevation myocardial infarction, after percutaneous coronary intervention  and stent placement 1 month prior, needing further treatment options.   RECOMMENDATIONS:  1.  Continue heparin for further risk reduction in myocardial infarction, and treatment of myocardial infarction, and possible in-stent restenosis.  2.  Continue essential hypertension treatment with ACE inhibitor and beta blocker.  3.  Echocardiogram for LV systolic dysfunction and extent of LV systolic dysfunction after myocardial infarction.  4.  Mixed hyperlipidemia. Treatment with high intensity cholesterol therapy with statin.  5.  Proceed to cardiac catheterization to  assess coronary anatomy and further treatment thereof as necessary. The patient understands the risks and benefits of cardiac catheterization. These include the possibility of death, stroke, heart attack, infection, bleeding or blood clot. He is at low risk for conscious sedation.     ____________________________ Lamar Blinks, MD bjk:MT D: 09/16/2014 08:00:25 ET T: 09/16/2014 09:23:50 ET JOB#: [id number]  cc: Lamar Blinks, MD, <Dictator> Lamar Blinks MD ELECTRONICALLY SIGNED 09/19/2014 8:36

## 2015-02-11 DIAGNOSIS — N179 Acute kidney failure, unspecified: Secondary | ICD-10-CM | POA: Insufficient documentation

## 2015-02-11 DIAGNOSIS — I2510 Atherosclerotic heart disease of native coronary artery without angina pectoris: Secondary | ICD-10-CM | POA: Insufficient documentation

## 2015-02-11 DIAGNOSIS — J45909 Unspecified asthma, uncomplicated: Secondary | ICD-10-CM | POA: Insufficient documentation

## 2015-02-11 DIAGNOSIS — E669 Obesity, unspecified: Secondary | ICD-10-CM | POA: Insufficient documentation

## 2015-02-11 DIAGNOSIS — M109 Gout, unspecified: Secondary | ICD-10-CM | POA: Insufficient documentation

## 2015-02-11 DIAGNOSIS — L659 Nonscarring hair loss, unspecified: Secondary | ICD-10-CM | POA: Insufficient documentation

## 2015-02-11 DIAGNOSIS — F4320 Adjustment disorder, unspecified: Secondary | ICD-10-CM | POA: Insufficient documentation

## 2015-02-11 DIAGNOSIS — G4700 Insomnia, unspecified: Secondary | ICD-10-CM | POA: Insufficient documentation

## 2015-02-11 DIAGNOSIS — E119 Type 2 diabetes mellitus without complications: Secondary | ICD-10-CM | POA: Insufficient documentation

## 2015-02-11 DIAGNOSIS — I10 Essential (primary) hypertension: Secondary | ICD-10-CM | POA: Insufficient documentation

## 2015-02-11 DIAGNOSIS — I219 Acute myocardial infarction, unspecified: Secondary | ICD-10-CM | POA: Insufficient documentation

## 2015-02-11 DIAGNOSIS — F32A Depression, unspecified: Secondary | ICD-10-CM | POA: Insufficient documentation

## 2015-02-11 DIAGNOSIS — B159 Hepatitis A without hepatic coma: Secondary | ICD-10-CM | POA: Insufficient documentation

## 2015-02-11 DIAGNOSIS — G4730 Sleep apnea, unspecified: Secondary | ICD-10-CM | POA: Insufficient documentation

## 2015-02-11 DIAGNOSIS — F329 Major depressive disorder, single episode, unspecified: Secondary | ICD-10-CM | POA: Insufficient documentation

## 2015-02-11 DIAGNOSIS — E785 Hyperlipidemia, unspecified: Secondary | ICD-10-CM | POA: Insufficient documentation

## 2015-02-11 DIAGNOSIS — K219 Gastro-esophageal reflux disease without esophagitis: Secondary | ICD-10-CM | POA: Insufficient documentation

## 2015-02-11 DIAGNOSIS — M1990 Unspecified osteoarthritis, unspecified site: Secondary | ICD-10-CM | POA: Insufficient documentation

## 2015-02-12 ENCOUNTER — Encounter: Payer: Self-pay | Admitting: Family Medicine

## 2015-02-12 ENCOUNTER — Ambulatory Visit (INDEPENDENT_AMBULATORY_CARE_PROVIDER_SITE_OTHER): Payer: Medicare Other | Admitting: Family Medicine

## 2015-02-12 VITALS — BP 116/62 | HR 86 | Temp 98.7°F | Resp 14 | Ht 69.0 in | Wt 321.0 lb

## 2015-02-12 DIAGNOSIS — K219 Gastro-esophageal reflux disease without esophagitis: Secondary | ICD-10-CM

## 2015-02-12 DIAGNOSIS — M1000 Idiopathic gout, unspecified site: Secondary | ICD-10-CM

## 2015-02-12 DIAGNOSIS — E119 Type 2 diabetes mellitus without complications: Secondary | ICD-10-CM | POA: Diagnosis not present

## 2015-02-12 DIAGNOSIS — E669 Obesity, unspecified: Secondary | ICD-10-CM | POA: Diagnosis not present

## 2015-02-12 DIAGNOSIS — E785 Hyperlipidemia, unspecified: Secondary | ICD-10-CM

## 2015-02-12 LAB — POCT GLYCOSYLATED HEMOGLOBIN (HGB A1C): HEMOGLOBIN A1C: 7.7

## 2015-02-12 MED ORDER — ALLOPURINOL 100 MG PO TABS
100.0000 mg | ORAL_TABLET | Freq: Every day | ORAL | Status: DC
Start: 2015-02-12 — End: 2015-11-05

## 2015-02-12 NOTE — Progress Notes (Signed)
Patient ID: Joshua Larsen, male   DOB: 1951-03-25, 64 y.o.   MRN: [id number]    Subjective:  Hyperlipidemia This is a chronic (Last level was checked in May 2016 Dr.Fath.) problem. Exacerbating diseases include obesity. Pertinent negatives include no chest pain (has angina off and on when he feelsstressed) or shortness of breath.   Gout Patient was seen Dr. Jacqlyn Larsen and was getting Allopurinol refills from him. He is no longer wanting to see Dr. Jacqlyn Larsen and would like [place] to take over.  Diabetes Patient has stopped seen Dr. Gabriel Carina about a year now. Medication was very expensive and he just focused on his habits now.  CAD Dr. Ubaldo Glassing. All risk factors treated.  Prior to Admission medications   Medication Sig Start Date End Date Taking? Authorizing Provider  metoprolol succinate (TOPROL-XL) 50 MG 24 hr tablet Take by mouth. 11/07/14  Yes Historical Provider, MD  nitroGLYCERIN (NITROSTAT) 0.4 MG SL tablet Place under the tongue. 09/22/14 09/22/15 Yes Historical Provider, MD  valsartan-hydrochlorothiazide (DIOVAN-HCT) 160-25 MG per tablet Take by mouth. 02/09/15  Yes Historical Provider, MD  allopurinol (ZYLOPRIM) 100 MG tablet Take by mouth.    Historical Provider, MD  aspirin 81 MG tablet Take by mouth. 11/09/11   Historical Provider, MD  atorvastatin (LIPITOR) 20 MG tablet Take by mouth.    Historical Provider, MD  clopidogrel (PLAVIX) 75 MG tablet Take by mouth.    Historical Provider, MD  esomeprazole (NEXIUM) 40 MG capsule Take 1 capsule by mouth daily as needed.    Historical Provider, MD  fenofibrate 160 MG tablet Take by mouth. 04/29/14   Historical Provider, MD  HYDROcodone-acetaminophen (NORCO/VICODIN) 5-325 MG per tablet Take 1 tablet by mouth every 6 (six) hours as needed.  07/14/14   Historical Provider, MD  isosorbide mononitrate (IMDUR) 60 MG 24 hr tablet Take 0.5 tablets by mouth daily. 12/29/14   Historical Provider, MD    Patient Active Problem List   Diagnosis Date Noted  . Alopecia  02/11/2015  . Adaptation reaction 02/11/2015  . Acute MI 02/11/2015  . Arteriosclerosis of coronary artery 02/11/2015  . Clinical depression 02/11/2015  . Diabetes mellitus, type 2 02/11/2015  . Acid reflux 02/11/2015  . Gout 02/11/2015  . Hepatitis A 02/11/2015  . Asthma 02/11/2015  . HLD (hyperlipidemia) 02/11/2015  . BP (high blood pressure) 02/11/2015  . Cannot sleep 02/11/2015  . Arthritis, degenerative 02/11/2015  . Adiposity 02/11/2015  . Acute kidney failure 02/11/2015  . Apnea, sleep 02/11/2015  . CAD in native artery 12/20/2013    No past medical history on file.  History   Social History  . Marital Status: Divorced    Spouse Name: divorced  . Number of Children: 1  . Years of Education: 14   Occupational History  . retired    Social History Main Topics  . Smoking status: Never Smoker   . Smokeless tobacco: Never Used  . Alcohol Use: No  . Drug Use: No  . Sexual Activity: No   Other Topics Concern  . Not on file   Social History Narrative    Allergies  Allergen Reactions  . Amoxicillin   . Codeine     GI side effects  . Tagamet  [Cimetidine]     Causes changes in mental status    Review of Systems  Constitutional: Negative for fever, chills and malaise/fatigue.  Respiratory: Negative for cough, sputum production and shortness of breath.   Cardiovascular: Negative for chest pain (has angina off and  on when he feelsstressed), palpitations and leg swelling.  Musculoskeletal: Positive for joint pain. Negative for back pain and neck pain.  Neurological: Negative for dizziness, tingling, tremors, weakness and headaches.    Immunization History  Administered Date(s) Administered  . Pneumococcal Polysaccharide-23 03/15/2012  . Tdap 06/06/2007   Objective:  BP 116/62 mmHg  Pulse 86  Temp(Src) 98.7 F (37.1 C)  Resp 14  Ht 5' 9" (1.753 m)  Wt 321 lb (145.605 kg)  BMI 47.38 kg/m2  Physical Exam  Constitutional: He is oriented to person,  place, and time and well-developed, well-nourished, and in no distress. No distress.  HENT:  Head: Normocephalic.  Eyes: Conjunctivae are normal. Pupils are equal, round, and reactive to light.  Neck: Normal range of motion.  Cardiovascular: Normal rate, regular rhythm, normal heart sounds and intact distal pulses.   Pulmonary/Chest: Effort normal and breath sounds normal. He has no wheezes. He exhibits no tenderness.  Abdominal: Soft. There is no tenderness. There is no rebound.  Musculoskeletal: Normal range of motion. He exhibits edema (1+ edema bilateral).  Neurological: He is alert and oriented to person, place, and time. Gait normal.  Psychiatric: Affect and judgment normal.    Lab Results  Component Value Date   WBC 9.3 09/17/2014   HGB 10.6* 09/17/2014   HCT 32.9* 09/17/2014   PLT 207 09/17/2014   GLUCOSE 122* 09/17/2014   CHOL 97 11/08/2013   TRIG 133 11/08/2013   HDL 22* 11/08/2013   LDLCALC 48 11/08/2013   PSA 1.7 11/08/2013   INR 1.2 09/17/2014   HGBA1C 9.7* 11/08/2013    CMP     Component Value Date/Time   NA 140 09/17/2014 0339   NA 140 11/08/2013   NA 137 08/10/2007 0430   K 4.6 09/17/2014 0339   K 5.1 11/08/2013   CL 106 09/17/2014 0339   CL 102 08/10/2007 0430   CO2 26 09/17/2014 0339   CO2 30 08/10/2007 0430   GLUCOSE 122* 09/17/2014 0339   GLUCOSE 129* 08/10/2007 0430   BUN 26* 09/17/2014 0339   BUN 54* 11/08/2013   BUN 15 08/10/2007 0430   CREATININE 1.70* 09/17/2014 0339   CREATININE 1.8* 11/08/2013   CREATININE 1.13 08/10/2007 0430   CALCIUM 9.1 09/17/2014 0339   CALCIUM 8.4 08/10/2007 0430   PROT 8.5* 12/13/2013 1159   PROT 6.8 08/02/2007 1120   ALBUMIN 3.8 12/13/2013 1159   ALBUMIN 4.0 08/02/2007 1120   AST 15 12/13/2013 1159   AST 16 11/08/2013   ALT 21 12/13/2013 1159   ALT 18 11/08/2013   ALKPHOS 32* 12/13/2013 1159   ALKPHOS 30 11/08/2013   BILITOT 0.6 08/02/2007 1120   GFRNONAA 28* 12/16/2013 0725   GFRNONAA >60 08/10/2007  0430   GFRAA 33* 12/16/2013 0725   GFRAA  08/10/2007 0430    >60        The eGFR has been calculated using the MDRD equation. This calculation has not been validated in all clinical    Assessment and Plan :  1. Type 2 diabetes mellitus without complication U8Q 7.7 today. Patient is no longer seen Dr. Gabriel Carina and we will take over for now. Continue working on habits. - POCT HgB A1C  2. HLD (hyperlipidemia) Stable per recent check with Dr. Ubaldo Glassing.  3. Idiopathic gout, unspecified chronicity, unspecified site Refill provided. This used to be filled by Dr. Jacqlyn Larsen. - allopurinol (ZYLOPRIM) 100 MG tablet; Take 1 tablet (100 mg total) by mouth daily.  Dispense: 30 tablet; Refill:  12  4. Adiposity Work on habits.  5. Gastroesophageal reflux disease without esophagitis Stable. Nexium prn use.  CAD with angina  All risk factors treated. Followed by Dr. Ubaldo Glassing. And is a great difficulty with compliance through the years with any lifestyle modifications.  Patient was seen and examined by Dr. Eulas Post and note was scribed by Theressa Millard, RMA.    Miguel Aschoff MD [facility] 02/12/2015 3:02 PM

## 2015-04-14 ENCOUNTER — Other Ambulatory Visit: Payer: Self-pay

## 2015-04-14 MED ORDER — GLUCOSE BLOOD VI STRP: ORAL_STRIP | Status: DC

## 2015-05-14 ENCOUNTER — Other Ambulatory Visit: Payer: Self-pay

## 2015-05-14 ENCOUNTER — Encounter: Payer: Self-pay | Admitting: Family Medicine

## 2015-05-14 ENCOUNTER — Ambulatory Visit (INDEPENDENT_AMBULATORY_CARE_PROVIDER_SITE_OTHER): Payer: Medicare Other | Admitting: Family Medicine

## 2015-05-14 VITALS — BP 122/88 | HR 76 | Temp 98.1°F | Resp 16 | Wt 319.0 lb

## 2015-05-14 DIAGNOSIS — E785 Hyperlipidemia, unspecified: Secondary | ICD-10-CM

## 2015-05-14 DIAGNOSIS — E119 Type 2 diabetes mellitus without complications: Secondary | ICD-10-CM

## 2015-05-14 MED ORDER — FENOFIBRATE 160 MG PO TABS: 160.0000 mg | ORAL_TABLET | Freq: Every day | ORAL | Status: DC

## 2015-05-14 MED ORDER — PIOGLITAZONE HCL 15 MG PO TABS: 15.0000 mg | ORAL_TABLET | Freq: Every day | ORAL | Status: DC

## 2015-05-14 NOTE — Progress Notes (Signed)
Patient ID: Joshua Larsen, male   DOB: Jul 22, 1951, 64 y.o.   MRN: [id number]    Subjective:  HPI  Diabetes Mellitus Type II, Follow-up:   Lab Results  Component Value Date   HGBA1C 7.7 02/12/2015   HGBA1C 9.7* 11/08/2013   HGBA1C < 3.5* 10/10/2013    Last seen for diabetes 3 months ago.  Management since then includes none, pt has recently stopped Metformin ER because of the price. He reports poor compliance with treatment. He is not having side effects.  Current symptoms include Hyperglycemia. Home blood sugar records: ranging from 125-280  Episodes of hypoglycemia? no   Current Insulin Regimen: n/a Most Recent Eye Exam: 2-3 years  Current exercise: none  Pertinent Labs:    Component Value Date/Time   CHOL 97 11/08/2013   CHOL 115 10/10/2013 0430   TRIG 133 11/08/2013   TRIG 275* 10/10/2013 0430   CREATININE 1.70* 09/17/2014 0339   CREATININE 1.8* 11/08/2013   CREATININE 1.13 08/10/2007 0430    Wt Readings from Last 3 Encounters:  05/14/15 319 lb (144.697 kg)  02/12/15 321 lb (145.605 kg)  07/14/14 318 lb (144.244 kg)    ------------------------------------------------------------------------ Pt wants to wait on his A1c today, he is a day early for his A1c and does not know insurance will completely pay so he wants to be on the safe side and get a lab slip and go next week. He wants to discuss starting Metformin (regular not ER) he talked with his pharmacist and was told the ER was what was causing the Metformin to cost him so much the regular Metformin is only 16$. He wants to take that. He was taking Metformin ER 1,066m once daily.     Prior to Admission medications   Medication Sig Start Date End Date Taking? Authorizing Provider  allopurinol (ZYLOPRIM) 100 MG tablet Take 1 tablet (100 mg total) by mouth daily. 02/12/15   RJerrol Banana, MD  aspirin 81 MG tablet Take by mouth. 11/09/11   Historical Provider, MD  atorvastatin (LIPITOR) 20 MG tablet  Take by mouth.    Historical Provider, MD  clopidogrel (PLAVIX) 75 MG tablet Take by mouth.    Historical Provider, MD  esomeprazole (NEXIUM) 40 MG capsule Take 1 capsule by mouth daily as needed.    Historical Provider, MD  fenofibrate 160 MG tablet Take by mouth. 04/29/14   Historical Provider, MD  glucose blood (FREESTYLE TEST STRIPS) test strip Check sugar once daily 04/14/15   RJerrol Banana, MD  HYDROcodone-acetaminophen (NORCO/VICODIN) 5-325 MG per tablet Take 1 tablet by mouth every 6 (six) hours as needed.  07/14/14   Historical Provider, MD  isosorbide mononitrate (IMDUR) 60 MG 24 hr tablet Take 0.5 tablets by mouth daily. 12/29/14   Historical Provider, MD  metoprolol succinate (TOPROL-XL) 50 MG 24 hr tablet Take by mouth. 11/07/14   Historical Provider, MD  nitroGLYCERIN (NITROSTAT) 0.4 MG SL tablet Place under the tongue. 09/22/14 09/22/15  Historical Provider, MD  valsartan-hydrochlorothiazide (DIOVAN-HCT) 160-25 MG per tablet Take by mouth. 02/09/15   Historical Provider, MD    Patient Active Problem List   Diagnosis Date Noted  . Alopecia 02/11/2015  . Adaptation reaction 02/11/2015  . Acute MI 02/11/2015  . Arteriosclerosis of coronary artery 02/11/2015  . Clinical depression 02/11/2015  . Diabetes mellitus, type 2 02/11/2015  . Acid reflux 02/11/2015  . Gout 02/11/2015  . Hepatitis A 02/11/2015  . Asthma 02/11/2015  . HLD (hyperlipidemia) 02/11/2015  .  BP (high blood pressure) 02/11/2015  . Cannot sleep 02/11/2015  . Arthritis, degenerative 02/11/2015  . Adiposity 02/11/2015  . Acute kidney failure 02/11/2015  . Apnea, sleep 02/11/2015  . CAD in native artery 12/20/2013    History reviewed. No pertinent past medical history.  Social History   Social History  . Marital Status: Divorced    Spouse Name: divorced  . Number of Children: 1  . Years of Education: 14   Occupational History  . retired    Social History Main Topics  . Smoking status: Never Smoker    . Smokeless tobacco: Never Used  . Alcohol Use: No  . Drug Use: No  . Sexual Activity: No   Other Topics Concern  . Not on file   Social History Narrative    Allergies  Allergen Reactions  . Amoxicillin   . Codeine     GI side effects  . Tagamet  [Cimetidine]     Causes changes in mental status    Review of Systems  Constitutional: Negative.   Eyes: Negative.   Respiratory: Negative.   Cardiovascular: Negative.   Gastrointestinal: Negative.   Genitourinary: Negative.   Musculoskeletal: Positive for neck pain.  Skin: Negative.   Neurological: Negative.   Endo/Heme/Allergies: Negative.   Psychiatric/Behavioral: Negative.     Immunization History  Administered Date(s) Administered  . Pneumococcal Polysaccharide-23 03/15/2012  . Tdap 06/06/2007   Objective:  BP 122/88 mmHg  Pulse 76  Temp(Src) 98.1 F (36.7 C) (Oral)  Resp 16  Wt 319 lb (144.697 kg)  Physical Exam  Constitutional: He is oriented to person, place, and time and well-developed, well-nourished, and in no distress.  HENT:  Head: Normocephalic and atraumatic.  Right Ear: External ear normal.  Left Ear: External ear normal.  Nose: Nose normal.  Neck: Neck supple.  Cardiovascular: Normal rate, regular rhythm and normal heart sounds.   Pulmonary/Chest: Effort normal and breath sounds normal.  Abdominal: Soft.  Neurological: He is alert and oriented to person, place, and time.  Skin: Skin is warm and dry.  Psychiatric: Mood, memory, affect and judgment normal.    Lab Results  Component Value Date   WBC 9.3 09/17/2014   HGB 10.6* 09/17/2014   HCT 32.9* 09/17/2014   PLT 207 09/17/2014   GLUCOSE 122* 09/17/2014   CHOL 97 11/08/2013   TRIG 133 11/08/2013   HDL 22* 11/08/2013   LDLCALC 48 11/08/2013   PSA 1.7 11/08/2013   INR 1.2 09/17/2014   HGBA1C 7.7 02/12/2015    CMP     Component Value Date/Time   NA 140 09/17/2014 0339   NA 140 11/08/2013   NA 137 08/10/2007 0430   K 4.6  09/17/2014 0339   K 5.1 11/08/2013   CL 106 09/17/2014 0339   CL 102 08/10/2007 0430   CO2 26 09/17/2014 0339   CO2 30 08/10/2007 0430   GLUCOSE 122* 09/17/2014 0339   GLUCOSE 129* 08/10/2007 0430   BUN 26* 09/17/2014 0339   BUN 54* 11/08/2013   BUN 15 08/10/2007 0430   CREATININE 1.70* 09/17/2014 0339   CREATININE 1.8* 11/08/2013   CREATININE 1.13 08/10/2007 0430   CALCIUM 9.1 09/17/2014 0339   CALCIUM 8.4 08/10/2007 0430   PROT 8.5* 12/13/2013 1159   PROT 6.8 08/02/2007 1120   ALBUMIN 3.8 12/13/2013 1159   ALBUMIN 4.0 08/02/2007 1120   AST 15 12/13/2013 1159   AST 16 11/08/2013   ALT 21 12/13/2013 1159   ALT 18 11/08/2013  ALKPHOS 32* 12/13/2013 1159   ALKPHOS 30 11/08/2013   BILITOT 0.8 12/13/2013 1159   BILITOT 0.6 08/02/2007 1120   GFRNONAA 43* 09/17/2014 0339   GFRNONAA 28* 12/16/2013 0725   GFRNONAA >60 08/10/2007 0430   GFRAA 53* 09/17/2014 0339   GFRAA 33* 12/16/2013 0725   GFRAA  08/10/2007 0430    >60        The eGFR has been calculated using the MDRD equation. This calculation has not been validated in all clinical    Assessment and Plan :  Type 2 diabetes mellitus without complication - Plan: pioglitazone (ACTOS) 15 MG tablet, Hemoglobin A1c, CANCELED: Hemoglobin A1c  HLD (hyperlipidemia) - Plan: Lipid Panel With LDL/HDL Ratio, Comprehensive metabolic panel, CANCELED: Lipid Panel With LDL/HDL Ratio  CAD Risk factors treated Noncompliance with follow up Morbid obesty HLD Check labs. Miguel Aschoff MD [facility] 05/14/2015 2:33 PM

## 2015-06-09 ENCOUNTER — Telehealth: Payer: Self-pay

## 2015-06-09 LAB — HEMOGLOBIN A1C
Est. average glucose Bld gHb Est-mCnc: 151 mg/dL
HEMOGLOBIN A1C: 6.9 % — AB (ref 4.8–5.6)

## 2015-06-09 LAB — LIPID PANEL WITH LDL/HDL RATIO
CHOLESTEROL TOTAL: 115 mg/dL (ref 100–199)
HDL: 18 mg/dL — ABNORMAL LOW (ref >39)
LDL Calculated: 63 mg/dL (ref 0–99)
LDl/HDL Ratio: 3.5 ratio units (ref 0.0–3.6)
TRIGLYCERIDES: 168 mg/dL — AB (ref 0–149)
VLDL CHOLESTEROL CAL: 34 mg/dL (ref 5–40)

## 2015-06-09 LAB — COMPREHENSIVE METABOLIC PANEL
ALT: 15 IU/L (ref 0–44)
AST: 18 IU/L (ref 0–40)
Albumin/Globulin Ratio: 1.7 (ref 1.1–2.5)
Albumin: 4.3 g/dL (ref 3.6–4.8)
Alkaline Phosphatase: 37 IU/L — ABNORMAL LOW (ref 39–117)
BILIRUBIN TOTAL: 0.5 mg/dL (ref 0.0–1.2)
BUN/Creatinine Ratio: 19 (ref 10–22)
BUN: 32 mg/dL — AB (ref 8–27)
CHLORIDE: 103 mmol/L (ref 97–106)
CO2: 20 mmol/L (ref 18–29)
Calcium: 9.6 mg/dL (ref 8.6–10.2)
Creatinine, Ser: 1.69 mg/dL — ABNORMAL HIGH (ref 0.76–1.27)
GFR calc non Af Amer: 42 mL/min/{1.73_m2} — ABNORMAL LOW (ref >59)
GFR, EST AFRICAN AMERICAN: 49 mL/min/{1.73_m2} — AB (ref >59)
GLUCOSE: 146 mg/dL — AB (ref 65–99)
Globulin, Total: 2.6 g/dL (ref 1.5–4.5)
POTASSIUM: 4.8 mmol/L (ref 3.5–5.2)
Sodium: 142 mmol/L (ref 136–144)
TOTAL PROTEIN: 6.9 g/dL (ref 6.0–8.5)

## 2015-06-09 NOTE — Telephone Encounter (Signed)
LMTCB ED 

## 2015-06-09 NOTE — Telephone Encounter (Signed)
Pt advised-aa 

## 2015-06-09 NOTE — Telephone Encounter (Signed)
Pt is returning call.  CB#[phone]/MW

## 2015-06-09 NOTE — Telephone Encounter (Signed)
-----   Message from Maple Hudson., MD sent at 06/09/2015  8:06 AM EDT ----- Labs stable. Continue to work on diet and exercise.

## 2015-06-10 ENCOUNTER — Ambulatory Visit (INDEPENDENT_AMBULATORY_CARE_PROVIDER_SITE_OTHER): Payer: Medicare Other | Admitting: Family Medicine

## 2015-06-10 VITALS — BP 128/68 | HR 78 | Temp 98.3°F | Resp 16 | Ht 68.0 in | Wt 315.0 lb

## 2015-06-10 DIAGNOSIS — Z0000 Encounter for general adult medical examination without abnormal findings: Secondary | ICD-10-CM | POA: Diagnosis not present

## 2015-06-10 DIAGNOSIS — E119 Type 2 diabetes mellitus without complications: Secondary | ICD-10-CM | POA: Insufficient documentation

## 2015-06-10 DIAGNOSIS — Z125 Encounter for screening for malignant neoplasm of prostate: Secondary | ICD-10-CM | POA: Diagnosis not present

## 2015-06-10 DIAGNOSIS — Z1211 Encounter for screening for malignant neoplasm of colon: Secondary | ICD-10-CM

## 2015-06-10 LAB — IFOBT (OCCULT BLOOD): IMMUNOLOGICAL FECAL OCCULT BLOOD TEST: NEGATIVE

## 2015-06-10 NOTE — Progress Notes (Signed)
Patient ID: KAILER HEINDEL, male   DOB: 12-18-1950, 64 y.o.   MRN: [id number] Patient: Joshua Larsen, Male    DOB: [birth year], 64 y.o.   MRN: [id number] Visit Date: 06/10/2015  Today's Provider: Megan Mans, MD   Chief Complaint  Patient presents with  . Annual Exam   Subjective:  Joshua Larsen is a 64 y.o. male who presents today for health maintenance and complete physical. He feels fairly well. He reports exercising none. He reports he is sleeping well.   Review of Systems  Constitutional: Negative.   HENT: Negative.   Eyes: Negative.   Respiratory: Positive for apnea and cough. Negative for choking, chest tightness, shortness of breath, wheezing and stridor.   Cardiovascular: Positive for chest pain (Being worked up with Dr. Lady Gary, no new changes). Negative for palpitations and leg swelling.  Gastrointestinal: Negative.  Negative for abdominal pain, abdominal distention and anal bleeding.  Endocrine: Negative.   Genitourinary: Negative.   Musculoskeletal: Negative.   Skin: Negative.   Allergic/Immunologic: Negative.   Neurological: Negative.   Hematological: Negative.   Psychiatric/Behavioral: Negative.     Social History   Social History  . Marital Status: Divorced    Spouse Name: divorced  . Number of Children: 1  . Years of Education: 14   Occupational History  . retired    Social History Main Topics  . Smoking status: Never Smoker   . Smokeless tobacco: Never Used  . Alcohol Use: No  . Drug Use: No  . Sexual Activity: No   Other Topics Concern  . Not on file   Social History Narrative    Patient Active Problem List   Diagnosis Date Noted  . Type 2 diabetes mellitus (HCC) 06/10/2015  . Alopecia 02/11/2015  . Adaptation reaction 02/11/2015  . Acute MI (HCC) 02/11/2015  . Arteriosclerosis of coronary artery 02/11/2015  . Clinical depression 02/11/2015  . Diabetes mellitus, type 2 (HCC) 02/11/2015  . Acid reflux 02/11/2015  . Gout  02/11/2015  . Hepatitis A 02/11/2015  . Asthma 02/11/2015  . HLD (hyperlipidemia) 02/11/2015  . BP (high blood pressure) 02/11/2015  . Cannot sleep 02/11/2015  . Arthritis, degenerative 02/11/2015  . Adiposity 02/11/2015  . Acute kidney failure (HCC) 02/11/2015  . Apnea, sleep 02/11/2015  . Calculus of kidney 12/25/2013  . CAD in native artery 12/20/2013    Past Surgical History  Procedure Laterality Date  . Cardiac catheterization    . Back surgery    . Replacement total knee Left   . Coronary artery bypass graft    . Wrist surgery Right     His family history includes Arthritis in his mother; CAD in his father and mother; CVA in his father; Dementia in his sister; Heart attack in his father; Liver cancer in his mother.    Outpatient Prescriptions Prior to Visit  Medication Sig Dispense Refill  . allopurinol (ZYLOPRIM) 100 MG tablet Take 1 tablet (100 mg total) by mouth daily. 30 tablet 12  . aspirin 81 MG tablet Take by mouth.    Marland Kitchen atorvastatin (LIPITOR) 20 MG tablet Take by mouth.    . clopidogrel (PLAVIX) 75 MG tablet Take by mouth.    . esomeprazole (NEXIUM) 40 MG capsule Take 1 capsule by mouth daily as needed.    . fenofibrate 160 MG tablet Take 1 tablet (160 mg total) by mouth daily. 30 tablet 12  . glucose blood (FREESTYLE TEST STRIPS) test strip Check sugar once daily 100 each  12  . HYDROcodone-acetaminophen (NORCO/VICODIN) 5-325 MG per tablet Take 1 tablet by mouth every 6 (six) hours as needed.     . nitroGLYCERIN (NITROSTAT) 0.4 MG SL tablet Place under the tongue.    . pioglitazone (ACTOS) 15 MG tablet Take 1 tablet (15 mg total) by mouth daily. 30 tablet 12  . valsartan-hydrochlorothiazide (DIOVAN-HCT) 160-25 MG per tablet Take by mouth.    . isosorbide mononitrate (IMDUR) 60 MG 24 hr tablet Take 0.5 tablets by mouth daily.  0  . metoprolol succinate (TOPROL-XL) 50 MG 24 hr tablet Take by mouth.     No facility-administered medications prior to visit.     Patient Care Team: Maple Hudson., MD as PCP - General (Family Medicine)     Objective:   Vitals:  Filed Vitals:   06/10/15 1405  BP: 128/68  Pulse: 78  Temp: 98.3 F (36.8 C)  TempSrc: Oral  Resp: 16  Height: 5\' 8"  (1.727 m)  Weight: 315 lb (142.883 kg)    Physical Exam  Constitutional: He is oriented to person, place, and time. He appears well-developed and well-nourished.  HENT:  Head: Normocephalic and atraumatic.  Right Ear: External ear normal.  Left Ear: External ear normal.  Nose: Nose normal.  Mouth/Throat: Oropharynx is clear and moist.  Eyes: Conjunctivae and EOM are normal. Pupils are equal, round, and reactive to light.  Neck: Normal range of motion. Neck supple.  Cardiovascular: Normal rate, regular rhythm, normal heart sounds and intact distal pulses.   Pulmonary/Chest: Effort normal and breath sounds normal.  Abdominal: Soft. Bowel sounds are normal.  Genitourinary: Rectum normal, prostate normal and penis normal.  Musculoskeletal: Normal range of motion.  Neurological: He is alert and oriented to person, place, and time.  Skin: Skin is warm and dry.  Psychiatric: He has a normal mood and affect. His behavior is normal. Judgment and thought content normal.     Depression Screen PHQ 2/9 Scores 02/12/2015  PHQ - 2 Score 0      Assessment & Plan:     Routine Health Maintenance and Physical Exam  Exercise Activities and Dietary recommendations Goals    None      Immunization History  Administered Date(s) Administered  . Pneumococcal Polysaccharide-23 03/15/2012  . Tdap 06/06/2007    Health Maintenance  Topic Date Due  . Hepatitis C Screening  04/18/1951  . FOOT EXAM  03/27/1961  . OPHTHALMOLOGY EXAM  03/27/1961  . URINE MICROALBUMIN  03/27/1961  . HIV Screening  03/27/1966  . COLONOSCOPY  03/27/2001  . ZOSTAVAX  03/28/2011  . INFLUENZA VACCINE  05/13/2016 (Originally 03/23/2015)  . HEMOGLOBIN A1C  12/07/2015  .  TETANUS/TDAP  06/05/2017      Discussed health benefits of physical activity, and encouraged him to engage in regular exercise appropriate for his age and condition.    ------------------------------------------------------------------------------------------------------------

## 2015-06-11 ENCOUNTER — Ambulatory Visit: Payer: Medicare Other | Admitting: Family Medicine

## 2015-06-16 LAB — TSH: TSH: 0.897 u[IU]/mL (ref 0.450–4.500)

## 2015-06-16 LAB — PSA: PROSTATE SPECIFIC AG, SERUM: 2.8 ng/mL (ref 0.0–4.0)

## 2015-06-18 ENCOUNTER — Emergency Department: Payer: Medicare Other

## 2015-06-18 ENCOUNTER — Inpatient Hospital Stay
Admission: EM | Admit: 2015-06-18 | Discharge: 2015-06-21 | DRG: 948 | Disposition: A | Discharge status: Home / Self Care | Payer: Medicare Other | Attending: Internal Medicine | Admitting: Internal Medicine

## 2015-06-18 ENCOUNTER — Encounter: Payer: Self-pay | Admitting: Medical Oncology

## 2015-06-18 DIAGNOSIS — I214 Non-ST elevation (NSTEMI) myocardial infarction: Secondary | ICD-10-CM

## 2015-06-18 DIAGNOSIS — I129 Hypertensive chronic kidney disease with stage 1 through stage 4 chronic kidney disease, or unspecified chronic kidney disease: Secondary | ICD-10-CM | POA: Diagnosis present

## 2015-06-18 DIAGNOSIS — Z7982 Long term (current) use of aspirin: Secondary | ICD-10-CM

## 2015-06-18 DIAGNOSIS — G4733 Obstructive sleep apnea (adult) (pediatric): Secondary | ICD-10-CM | POA: Diagnosis present

## 2015-06-18 DIAGNOSIS — N183 Chronic kidney disease, stage 3 (moderate): Secondary | ICD-10-CM | POA: Diagnosis present

## 2015-06-18 DIAGNOSIS — I25119 Atherosclerotic heart disease of native coronary artery with unspecified angina pectoris: Secondary | ICD-10-CM | POA: Diagnosis present

## 2015-06-18 DIAGNOSIS — E119 Type 2 diabetes mellitus without complications: Secondary | ICD-10-CM

## 2015-06-18 DIAGNOSIS — Z9889 Other specified postprocedural states: Secondary | ICD-10-CM | POA: Diagnosis not present

## 2015-06-18 DIAGNOSIS — R42 Dizziness and giddiness: Secondary | ICD-10-CM | POA: Diagnosis present

## 2015-06-18 DIAGNOSIS — Z96652 Presence of left artificial knee joint: Secondary | ICD-10-CM | POA: Diagnosis present

## 2015-06-18 DIAGNOSIS — Z800 Family history of malignant neoplasm of digestive organs: Secondary | ICD-10-CM

## 2015-06-18 DIAGNOSIS — I10 Essential (primary) hypertension: Secondary | ICD-10-CM

## 2015-06-18 DIAGNOSIS — R001 Bradycardia, unspecified: Secondary | ICD-10-CM

## 2015-06-18 DIAGNOSIS — R748 Abnormal levels of other serum enzymes: Secondary | ICD-10-CM | POA: Diagnosis present

## 2015-06-18 DIAGNOSIS — I680 Cerebral amyloid angiopathy: Secondary | ICD-10-CM | POA: Diagnosis present

## 2015-06-18 DIAGNOSIS — Z823 Family history of stroke: Secondary | ICD-10-CM

## 2015-06-18 DIAGNOSIS — Z8249 Family history of ischemic heart disease and other diseases of the circulatory system: Secondary | ICD-10-CM

## 2015-06-18 DIAGNOSIS — Z886 Allergy status to analgesic agent status: Secondary | ICD-10-CM | POA: Diagnosis not present

## 2015-06-18 DIAGNOSIS — R778 Other specified abnormalities of plasma proteins: Secondary | ICD-10-CM | POA: Diagnosis present

## 2015-06-18 DIAGNOSIS — E1122 Type 2 diabetes mellitus with diabetic chronic kidney disease: Secondary | ICD-10-CM | POA: Diagnosis present

## 2015-06-18 DIAGNOSIS — E785 Hyperlipidemia, unspecified: Secondary | ICD-10-CM | POA: Diagnosis present

## 2015-06-18 DIAGNOSIS — Z79899 Other long term (current) drug therapy: Secondary | ICD-10-CM

## 2015-06-18 DIAGNOSIS — R7989 Other specified abnormal findings of blood chemistry: Secondary | ICD-10-CM

## 2015-06-18 DIAGNOSIS — Z951 Presence of aortocoronary bypass graft: Secondary | ICD-10-CM | POA: Diagnosis not present

## 2015-06-18 DIAGNOSIS — I252 Old myocardial infarction: Secondary | ICD-10-CM | POA: Diagnosis not present

## 2015-06-18 DIAGNOSIS — Z888 Allergy status to other drugs, medicaments and biological substances status: Secondary | ICD-10-CM | POA: Diagnosis not present

## 2015-06-18 DIAGNOSIS — Z880 Allergy status to penicillin: Secondary | ICD-10-CM | POA: Diagnosis not present

## 2015-06-18 LAB — APTT: aPTT: 34 seconds (ref 24–36)

## 2015-06-18 LAB — CBC
HCT: 35.5 % — ABNORMAL LOW (ref 40.0–52.0)
Hemoglobin: 11.5 g/dL — ABNORMAL LOW (ref 13.0–18.0)
MCH: 25 pg — AB (ref 26.0–34.0)
MCHC: 32.3 g/dL (ref 32.0–36.0)
MCV: 77.5 fL — AB (ref 80.0–100.0)
PLATELETS: 179 10*3/uL (ref 150–440)
RBC: 4.58 MIL/uL (ref 4.40–5.90)
RDW: 17 % — AB (ref 11.5–14.5)
WBC: 6.8 10*3/uL (ref 3.8–10.6)

## 2015-06-18 LAB — BASIC METABOLIC PANEL
Anion gap: 6 (ref 5–15)
BUN: 32 mg/dL — AB (ref 6–20)
CHLORIDE: 110 mmol/L (ref 101–111)
CO2: 23 mmol/L (ref 22–32)
CREATININE: 1.77 mg/dL — AB (ref 0.61–1.24)
Calcium: 9.1 mg/dL (ref 8.9–10.3)
GFR calc Af Amer: 45 mL/min — ABNORMAL LOW (ref >60)
GFR calc non Af Amer: 39 mL/min — ABNORMAL LOW (ref >60)
Glucose, Bld: 138 mg/dL — ABNORMAL HIGH (ref 65–99)
Potassium: 4.5 mmol/L (ref 3.5–5.1)
SODIUM: 139 mmol/L (ref 135–145)

## 2015-06-18 LAB — TROPONIN I
Troponin I: 0.21 ng/mL — ABNORMAL HIGH (ref <0.031)
Troponin I: 0.23 ng/mL — ABNORMAL HIGH (ref <0.031)

## 2015-06-18 LAB — PROTIME-INR
INR: 1.11
Prothrombin Time: 14.5 seconds (ref 11.4–15.0)

## 2015-06-18 LAB — HEPATIC FUNCTION PANEL
ALK PHOS: 33 U/L — AB (ref 38–126)
ALT: 24 U/L (ref 17–63)
AST: 24 U/L (ref 15–41)
Albumin: 4 g/dL (ref 3.5–5.0)
BILIRUBIN DIRECT: 0.1 mg/dL (ref 0.1–0.5)
BILIRUBIN INDIRECT: 0.6 mg/dL (ref 0.3–0.9)
BILIRUBIN TOTAL: 0.7 mg/dL (ref 0.3–1.2)
Total Protein: 7.4 g/dL (ref 6.5–8.1)

## 2015-06-18 LAB — LIPASE, BLOOD: Lipase: 40 U/L (ref 11–51)

## 2015-06-18 MED ORDER — ASPIRIN 81 MG PO CHEW: 324.0000 mg | CHEWABLE_TABLET | ORAL | Status: AC

## 2015-06-18 MED ORDER — HYDROCODONE-ACETAMINOPHEN 5-325 MG PO TABS: 1.0000 | ORAL_TABLET | Freq: Four times a day (QID) | ORAL | Status: DC | PRN

## 2015-06-18 MED ORDER — ASPIRIN EC 81 MG PO TBEC
81.0000 mg | DELAYED_RELEASE_TABLET | Freq: Every day | ORAL | Status: DC
  Administered 2015-06-19 – 2015-06-21 (×3): 81 mg via ORAL
  Filled 2015-06-18 (×3): qty 1

## 2015-06-18 MED ORDER — PANTOPRAZOLE SODIUM 40 MG PO TBEC
40.0000 mg | DELAYED_RELEASE_TABLET | Freq: Every day | ORAL | Status: DC
  Administered 2015-06-19 – 2015-06-21 (×3): 40 mg via ORAL
  Filled 2015-06-18 (×3): qty 1

## 2015-06-18 MED ORDER — MECLIZINE HCL 25 MG PO TABS
25.0000 mg | ORAL_TABLET | Freq: Once | ORAL | Status: AC
  Administered 2015-06-18: 25 mg via ORAL
  Filled 2015-06-18: qty 1

## 2015-06-18 MED ORDER — FENOFIBRATE 160 MG PO TABS
160.0000 mg | ORAL_TABLET | Freq: Every day | ORAL | Status: DC
  Administered 2015-06-18 – 2015-06-21 (×4): 160 mg via ORAL
  Filled 2015-06-18 (×4): qty 1

## 2015-06-18 MED ORDER — CLOPIDOGREL BISULFATE 75 MG PO TABS
75.0000 mg | ORAL_TABLET | Freq: Every day | ORAL | Status: DC
  Administered 2015-06-19 – 2015-06-21 (×3): 75 mg via ORAL
  Filled 2015-06-18 (×3): qty 1

## 2015-06-18 MED ORDER — NITROGLYCERIN 0.4 MG SL SUBL: 0.4000 mg | SUBLINGUAL_TABLET | SUBLINGUAL | Status: DC | PRN

## 2015-06-18 MED ORDER — HEPARIN BOLUS VIA INFUSION
4000.0000 [IU] | Freq: Once | INTRAVENOUS | Status: AC
  Administered 2015-06-18: 4000 [IU] via INTRAVENOUS
  Filled 2015-06-18: qty 4000

## 2015-06-18 MED ORDER — ATORVASTATIN CALCIUM 20 MG PO TABS
20.0000 mg | ORAL_TABLET | Freq: Every evening | ORAL | Status: DC
  Administered 2015-06-18: 20 mg via ORAL
  Filled 2015-06-18: qty 1

## 2015-06-18 MED ORDER — ISOSORBIDE MONONITRATE ER 60 MG PO TB24
60.0000 mg | ORAL_TABLET | Freq: Two times a day (BID) | ORAL | Status: DC
  Administered 2015-06-18 – 2015-06-21 (×6): 60 mg via ORAL
  Filled 2015-06-18 (×6): qty 1

## 2015-06-18 MED ORDER — SODIUM CHLORIDE 0.9 % IJ SOLN: 3.0000 mL | INTRAMUSCULAR | Status: DC | PRN

## 2015-06-18 MED ORDER — ONDANSETRON HCL 4 MG/2ML IJ SOLN: 4.0000 mg | Freq: Four times a day (QID) | INTRAMUSCULAR | Status: DC | PRN

## 2015-06-18 MED ORDER — ASPIRIN 81 MG PO CHEW
324.0000 mg | CHEWABLE_TABLET | Freq: Once | ORAL | Status: AC
  Administered 2015-06-18: 324 mg via ORAL
  Filled 2015-06-18: qty 4

## 2015-06-18 MED ORDER — SODIUM CHLORIDE 0.9 % IV SOLN: 250.0000 mL | INTRAVENOUS | Status: DC | PRN

## 2015-06-18 MED ORDER — ACETAMINOPHEN 325 MG PO TABS: 650.0000 mg | ORAL_TABLET | ORAL | Status: DC | PRN

## 2015-06-18 MED ORDER — VALSARTAN-HYDROCHLOROTHIAZIDE 160-25 MG PO TABS: 1.0000 | ORAL_TABLET | Freq: Every day | ORAL | Status: DC

## 2015-06-18 MED ORDER — METOPROLOL SUCCINATE ER 50 MG PO TB24
50.0000 mg | ORAL_TABLET | Freq: Two times a day (BID) | ORAL | Status: DC
  Administered 2015-06-18: 50 mg via ORAL
  Filled 2015-06-18: qty 1

## 2015-06-18 MED ORDER — ASPIRIN 300 MG RE SUPP: 300.0000 mg | RECTAL | Status: AC

## 2015-06-18 MED ORDER — ALLOPURINOL 100 MG PO TABS
100.0000 mg | ORAL_TABLET | Freq: Every day | ORAL | Status: DC
  Administered 2015-06-19 – 2015-06-21 (×3): 100 mg via ORAL
  Filled 2015-06-18 (×4): qty 1

## 2015-06-18 MED ORDER — HYDROCHLOROTHIAZIDE 25 MG PO TABS
25.0000 mg | ORAL_TABLET | Freq: Every day | ORAL | Status: DC
  Administered 2015-06-19: 25 mg via ORAL
  Filled 2015-06-18: qty 1

## 2015-06-18 MED ORDER — HEPARIN (PORCINE) IN NACL 100-0.45 UNIT/ML-% IJ SOLN
1750.0000 [IU]/h | INTRAMUSCULAR | Status: DC
  Administered 2015-06-18 (×2): 1350 [IU]/h via INTRAVENOUS
  Filled 2015-06-18 (×2): qty 250

## 2015-06-18 MED ORDER — IRBESARTAN 150 MG PO TABS
150.0000 mg | ORAL_TABLET | Freq: Every day | ORAL | Status: DC
Start: 2015-06-18 — End: 2015-06-19
  Administered 2015-06-19: 150 mg via ORAL
  Filled 2015-06-18: qty 1

## 2015-06-18 MED ORDER — SODIUM CHLORIDE 0.9 % IJ SOLN
3.0000 mL | Freq: Two times a day (BID) | INTRAMUSCULAR | Status: DC
  Administered 2015-06-19 – 2015-06-20 (×3): 3 mL via INTRAVENOUS
  Administered 2015-06-20: 6 mL via INTRAVENOUS
  Administered 2015-06-21: 3 mL via INTRAVENOUS

## 2015-06-18 NOTE — ED Notes (Signed)
Pt states he has hx of cardiac issues and has chronic chest pain rated at 2 or 3. He came to the ED today because he is having dizziness and difficulty sitting up and standing. Also stated that he has vomited twice today which is not normal.

## 2015-06-18 NOTE — ED Notes (Signed)
Pt returned from xr

## 2015-06-18 NOTE — ED Notes (Signed)
Dr forbach at bedside. 

## 2015-06-18 NOTE — ED Notes (Signed)
Lab called with critical troponin of 0.21. MD notified.

## 2015-06-18 NOTE — ED Notes (Signed)
Pt reports that he has a hx of angina however this am the pain to his chest has worsened and pt states that he has been dizzy with nausea.

## 2015-06-18 NOTE — Progress Notes (Addendum)
ANTICOAGULATION CONSULT NOTE - Initial Consult  Pharmacy Consult for Heparin Indication: chest pain/ACS  Allergies  Allergen Reactions  . Amoxicillin   . Codeine     GI side effects  . Tagamet  [Cimetidine] Other (See Comments)    Causes changes in mental status Causes changes in mental status    Patient Measurements: Height: 5\' 9"  (175.3 cm) Weight: (!) 308 lb (139.708 kg) IBW/kg (Calculated) : 70.7 Heparin Dosing Weight: 103.8 kg   Vital Signs: Temp: 97.7 F (36.5 C) (10/27 1708) Temp Source: Oral (10/27 1708) BP: 112/59 mmHg (10/27 1830) Pulse Rate: 72 (10/27 1845)  Labs:  Recent Labs  06/18/15 1748  HGB 11.5*  HCT 35.5*  PLT 179  LABPROT 14.5  INR 1.11  CREATININE 1.77*  TROPONINI 0.21*    Estimated Creatinine Clearance: 58.6 mL/min (by C-G formula based on Cr of 1.77).   Medical History: Past Medical History  Diagnosis Date  . Hypertension     Medications:   (Not in a hospital admission)  Assessment: Pharmacy consulted to dose heparin in this 64 year old male admitted with ACS. CrCl = 58.6 ml/min  Goal of Therapy:  Heparin level 0.3-0.7 units/ml Monitor platelets by anticoagulation protocol: Yes   Plan:  Give 4000 units bolus x 1 Start heparin infusion at 1350 units/hr  Will order baseline aPTT.  Will draw HL 6 hrs after start of drip on 10/28 @ 2:00.   Catina Nuss D 06/18/2015,7:01 PM

## 2015-06-18 NOTE — H&P (Signed)
[facility] [place] [facility]   PATIENT NAME: Joshua Larsen    MR#:  [id number]  DATE OF BIRTH:  Nov 14, 1950  DATE OF ADMISSION:  06/18/2015  PRIMARY CARE PHYSICIAN: Megan Mans, MD   REQUESTING/REFERRING PHYSICIAN: Dr. York Cerise.  CHIEF COMPLAINT:   Chief Complaint  Patient presents with  . Chest Pain   chest pressure and dizziness today.  HISTORY OF PRESENT ILLNESS:  Joshua Larsen  is a 64 y.o. male with a known history of hypertension, CAD and recent MI status post stent. Patient come to the ED due to mild pressure-like chest pain about 2-3 out of 10. Patient also complains of dizziness and nausea. Denies any diaphoresis, vomiting or palpitation. The patient troponin is elevated at 0.21. He has no complaints of chest pain for now. He denies any orthopnea, nocturnal dyspnea or leg edema. He denies any shortness of breath.  PAST MEDICAL HISTORY:   Past Medical History  Diagnosis Date  . Hypertension     PAST SURGICAL HISTORY:   Past Surgical History  Procedure Laterality Date  . Cardiac catheterization    . Back surgery    . Replacement total knee Left   . Coronary artery bypass graft    . Wrist surgery Right     SOCIAL HISTORY:   Social History  Substance Use Topics  . Smoking status: Never Smoker   . Smokeless tobacco: Never Used  . Alcohol Use: No    FAMILY HISTORY:   Family History  Problem Relation Age of Onset  . Liver cancer Mother   . CAD Mother   . Arthritis Mother   . Heart attack Father   . CVA Father   . CAD Father   . Dementia Sister     DRUG ALLERGIES:   Allergies  Allergen Reactions  . Amoxicillin   . Codeine     GI side effects  . Tagamet  [Cimetidine] Other (See Comments)    Causes changes in mental status Causes changes in mental status    REVIEW OF SYSTEMS:  CONSTITUTIONAL: No fever, has generalized weakness.  EYES: No blurred or double vision.  EARS, NOSE, AND THROAT: No tinnitus  or ear pain.  RESPIRATORY: No cough, shortness of breath, wheezing or hemoptysis.  CARDIOVASCULAR: Has pressure-like chest pain, no orthopnea, edema.  GASTROINTESTINAL: Has nausea, no vomiting, diarrhea or abdominal pain.  GENITOURINARY: No dysuria, hematuria.  ENDOCRINE: No polyuria, nocturia,  HEMATOLOGY: No anemia, easy bruising or bleeding SKIN: No rash or lesion. MUSCULOSKELETAL: No joint pain or arthritis.   NEUROLOGIC: No tingling, numbness, weakness.  PSYCHIATRY: No anxiety or depression.   MEDICATIONS AT HOME:   Prior to Admission medications   Medication Sig Start Date End Date Taking? Authorizing Provider  allopurinol (ZYLOPRIM) 100 MG tablet Take 1 tablet (100 mg total) by mouth daily. 02/12/15  Yes Richard Hulen Shouts., MD  aspirin EC 81 MG tablet Take 81 mg by mouth daily.   Yes Historical Provider, MD  atorvastatin (LIPITOR) 20 MG tablet Take 20 mg by mouth every evening.    Yes Historical Provider, MD  clopidogrel (PLAVIX) 75 MG tablet Take 75 mg by mouth daily.    Yes Historical Provider, MD  esomeprazole (NEXIUM) 40 MG capsule Take 1 capsule by mouth daily as needed (for acid reflux).    Yes Historical Provider, MD  fenofibrate 160 MG tablet Take 1 tablet (160 mg total) by mouth daily. Patient taking differently: Take 160 mg by mouth every  evening.  05/14/15  Yes Richard Hulen Shouts., MD  glucose blood (FREESTYLE TEST STRIPS) test strip Check sugar once daily 04/14/15  Yes Richard Hulen Shouts., MD  HYDROcodone-acetaminophen (NORCO/VICODIN) 5-325 MG per tablet Take 1 tablet by mouth every 6 (six) hours as needed for moderate pain or severe pain.    Yes Historical Provider, MD  isosorbide mononitrate (IMDUR) 60 MG 24 hr tablet Take 60 mg by mouth 2 (two) times daily.   Yes Historical Provider, MD  metoprolol succinate (TOPROL-XL) 50 MG 24 hr tablet Take 50 mg by mouth 2 (two) times daily.   Yes Historical Provider, MD  nitroGLYCERIN (NITROSTAT) 0.4 MG SL tablet Place 0.4 mg  under the tongue every 5 (five) minutes as needed for chest pain.    Yes Historical Provider, MD  valsartan-hydrochlorothiazide (DIOVAN-HCT) 160-25 MG per tablet Take 1 tablet by mouth daily.    Yes Historical Provider, MD  pioglitazone (ACTOS) 15 MG tablet Take 1 tablet (15 mg total) by mouth daily. Patient not taking: Reported on 06/18/2015 05/14/15   Maple Hudson., MD      VITAL SIGNS:  Blood pressure 141/63, pulse 65, temperature 98.3 F (36.8 C), temperature source Oral, resp. rate 20, height 5\' 9"  (1.753 m), weight 139.708 kg (308 lb), SpO2 100 %.  PHYSICAL EXAMINATION:  GENERAL:  64 y.o.-year-old patient lying in the bed with no acute distress. Obese. EYES: Pupils equal, round, reactive to light and accommodation. No scleral icterus. Extraocular muscles intact.  HEENT: Head atraumatic, normocephalic. Oropharynx and nasopharynx clear. Moist oral mucosa. NECK:  Supple, no jugular venous distention. No thyroid enlargement, no tenderness.  LUNGS: Normal breath sounds bilaterally, no wheezing, rales,rhonchi or crepitation. No use of accessory muscles of respiration.  CARDIOVASCULAR: S1, S2 normal. No murmurs, rubs, or gallops.  ABDOMEN: Soft, nontender, nondistended. Bowel sounds present. No organomegaly or mass.  EXTREMITIES: No pedal edema, cyanosis, or clubbing.  NEUROLOGIC: Cranial nerves II through XII are intact. Muscle strength 5/5 in all extremities. Sensation intact. Gait not checked.  PSYCHIATRIC: The patient is alert and oriented x 3.  SKIN: No obvious rash, lesion, or ulcer.   LABORATORY PANEL:   CBC  Recent Labs Lab 06/18/15 1748  WBC 6.8  HGB 11.5*  HCT 35.5*  PLT 179   ------------------------------------------------------------------------------------------------------------------  Chemistries   Recent Labs Lab 06/18/15 1748  NA 139  K 4.5  CL 110  CO2 23  GLUCOSE 138*  BUN 32*  CREATININE 1.77*  CALCIUM 9.1  AST 24  ALT 24  ALKPHOS 33*   BILITOT 0.7   ------------------------------------------------------------------------------------------------------------------  Cardiac Enzymes  Recent Labs Lab 06/18/15 1748  TROPONINI 0.21*   ------------------------------------------------------------------------------------------------------------------  RADIOLOGY:  Dg Chest 2 View  06/18/2015  CLINICAL DATA:  Chronic chest pain. Dizziness and difficulty standing. EXAM: CHEST  2 VIEW COMPARISON:  CT of the chest 09/16/2014 FINDINGS: Postsurgical changes from CABG are stable. Cardiomediastinal silhouette is normal. Mediastinal contours appear intact. There is no evidence of focal airspace consolidation, pleural effusion or pneumothorax. Osseous structures are without acute abnormality. Soft tissues are grossly normal. IMPRESSION: No radiographic evidence of acute cardiopulmonary abnormality. Electronically Signed   By: Ted Mcalpine M.D.   On: 06/18/2015 17:38    EKG:   Orders placed or performed during the hospital encounter of 06/18/15  . EKG 12-Lead  . EKG 12-Lead  . EKG 12-Lead  . EKG 12-Lead  . ED EKG within 10 minutes  . ED EKG within 10 minutes  IMPRESSION AND PLAN:   Non-STEMI CAD Hypertension Diabetes I per lipidemia CKD stage III OSA Morbid obesity  The patient will be admitted to telemetry floor. I will start ACS protocol, start heparin drip, continue aspirin, Plavix and statin,  follow-up troponin level and get cardiology consult. Continue Lopressor, imdur, irbesartan and nitroglycerin when necessary.    All the records are reviewed and case discussed with ED provider. Management plans discussed with the patient, family and they are in agreement.  CODE STATUS: Full code  TOTAL TIME TAKING CARE OF THIS PATIENT: 56 minutes.    Shaune Pollack M.D on 06/18/2015 at 8:37 PM  Between 7am to 6pm - Pager - [phone]  After 6pm go to [email or web address] - password [email or web address]  [place] [facility]  Office  [phone]  CC: Primary care physician; Megan Mans, MD

## 2015-06-18 NOTE — ED Notes (Signed)
Pt to xr 

## 2015-06-18 NOTE — ED Provider Notes (Signed)
[facility] Emergency Department Provider Note  ____________________________________________  Time seen: Approximately 5:27 PM  I have reviewed the triage vital signs and the nursing notes.   HISTORY  Chief Complaint Chest Pain    HPI Joshua Larsen is a 64 y.o. male with a history of numerous chronic cardiac issues including a diagnosis of chronic chest pain who is followed by Dr. Lady Gary as well as his local PCP.  He was seen about a week ago and had a "clean physical exam".  He presents today because he feltvertiginous this morning after getting up.  He states that he was having some ankle pain last night and took an indomethacin.  He states that indomethacin has in the past made him feel lightheaded and vertiginous, but what he felt when he awoke this morning was "100 times worse".  He states that he was fine if he was lying down, but as soon as he would sit up or try to stand up he would become very dizzy and could not maintain his balance.  He went back to bed and slept for a few hours.  He felt better at that point and got up and had some breakfast and then got back in bed.  Each time his symptoms are significantly worse with change of position.  He went back to sleep and then when he awoke he tried to get up again and still felt vertiginous so he called EMS for evaluation the emergency department.  He states that the feeling of dizziness/vertigo was making him "panic", and every time he panics then he starts having chest pain.  He did have some mild chest pain earlier but states that it feels similar to prior, mild, sharp, essentially located.  He had some nausea along with the vertigo, no focal weakness, and no other concerning symptoms.   Past Medical History  Diagnosis Date  . Hypertension     Patient Active Problem List   Diagnosis Date Noted  . NSTEMI (non-ST elevated myocardial infarction) (HCC) 06/18/2015  . Type 2 diabetes mellitus (HCC) 06/10/2015   . Alopecia 02/11/2015  . Adaptation reaction 02/11/2015  . Acute MI (HCC) 02/11/2015  . Arteriosclerosis of coronary artery 02/11/2015  . Clinical depression 02/11/2015  . Diabetes mellitus, type 2 (HCC) 02/11/2015  . Acid reflux 02/11/2015  . Gout 02/11/2015  . Hepatitis A 02/11/2015  . Asthma 02/11/2015  . HLD (hyperlipidemia) 02/11/2015  . BP (high blood pressure) 02/11/2015  . Cannot sleep 02/11/2015  . Arthritis, degenerative 02/11/2015  . Adiposity 02/11/2015  . Acute kidney failure (HCC) 02/11/2015  . Apnea, sleep 02/11/2015  . Calculus of kidney 12/25/2013  . CAD in native artery 12/20/2013    Past Surgical History  Procedure Laterality Date  . Cardiac catheterization    . Back surgery    . Replacement total knee Left   . Coronary artery bypass graft    . Wrist surgery Right     No current outpatient prescriptions on file.  Allergies Amoxicillin; Codeine; and Tagamet   Family History  Problem Relation Age of Onset  . Liver cancer Mother   . CAD Mother   . Arthritis Mother   . Heart attack Father   . CVA Father   . CAD Father   . Dementia Sister     Social History Social History  Substance Use Topics  . Smoking status: Never Smoker   . Smokeless tobacco: Never Used  . Alcohol Use: No    Review  of Systems Constitutional: No fever/chills Eyes: No visual changes. ENT: No sore throat. Cardiovascular: Social sharp chest pain similar to prior Respiratory: Denies shortness of breath. Gastrointestinal: No abdominal pain.  No nausea, no vomiting.  No diarrhea.  No constipation. Genitourinary: Negative for dysuria. Musculoskeletal: Negative for back pain. Skin: Negative for rash. Neurological: Negative for headaches, focal weakness or numbness.  Severe vertigo with position changes  10-point ROS otherwise negative.  ____________________________________________   PHYSICAL EXAM:  VITAL SIGNS: ED Triage Vitals  Enc Vitals Group     BP 06/18/15  1708 152/60 mmHg     Pulse Rate 06/18/15 1708 60     Resp 06/18/15 1708 18     Temp 06/18/15 1708 97.7 F (36.5 C)     Temp Source 06/18/15 1708 Oral     SpO2 06/18/15 1708 99 %     Weight 06/18/15 1704 308 lb (139.708 kg)     Height 06/18/15 1704 5\' 9"  (1.753 m)     Head Cir --      Peak Flow --      Pain Score 06/18/15 1705 3     Pain Loc --      Pain Edu? --      Excl. in GC? --     Constitutional: Alert and oriented. Well appearing and in no acute distress. Eyes: Conjunctivae are normal. PERRL. EOMI. no nystagmus. Head: Atraumatic. Nose: No congestion/rhinnorhea. Mouth/Throat: Mucous membranes are moist.  Oropharynx non-erythematous. Neck: No stridor.   Cardiovascular: Normal rate, regular rhythm. Grossly normal heart sounds.  Good peripheral circulation. Respiratory: Normal respiratory effort.  No retractions. Lungs CTAB. Gastrointestinal: Orbit obesity.  Soft and nontender. No distention. No abdominal bruits. No CVA tenderness. Musculoskeletal: No lower extremity tenderness nor edema.  No joint effusions. Neurologic:  Normal speech and language. No gross focal neurologic deficits are appreciated.  Skin:  Skin is warm, dry and intact. No rash noted. Psychiatric: Mood and affect are normal. Speech and behavior are normal.  ____________________________________________   LABS (all labs ordered are listed, but only abnormal results are displayed)  Labs Reviewed  BASIC METABOLIC PANEL - Abnormal; Notable for the following:    Glucose, Bld 138 (*)    BUN 32 (*)    Creatinine, Ser 1.77 (*)    GFR calc non Af Amer 39 (*)    GFR calc Af Amer 45 (*)    All other components within normal limits  CBC - Abnormal; Notable for the following:    Hemoglobin 11.5 (*)    HCT 35.5 (*)    MCV 77.5 (*)    MCH 25.0 (*)    RDW 17.0 (*)    All other components within normal limits  TROPONIN I - Abnormal; Notable for the following:    Troponin I 0.21 (*)    All other components within  normal limits  HEPATIC FUNCTION PANEL - Abnormal; Notable for the following:    Alkaline Phosphatase 33 (*)    All other components within normal limits  TROPONIN I - Abnormal; Notable for the following:    Troponin I 0.23 (*)    All other components within normal limits  PROTIME-INR  LIPASE, BLOOD  APTT  TROPONIN I  TROPONIN I  CBC  HEPARIN LEVEL (UNFRACTIONATED)   ____________________________________________  EKG  ED ECG REPORT I, Wendelin Reader, the attending physician, personally viewed and interpreted this ECG.  Date: 06/18/2015 EKG Time: 17:07 Rate: 54 Rhythm: Sinus bradycardia QRS Axis: normal Intervals: normal ST/T Wave  abnormalities: normal Conduction Disutrbances: none Narrative Interpretation: unremarkable  ____________________________________________  RADIOLOGY   Dg Chest 2 View  06/18/2015  CLINICAL DATA:  Chronic chest pain. Dizziness and difficulty standing. EXAM: CHEST  2 VIEW COMPARISON:  CT of the chest 09/16/2014 FINDINGS: Postsurgical changes from CABG are stable. Cardiomediastinal silhouette is normal. Mediastinal contours appear intact. There is no evidence of focal airspace consolidation, pleural effusion or pneumothorax. Osseous structures are without acute abnormality. Soft tissues are grossly normal. IMPRESSION: No radiographic evidence of acute cardiopulmonary abnormality. Electronically Signed   By: Ted Mcalpine M.D.   On: 06/18/2015 17:38    ____________________________________________   PROCEDURES  Procedure(s) performed: None  Critical Care performed: Yes, see critical care note(s)   CRITICAL CARE Performed by: Loleta Rose   Total critical care time: 30 minutes  Critical care time was exclusive of separately billable procedures and treating other patients.  Critical care was necessary to treat or prevent imminent or life-threatening deterioration.  Critical care was time spent personally by me on the following  activities: development of treatment plan with patient and/or surrogate as well as nursing, discussions with consultants, evaluation of patient's response to treatment, examination of patient, obtaining history from patient or surrogate, ordering and performing treatments and interventions, ordering and review of laboratory studies, ordering and review of radiographic studies, pulse oximetry and re-evaluation of patient's condition.  ____________________________________________   INITIAL IMPRESSION / ASSESSMENT AND PLAN / ED COURSE  Pertinent labs & imaging results that were available during my care of the patient were reviewed by me and considered in my medical decision making (see chart for details).  The patient's signs, symptoms, and history of present illness all suggests peripheral vertigo.  There is nothing in his current presentation that makes me concerned about a central cause (CVA).  I will do a chest pain workup given his significant history and treated with meclizine.  If necessary I will obtain a head CT but I feel that this be of little value at this time.  The patient appears asymptomatic currently is on is he is not change in position.  I will reassess.  (Note that documentation was delayed due to multiple ED patients requiring immediate care.)   The patient's vertigo is slightly better.  However, his troponin is 0.21.  The last time he came in with an elevated troponin of 0.2, the second one was 20 and he was catheterized.  He has known severe disease to his RCA that is not amenable to catheter.  I feel that this child and has too many risk factors and is at far too high a risk of having another MI to discharge.  I will admit to the hospital for an NSTEMI.  I have given him aspirin, heparin bolus, and heparin drip for NSTEMI.  ____________________________________________  FINAL CLINICAL IMPRESSION(S) / ED DIAGNOSES  Final diagnoses:  NSTEMI (non-ST elevated myocardial  infarction) (HCC)  Vertigo      NEW MEDICATIONS STARTED DURING THIS VISIT:  Current Discharge Medication List       Loleta Rose, MD 06/19/15 0004

## 2015-06-19 LAB — CBC
HEMATOCRIT: 32.7 % — AB (ref 40.0–52.0)
Hemoglobin: 10.8 g/dL — ABNORMAL LOW (ref 13.0–18.0)
MCH: 25.5 pg — ABNORMAL LOW (ref 26.0–34.0)
MCHC: 32.9 g/dL (ref 32.0–36.0)
MCV: 77.4 fL — ABNORMAL LOW (ref 80.0–100.0)
PLATELETS: 173 10*3/uL (ref 150–440)
RBC: 4.22 MIL/uL — ABNORMAL LOW (ref 4.40–5.90)
RDW: 16.7 % — AB (ref 11.5–14.5)
WBC: 7.3 10*3/uL (ref 3.8–10.6)

## 2015-06-19 LAB — HEPARIN LEVEL (UNFRACTIONATED): Heparin Unfractionated: <0.1 IU/mL — ABNORMAL LOW (ref 0.30–0.70)

## 2015-06-19 LAB — TROPONIN I: TROPONIN I: 0.21 ng/mL — AB (ref <0.031)

## 2015-06-19 MED ORDER — RANOLAZINE ER 500 MG PO TB12
500.0000 mg | ORAL_TABLET | Freq: Two times a day (BID) | ORAL | Status: DC
  Administered 2015-06-19 – 2015-06-21 (×5): 500 mg via ORAL
  Filled 2015-06-19 (×5): qty 1

## 2015-06-19 MED ORDER — HEPARIN BOLUS VIA INFUSION
3000.0000 [IU] | Freq: Once | INTRAVENOUS | Status: AC
  Administered 2015-06-19: 3000 [IU] via INTRAVENOUS
  Filled 2015-06-19: qty 3000

## 2015-06-19 MED ORDER — ATORVASTATIN CALCIUM 20 MG PO TABS
40.0000 mg | ORAL_TABLET | Freq: Every evening | ORAL | Status: DC
  Administered 2015-06-19 – 2015-06-20 (×2): 40 mg via ORAL
  Filled 2015-06-19 (×2): qty 2

## 2015-06-19 MED ORDER — SODIUM CHLORIDE 0.9 % IV SOLN
INTRAVENOUS | Status: DC
  Administered 2015-06-19: 14:00:00 via INTRAVENOUS

## 2015-06-19 MED ORDER — METOPROLOL TARTRATE 25 MG PO TABS
25.0000 mg | ORAL_TABLET | Freq: Two times a day (BID) | ORAL | Status: DC
  Administered 2015-06-19 – 2015-06-20 (×3): 25 mg via ORAL
  Filled 2015-06-19 (×4): qty 1

## 2015-06-19 NOTE — Care Management Note (Signed)
Case Management Note  Patient Details  Name: KADEEM MCKISSIC MRN: [id number] Date of Birth: March 02, 1951  Subjective/Objective:              Patient admitted with chest pain  and elevated troponin.  he has previous cardiac stenting and CABG.  Heart rate down in the 40's.  Cardiology decreased metoprolol dose and added Renexa.  Patient denies issues obtaining medications, accessing medical care and issues with transportation       Action/Plan:   Expected Discharge Date:                  Expected Discharge Plan:   Home - self care  In-House Referral:     Discharge planning Services     Post Acute Care Choice:    Choice offered to:     DME Arranged:    DME Agency:     HH Arranged:    HH Agency:     Status of Service:     Medicare Important Message Given:    Date Medicare IM Given:    Medicare IM give by:    Date Additional Medicare IM Given:    Additional Medicare Important Message give by:     If discussed at Long Length of Stay Meetings, dates discussed:    Additional Comments:  Eber Hong, RN 06/19/2015, 4:39 PM

## 2015-06-19 NOTE — Consult Note (Signed)
[facility]  CARDIOLOGY CONSULT NOTE  Patient ID: TORRELL KRUTZ MRN: [id number] DOB/AGE: 1950-10-17 64 y.o.  Admit date: 06/18/2015 Referring Physician Clent Ridges Primary Physician Sullivan Lone Primary Cardiologist fath Reason for Consultation chest pain and dizziness  HPI: The patient is a 64 year old gentleman with known history of coronary artery disease referred for evaluation of chest pain and dizziness. The patient is status post CABG x 5, and coronary stent of SVG 07/2014. The patient has had a history of intermittent episodes of chest discomfort, followed by Dr. Lady Gary on metoprolol and Imdur. Recently, metoprolol was increased from 25-50 mg twice a day. Yesterday, the patient started to experience episodes of dizziness, when he sat up, relieved after laying back down. The patient was alarmed, and experienced chest pain. He presented to [facility] emergency room where EKG was nondiagnostic. Initial troponin was 0.21. Follow-up troponins were all 0.21. The patient does have chronic kidney disease. On telemetry, the patient did have episodes of bradycardia in the 40s. The patient does have a history of sleep apnea on CPAP.  Review of systems complete and found to be negative unless listed above     Past Medical History  Diagnosis Date  . Hypertension     Past Surgical History  Procedure Laterality Date  . Cardiac catheterization    . Back surgery    . Replacement total knee Left   . Coronary artery bypass graft    . Wrist surgery Right     Prescriptions prior to admission  Medication Sig Dispense Refill Last Dose  . allopurinol (ZYLOPRIM) 100 MG tablet Take 1 tablet (100 mg total) by mouth daily. 30 tablet 12 06/18/2015 at pm  . aspirin EC 81 MG tablet Take 81 mg by mouth daily.   06/18/2015 at pm  . atorvastatin (LIPITOR) 20 MG tablet Take 20 mg by mouth every evening.    06/17/2015 at pm  . clopidogrel (PLAVIX) 75 MG tablet Take 75 mg by mouth daily.    06/18/2015 at pm  . esomeprazole  (NEXIUM) 40 MG capsule Take 1 capsule by mouth daily as needed (for acid reflux).    PRN  . fenofibrate 160 MG tablet Take 1 tablet (160 mg total) by mouth daily. (Patient taking differently: Take 160 mg by mouth every evening. ) 30 tablet 12 06/17/2015 at pm  . glucose blood (FREESTYLE TEST STRIPS) test strip Check sugar once daily 100 each 12 Taking  . HYDROcodone-acetaminophen (NORCO/VICODIN) 5-325 MG per tablet Take 1 tablet by mouth every 6 (six) hours as needed for moderate pain or severe pain.    PRN  . isosorbide mononitrate (IMDUR) 60 MG 24 hr tablet Take 60 mg by mouth 2 (two) times daily.  5 06/18/2015 at pm  . metoprolol succinate (TOPROL-XL) 50 MG 24 hr tablet Take 50 mg by mouth 2 (two) times daily.   06/18/2015 at 1600  . nitroGLYCERIN (NITROSTAT) 0.4 MG SL tablet Place 0.4 mg under the tongue every 5 (five) minutes as needed for chest pain.    PRN  . valsartan-hydrochlorothiazide (DIOVAN-HCT) 160-25 MG per tablet Take 1 tablet by mouth daily.    06/18/2015 at pm  . pioglitazone (ACTOS) 15 MG tablet Take 1 tablet (15 mg total) by mouth daily. (Patient not taking: Reported on 06/18/2015) 30 tablet 12 Taking   Social History   Social History  . Marital Status: Divorced    Spouse Name: divorced  . Number of Children: 1  . Years of Education: 14   Occupational History  .  retired    Social History Main Topics  . Smoking status: Never Smoker   . Smokeless tobacco: Never Used  . Alcohol Use: No  . Drug Use: No  . Sexual Activity: No   Other Topics Concern  . Not on file   Social History Narrative    Family History  Problem Relation Age of Onset  . Liver cancer Mother   . CAD Mother   . Arthritis Mother   . Heart attack Father   . CVA Father   . CAD Father   . Dementia Sister       Review of systems complete and found to be negative unless listed above      PHYSICAL EXAM  General: Well developed, well nourished, in no acute distress HEENT:  Normocephalic  and atramatic Neck:  No JVD.  Lungs: Clear bilaterally to auscultation and percussion. Heart: HRRR . Normal S1 and S2 without gallops or murmurs.  Abdomen: Bowel sounds are positive, abdomen soft and non-tender  Msk:  Back normal, normal gait. Normal strength and tone for age. Extremities: No clubbing, cyanosis or edema.   Neuro: Alert and oriented X 3. Psych:  Good affect, responds appropriately  Labs:   Lab Results  Component Value Date   WBC 7.3 06/19/2015   HGB 10.8* 06/19/2015   HCT 32.7* 06/19/2015   MCV 77.4* 06/19/2015   PLT 173 06/19/2015    Recent Labs Lab 06/18/15 1748  NA 139  K 4.5  CL 110  CO2 23  BUN 32*  CREATININE 1.77*  CALCIUM 9.1  PROT 7.4  BILITOT 0.7  ALKPHOS 33*  ALT 24  AST 24  GLUCOSE 138*   Lab Results  Component Value Date   CKTOTAL 220 08/09/2007   CKMB 4.9* 08/09/2007   TROPONINI 0.21* 06/19/2015    Lab Results  Component Value Date   CHOL 115 06/08/2015   CHOL 97 11/08/2013   CHOL 115 10/10/2013   Lab Results  Component Value Date   HDL 18* 06/08/2015   HDL 22* 11/08/2013   HDL 15* 10/10/2013   Lab Results  Component Value Date   LDLCALC 63 06/08/2015   LDLCALC 48 11/08/2013   LDLCALC 45 10/10/2013   Lab Results  Component Value Date   TRIG 168* 06/08/2015   TRIG 133 11/08/2013   TRIG 275* 10/10/2013   No results found for: CHOLHDL No results found for: LDLDIRECT    Radiology: Dg Chest 2 View  06/18/2015  CLINICAL DATA:  Chronic chest pain. Dizziness and difficulty standing. EXAM: CHEST  2 VIEW COMPARISON:  CT of the chest 09/16/2014 FINDINGS: Postsurgical changes from CABG are stable. Cardiomediastinal silhouette is normal. Mediastinal contours appear intact. There is no evidence of focal airspace consolidation, pleural effusion or pneumothorax. Osseous structures are without acute abnormality. Soft tissues are grossly normal. IMPRESSION: No radiographic evidence of acute cardiopulmonary abnormality. Electronically  Signed   By: Ted Mcalpine M.D.   On: 06/18/2015 17:38    EKG: Normal sinus rhythm  ASSESSMENT AND PLAN:   1. Postural dizziness, possibly exacerbated by recent increase in metoprolol, with episodes of bradycardia on telemetry 2. Recurrent chest pain, with elevated troponin, without peak or trough, unlikely due to acute coronary syndrome 3. Known CAD, status post CABG, status post stent status post bypass graft  Recommendations  1. DC heparin 2. Decrease metoprolol 25 mg twice a day 3. Add Ranexa 500 mg twice a day 4. Review previous cardiac catheterization. Patient reports during his last  cardiac catheterization it was felt that no further stenting could  be done. He is somewhat reluctant to undergo repeat cardiac catheterization at this time.   SignedMarcina Millard MD,PhD, [facility] 06/19/2015, 9:00 AM

## 2015-06-19 NOTE — Progress Notes (Signed)
[facility] - [place] [facility]   PATIENT NAME: Joshua Larsen    MR#:  [id number]  DATE OF BIRTH:  Jun 07, 1951  SUBJECTIVE:  CHIEF COMPLAINT:   Chief Complaint  Patient presents with  . Chest Pain   Still feeling very dizzy whenever attempting to stand or sit up. States that the room is spinning. Has to hold onto things to keep from falling. Despite these symptoms he wishes to go home.  REVIEW OF SYSTEMS:   Review of Systems  Constitutional: Negative for fever.  Respiratory: Negative for shortness of breath.   Cardiovascular: Positive for chest pain. Negative for palpitations.  Gastrointestinal: Negative for nausea, vomiting and abdominal pain.  Genitourinary: Negative for dysuria.  Neurological: Positive for dizziness.    DRUG ALLERGIES:   Allergies  Allergen Reactions  . Amoxicillin   . Codeine     GI side effects  . Tagamet  [Cimetidine] Other (See Comments)    Causes changes in mental status Causes changes in mental status    VITALS:  Blood pressure 111/54, pulse 47, temperature 97.7 F (36.5 C), temperature source Oral, resp. rate 16, height  (1.753 m), weight 140.842 kg (310 lb 8 oz), SpO2 100 %.  PHYSICAL EXAMINATION:  GENERAL:  64 y.o.-year-old patient lying in the bed with no acute distress. Obese LUNGS: Normal breath sounds bilaterally, no wheezing, rales,rhonchi or crepitation. No use of accessory muscles of respiration.  CARDIOVASCULAR: S1, S2 normal. No murmurs, rubs, or gallops.  ABDOMEN: Soft, nontender, nondistended. Bowel sounds present. No organomegaly or mass.  EXTREMITIES: No pedal edema, cyanosis, or clubbing. Pulses 2+ NEUROLOGIC: Cranial nerves II through XII are intact. Muscle strength 5/5 in all extremities. Sensation intact. Gait not checked.  PSYCHIATRIC: The patient is alert and oriented x 3.  SKIN: No obvious rash, lesion, or ulcer.    LABORATORY PANEL:   CBC  Recent Labs Lab 06/19/15 0428  WBC  7.3  HGB 10.8*  HCT 32.7*  PLT 173   ------------------------------------------------------------------------------------------------------------------  Chemistries   Recent Labs Lab 06/18/15 1748  NA 139  K 4.5  CL 110  CO2 23  GLUCOSE 138*  BUN 32*  CREATININE 1.77*  CALCIUM 9.1  AST 24  ALT 24  ALKPHOS 33*  BILITOT 0.7   ------------------------------------------------------------------------------------------------------------------  Cardiac Enzymes  Recent Labs Lab 06/19/15 0428  TROPONINI 0.21*   ------------------------------------------------------------------------------------------------------------------  RADIOLOGY:  Dg Chest 2 View  06/18/2015  CLINICAL DATA:  Chronic chest pain. Dizziness and difficulty standing. EXAM: CHEST  2 VIEW COMPARISON:  CT of the chest 09/16/2014 FINDINGS: Postsurgical changes from CABG are stable. Cardiomediastinal silhouette is normal. Mediastinal contours appear intact. There is no evidence of focal airspace consolidation, pleural effusion or pneumothorax. Osseous structures are without acute abnormality. Soft tissues are grossly normal. IMPRESSION: No radiographic evidence of acute cardiopulmonary abnormality. Electronically Signed   By: Ted Mcalpine M.D.   On: 06/18/2015 17:38    EKG:   Orders placed or performed during the hospital encounter of 06/18/15  . EKG 12-Lead  . EKG 12-Lead  . EKG 12-Lead  . EKG 12-Lead  . ED EKG within 10 minutes  . ED EKG within 10 minutes    ASSESSMENT AND PLAN:   #1 dizziness: Likely due to recent increase in metoprolol dose and resultant bradycardia. Dr. precious is seen the patient. His dose of metoprolol has been decreased. His heart rate is trending upwards. He still becomes acutely dizzy when trying to stand and is at high  risk for falls. He lives alone. He would like to go home but he has been advised to stay at least overnight. We'll have physical therapy evaluate. We'll get  orthostatics in the morning. Heparin discontinued. Continue Plavix aspirin and statin. Decrease metoprolol dose. Continue Imdur and when necessary nitroglycerin.  #2 elevated troponin: There has been no change in his elevated troponin level. He has been seen by cardiology. He has known cardiac disease with chronic angina. This is likely not an acute event. Ranexa ordered  #3 hypertension: Actually slightly hypotensive at this time. Hold ARB and HCTZ for now.  #4 diabetes mellitus type 2: A1c is 6.9 recently. Not on hypoglycemic agents. No SSI needed  #5 CK D stage III: Stable unchanged  #6 obstructive sleep apnea: Continue CPAP   CODE STATUS: Full TOTAL TIME TAKING CARE OF THIS PATIENT: 25 minutes.  Greater than 50% of time spent in care coordination and counseling. Care plan discussed with the patient and also with Dr. Ann Maki shows POSSIBLE D/C IN 1 DAYS, DEPENDING ON CLINICAL CONDITION.   Elby Showers M.D on 06/19/2015 at 2:16 PM  Between 7am to 6pm - Pager - [phone]  After 6pm go to [email or web address] - password [email or web address] [facility]  [place] [facility]  Office  [phone]  CC: Primary care physician; Megan Mans, MD

## 2015-06-19 NOTE — Progress Notes (Signed)
ANTICOAGULATION CONSULT NOTE - Initial Consult  Pharmacy Consult for Heparin Indication: chest pain/ACS  Allergies  Allergen Reactions  . Amoxicillin   . Codeine     GI side effects  . Tagamet  [Cimetidine] Other (See Comments)    Causes changes in mental status Causes changes in mental status    Patient Measurements: Height: 5\' 9"  (175.3 cm) Weight: (!) 310 lb 8 oz (140.842 kg) IBW/kg (Calculated) : 70.7 Heparin Dosing Weight: 103.8 kg   Vital Signs: Temp: 98 F (36.7 C) (10/28 0447) Temp Source: Oral (10/27 2033) BP: 103/65 mmHg (10/28 0447) Pulse Rate: 56 (10/28 0447)  Labs:  Recent Labs  06/18/15 1748 06/18/15 2039 06/19/15 0428  HGB 11.5*  --  10.8*  HCT 35.5*  --  32.7*  PLT 179  --  173  APTT 34  --   --   LABPROT 14.5  --   --   INR 1.11  --   --   HEPARINUNFRC  --   --  <0.10*  CREATININE 1.77*  --   --   TROPONINI 0.21* 0.23* 0.21*    Estimated Creatinine Clearance: 58.9 mL/min (by C-G formula based on Cr of 1.77).   Medical History: Past Medical History  Diagnosis Date  . Hypertension     Medications:  Prescriptions prior to admission  Medication Sig Dispense Refill Last Dose  . allopurinol (ZYLOPRIM) 100 MG tablet Take 1 tablet (100 mg total) by mouth daily. 30 tablet 12 06/18/2015 at pm  . aspirin EC 81 MG tablet Take 81 mg by mouth daily.   06/18/2015 at pm  . atorvastatin (LIPITOR) 20 MG tablet Take 20 mg by mouth every evening.    06/17/2015 at pm  . clopidogrel (PLAVIX) 75 MG tablet Take 75 mg by mouth daily.    06/18/2015 at pm  . esomeprazole (NEXIUM) 40 MG capsule Take 1 capsule by mouth daily as needed (for acid reflux).    PRN  . fenofibrate 160 MG tablet Take 1 tablet (160 mg total) by mouth daily. (Patient taking differently: Take 160 mg by mouth every evening. ) 30 tablet 12 06/17/2015 at pm  . glucose blood (FREESTYLE TEST STRIPS) test strip Check sugar once daily 100 each 12 Taking  . HYDROcodone-acetaminophen (NORCO/VICODIN)  5-325 MG per tablet Take 1 tablet by mouth every 6 (six) hours as needed for moderate pain or severe pain.    PRN  . isosorbide mononitrate (IMDUR) 60 MG 24 hr tablet Take 60 mg by mouth 2 (two) times daily.  5 06/18/2015 at pm  . metoprolol succinate (TOPROL-XL) 50 MG 24 hr tablet Take 50 mg by mouth 2 (two) times daily.   06/18/2015 at 1600  . nitroGLYCERIN (NITROSTAT) 0.4 MG SL tablet Place 0.4 mg under the tongue every 5 (five) minutes as needed for chest pain.    PRN  . valsartan-hydrochlorothiazide (DIOVAN-HCT) 160-25 MG per tablet Take 1 tablet by mouth daily.    06/18/2015 at pm  . pioglitazone (ACTOS) 15 MG tablet Take 1 tablet (15 mg total) by mouth daily. (Patient not taking: Reported on 06/18/2015) 30 tablet 12 Taking    Assessment: Pharmacy consulted to dose heparin in this 64 year old male admitted with ACS. CrCl = 58.6 ml/min  Goal of Therapy:  Heparin level 0.3-0.7 units/ml Monitor platelets by anticoagulation protocol: Yes   Plan:  AM heparin level subtherapeutic, confirmed with RN that infusion is running and line is clear. 3000 unit IV bolus x 1 and  increase rate to 1750 units/hr. Will recheck in 6 hours.  Carola Frost, Pharm.D.  Clinical Pharmacist 06/19/2015,6:38 AM

## 2015-06-20 DIAGNOSIS — E1122 Type 2 diabetes mellitus with diabetic chronic kidney disease: Secondary | ICD-10-CM

## 2015-06-20 DIAGNOSIS — G4733 Obstructive sleep apnea (adult) (pediatric): Secondary | ICD-10-CM

## 2015-06-20 DIAGNOSIS — N183 Chronic kidney disease, stage 3 unspecified: Secondary | ICD-10-CM

## 2015-06-20 DIAGNOSIS — I10 Essential (primary) hypertension: Secondary | ICD-10-CM

## 2015-06-20 DIAGNOSIS — R001 Bradycardia, unspecified: Secondary | ICD-10-CM

## 2015-06-20 DIAGNOSIS — R42 Dizziness and giddiness: Secondary | ICD-10-CM

## 2015-06-20 DIAGNOSIS — E119 Type 2 diabetes mellitus without complications: Secondary | ICD-10-CM

## 2015-06-20 LAB — BASIC METABOLIC PANEL
ANION GAP: 8 (ref 5–15)
BUN: 30 mg/dL — ABNORMAL HIGH (ref 6–20)
CHLORIDE: 109 mmol/L (ref 101–111)
CO2: 25 mmol/L (ref 22–32)
Calcium: 8.9 mg/dL (ref 8.9–10.3)
Creatinine, Ser: 1.66 mg/dL — ABNORMAL HIGH (ref 0.61–1.24)
GFR calc non Af Amer: 42 mL/min — ABNORMAL LOW (ref >60)
GFR, EST AFRICAN AMERICAN: 49 mL/min — AB (ref >60)
Glucose, Bld: 121 mg/dL — ABNORMAL HIGH (ref 65–99)
POTASSIUM: 4.4 mmol/L (ref 3.5–5.1)
SODIUM: 142 mmol/L (ref 135–145)

## 2015-06-20 LAB — CBC
HCT: 32.6 % — ABNORMAL LOW (ref 40.0–52.0)
HEMOGLOBIN: 10.7 g/dL — AB (ref 13.0–18.0)
MCH: 25.1 pg — ABNORMAL LOW (ref 26.0–34.0)
MCHC: 32.7 g/dL (ref 32.0–36.0)
MCV: 76.7 fL — ABNORMAL LOW (ref 80.0–100.0)
Platelets: 177 10*3/uL (ref 150–440)
RBC: 4.25 MIL/uL — AB (ref 4.40–5.90)
RDW: 17.3 % — ABNORMAL HIGH (ref 11.5–14.5)
WBC: 6.9 10*3/uL (ref 3.8–10.6)

## 2015-06-20 MED ORDER — MECLIZINE HCL 25 MG PO TABS: 25.0000 mg | ORAL_TABLET | Freq: Three times a day (TID) | ORAL | Status: DC

## 2015-06-20 MED ORDER — MECLIZINE HCL 25 MG PO TABS
25.0000 mg | ORAL_TABLET | Freq: Three times a day (TID) | ORAL | Status: DC
  Administered 2015-06-20 – 2015-06-21 (×4): 25 mg via ORAL
  Filled 2015-06-20 (×4): qty 1

## 2015-06-20 MED ORDER — RANOLAZINE ER 500 MG PO TB12: 500.0000 mg | ORAL_TABLET | Freq: Two times a day (BID) | ORAL | Status: DC

## 2015-06-20 MED ORDER — METOPROLOL TARTRATE 25 MG PO TABS: 25.0000 mg | ORAL_TABLET | Freq: Two times a day (BID) | ORAL | Status: DC

## 2015-06-20 NOTE — Plan of Care (Signed)
Problem: Phase II Progression Outcomes Goal: If positive for MI, change to MI Path Outcome: Not Applicable Date Met:  94/71/25 Pt is alert and oriented x 4, up in room with stand by assist, on room air, denies pain, negative for orthostatics, started on meclizine with improvement in vertigo, fair appetite, awaiting MRII to r/u stroke, uneventful shift.

## 2015-06-20 NOTE — Progress Notes (Signed)
Physical Therapy Evaluation Patient Details Name: Joshua Larsen MRN: [id number] DOB: [birth year] Today's Date: 06/20/2015   History of Present Illness  Patient is a 64 y.o. male who presented to ED on 27 Oct. following chest pains and feelings of dizziness when moving from supine to sit and sit to supine. Patient has hx of HTN, CAD, MI s/p stent, DM, and CKD stage 3.  Clinical Impression  Patient demonstrates baseline level of function upon evaluation, so no further PT is needed at this time. Does note slight pain in L posterior scapulae following ambulation around nurses station. Did not experience described dizziness that he experienced prior to coming to ED. Admits that the evening prior to experiencing dizziness he had taken medication for joint pain, which he admittedly hadn't taken in a while. States that he feels like he can't exercise because when he does he experiences increased chest pain. PT discussed expectations upon discharge with patient, and he seemed open to cardiac rehabilitation.    Follow Up Recommendations Other (comment) (Cardiac rehabilitation)    Equipment Recommendations  None recommended by PT    Recommendations for Other Services Other (comment)     Precautions / Restrictions Precautions Precautions: None Restrictions Weight Bearing Restrictions: No      Mobility  Bed Mobility Overal bed mobility: Independent                Transfers Overall transfer level: Independent                  Ambulation/Gait Ambulation/Gait assistance: Independent Ambulation Distance (Feet): 360 Feet   Gait Pattern/deviations: WFL(Within Functional Limits)     General Gait Details: Patient ambulates independently with HR ranging from 85 bpm-95 bpm. Feelings of slight discomfort in posterior L scapulae following activity.  Stairs            Wheelchair Mobility    Modified Rankin (Stroke Patients Only)       Balance Overall balance  assessment: Independent                                           Pertinent Vitals/Pain Pain Assessment: No/denies pain    Home Living Family/patient expects to be discharged to:: Private residence Living Arrangements: Alone   Type of Home: House Home Access: Stairs to enter Entrance Stairs-Rails: Can reach both Entrance Stairs-Number of Steps: 4 Home Layout: One level Home Equipment: None      Prior Function Level of Independence: Independent               Hand Dominance        Extremity/Trunk Assessment   Upper Extremity Assessment: Overall WFL for tasks assessed           Lower Extremity Assessment: Overall WFL for tasks assessed         Communication   Communication: No difficulties  Cognition Arousal/Alertness: Awake/alert Behavior During Therapy: WFL for tasks assessed/performed Overall Cognitive Status: Within Functional Limits for tasks assessed                      General Comments      Exercises        Assessment/Plan    PT Assessment Patent does not need any further PT services  PT Diagnosis Generalized weakness   PT Problem List    PT Treatment Interventions  PT Goals (Current goals can be found in the Care Plan section) Acute Rehab PT Goals Patient Stated Goal: "To go home" PT Goal Formulation: With patient Time For Goal Achievement: 07/04/15 Potential to Achieve Goals: Good    Frequency     Barriers to discharge        Co-evaluation               End of Session Equipment Utilized During Treatment: Gait belt Activity Tolerance: Patient tolerated treatment well Patient left: in bed;with call bell/phone within reach           Time: 1200-1220 PT Time Calculation (min) (ACUTE ONLY): 20 min   Charges:   PT Evaluation $Initial PT Evaluation Tier I: 1 Procedure     PT G Codes:        Neita Carp, PT, DPT 06/20/2015, 12:39 PM

## 2015-06-20 NOTE — Progress Notes (Signed)
Paged Dr Winona Legato to notify MD that MRI has not been done at this time, awaiting for call back.

## 2015-06-20 NOTE — Progress Notes (Signed)
[facility]  SUBJECTIVE: I'm feeling better, with less chest pain and dizziness   Filed Vitals:   06/20/15 0602 06/20/15 0605 06/20/15 0607 06/20/15 1206  BP: 112/55 122/61 137/59 120/63  Pulse: 60 59 67 67  Temp:  98.6 F (37 C)  98.1 F (36.7 C)  TempSrc:    Oral  Resp: 19 20 18 20   Height:      Weight:      SpO2: 100% 100% 100% 96%     Intake/Output Summary (Last 24 hours) at 06/20/15 1213 Last data filed at 06/20/15 0947  Gross per 24 hour  Intake    360 ml  Output   1600 ml  Net  -1240 ml      PHYSICAL EXAM  General: Well developed, well nourished, in no acute distress HEENT:  Normocephalic and atramatic Neck:  No JVD.  Lungs: Clear bilaterally to auscultation and percussion. Heart: HRRR . Normal S1 and S2 without gallops or murmurs.  Abdomen: Bowel sounds are positive, abdomen soft and non-tender  Msk:  Back normal, normal gait. Normal strength and tone for age. Extremities: No clubbing, cyanosis or edema.   Neuro: Alert and oriented X 3. Psych:  Good affect, responds appropriately   LABS: Basic Metabolic Panel:  Recent Labs  35/68/61 1748 06/20/15 0500  NA 139 142  K 4.5 4.4  CL 110 109  CO2 23 25  GLUCOSE 138* 121*  BUN 32* 30*  CREATININE 1.77* 1.66*  CALCIUM 9.1 8.9   Liver Function Tests:  Recent Labs  06/18/15 1748  AST 24  ALT 24  ALKPHOS 33*  BILITOT 0.7  PROT 7.4  ALBUMIN 4.0    Recent Labs  06/18/15 1748  LIPASE 40   CBC:  Recent Labs  06/19/15 0428 06/20/15 0500  WBC 7.3 6.9  HGB 10.8* 10.7*  HCT 32.7* 32.6*  MCV 77.4* 76.7*  PLT 173 177   Cardiac Enzymes:  Recent Labs  06/18/15 1748 06/18/15 2039 06/19/15 0428  TROPONINI 0.21* 0.23* 0.21*   BNP: Invalid input(s): POCBNP D-Dimer: No results for input(s): DDIMER in the last 72 hours. Hemoglobin A1C: No results for input(s): HGBA1C in the last 72 hours. Fasting Lipid Panel: No results for input(s): CHOL, HDL, LDLCALC, TRIG, CHOLHDL, LDLDIRECT in  the last 72 hours. Thyroid Function Tests: No results for input(s): TSH, T4TOTAL, T3FREE, THYROIDAB in the last 72 hours.  Invalid input(s): FREET3 Anemia Panel: No results for input(s): VITAMINB12, FOLATE, FERRITIN, TIBC, IRON, RETICCTPCT in the last 72 hours.  Dg Chest 2 View  06/18/2015  CLINICAL DATA:  Chronic chest pain. Dizziness and difficulty standing. EXAM: CHEST  2 VIEW COMPARISON:  CT of the chest 09/16/2014 FINDINGS: Postsurgical changes from CABG are stable. Cardiomediastinal silhouette is normal. Mediastinal contours appear intact. There is no evidence of focal airspace consolidation, pleural effusion or pneumothorax. Osseous structures are without acute abnormality. Soft tissues are grossly normal. IMPRESSION: No radiographic evidence of acute cardiopulmonary abnormality. Electronically Signed   By: Ted Mcalpine M.D.   On: 06/18/2015 17:38     Echo   TELEMETRY: Sinus rhythm:  ASSESSMENT AND PLAN:  Principal Problem:   Elevated troponin Active Problems:   Dizziness and giddiness   Symptomatic sinus bradycardia   HTN (hypertension)   Diabetes (HCC)   CKD stage 3 secondary to diabetes (HCC)   OSA (obstructive sleep apnea)    1. Postural dizziness, possibly related to recent increase in metoprolol dose, no correlation with telemetry findings, improved since admission 2.  Recurrent chest pain, with elevated troponin, without peak or trough, unlikely due to acute coronary syndrome, appears improved 3. Known CAD, status post CABG 5, status post stent bypass graft, was told during the last cardiac catheterization net no further stenting could be done, patient reluctant to undergo repeat cardiac catheterization at this time  Recommendations  1. Continue current therapy 2. Defer full dose anticoagulation 3. Continue Ranexa 500 mg twice a day 4. Await MRI results for postural dizziness 5. Defer cardiac catheterization at this time     Marcina Millard, MD,  PhD, [facility] 06/20/2015 12:13 PM

## 2015-06-20 NOTE — Progress Notes (Signed)
[facility] - [place] at [facility]   PATIENT NAME: Joshua Larsen    MR#:  [id number]  DATE OF BIRTH:  12-16-1950  SUBJECTIVE:  CHIEF COMPLAINT:   Chief Complaint  Patient presents with  . Chest Pain   Still feeling dizzy whenever attempting to stand or sit up. States that the room is spinning. Has to hold onto things to keep from falling. Feels somewhat better since meclizine was initiated. Would like to have MRI of brain done to rule out stroke, although denies any nausea, vomiting with dizziness episodes, but still feels somewhat unsteady on his feet. Episodes are mostly related to him sitting up or laying down.   REVIEW OF SYSTEMS:   Review of Systems  Constitutional: Negative for fever.  Respiratory: Negative for shortness of breath.   Cardiovascular: Positive for chest pain. Negative for palpitations.  Gastrointestinal: Negative for nausea, vomiting and abdominal pain.  Genitourinary: Negative for dysuria.  Neurological: Positive for dizziness.    DRUG ALLERGIES:   Allergies  Allergen Reactions  . Amoxicillin   . Codeine     GI side effects  . Tagamet  [Cimetidine] Other (See Comments)    Causes changes in mental status Causes changes in mental status    VITALS:  Blood pressure 120/63, pulse 67, temperature 98.1 F (36.7 C), temperature source Oral, resp. rate 20, height  (1.753 m), weight 140.66 kg (310 lb 1.6 oz), SpO2 96 %.  PHYSICAL EXAMINATION:  GENERAL:  64 y.o.-year-old patient lying in the bed with no acute distress. Obese LUNGS: Normal breath sounds bilaterally, no wheezing, rales,rhonchi or crepitation. No use of accessory muscles of respiration.  CARDIOVASCULAR: S1, S2 normal. No murmurs, rubs, or gallops.  ABDOMEN: Soft, nontender, nondistended. Bowel sounds present. No organomegaly or mass.  EXTREMITIES: No pedal edema, cyanosis, or clubbing. Pulses 2+ NEUROLOGIC: Cranial nerves II through XII are intact. Muscle  strength 5/5 in all extremities. Sensation intact. Gait not checked.  PSYCHIATRIC: The patient is alert and oriented x 3.  SKIN: No obvious rash, lesion, or ulcer.    LABORATORY PANEL:   CBC  Recent Labs Lab 06/20/15 0500  WBC 6.9  HGB 10.7*  HCT 32.6*  PLT 177   ------------------------------------------------------------------------------------------------------------------  Chemistries   Recent Labs Lab 06/18/15 1748 06/20/15 0500  NA 139 142  K 4.5 4.4  CL 110 109  CO2 23 25  GLUCOSE 138* 121*  BUN 32* 30*  CREATININE 1.77* 1.66*  CALCIUM 9.1 8.9  AST 24  --   ALT 24  --   ALKPHOS 33*  --   BILITOT 0.7  --    ------------------------------------------------------------------------------------------------------------------  Cardiac Enzymes  Recent Labs Lab 06/19/15 0428  TROPONINI 0.21*   ------------------------------------------------------------------------------------------------------------------  RADIOLOGY:  Dg Chest 2 View  06/18/2015  CLINICAL DATA:  Chronic chest pain. Dizziness and difficulty standing. EXAM: CHEST  2 VIEW COMPARISON:  CT of the chest 09/16/2014 FINDINGS: Postsurgical changes from CABG are stable. Cardiomediastinal silhouette is normal. Mediastinal contours appear intact. There is no evidence of focal airspace consolidation, pleural effusion or pneumothorax. Osseous structures are without acute abnormality. Soft tissues are grossly normal. IMPRESSION: No radiographic evidence of acute cardiopulmonary abnormality. Electronically Signed   By: Ted Mcalpine M.D.   On: 06/18/2015 17:38    EKG:   Orders placed or performed during the hospital encounter of 06/18/15  . EKG 12-Lead  . EKG 12-Lead  . EKG 12-Lead  . EKG 12-Lead  . ED EKG within 10 minutes  .  ED EKG within 10 minutes    ASSESSMENT AND PLAN:   #1 . Postural dizziness: Unlikely due to recent increase in metoprolol dose and resultant bradycardia. Patient will  benefit from outpatient vestibular physical therapy, continue meclizine for now and get MRI of brain to rule out stroke. Orthostatic vital signs were normal  #2 elevated troponin: There has been no change in his elevated troponin level. He has been seen by cardiology, not felt to be due to cardiac event. He has known cardiac disease with chronic angina. This is likely not an acute event. Ranexa recommended as outpatient  #3 hypertension: Actually slightly hypotensive at this time. Hold ARB and HCTZ for now. Resume as outpatient  #4 diabetes mellitus type 2: A1c is 6.9 recently. Not on hypoglycemic agents. No SSI needed  #5 CK D stage III: Stable unchanged  #6 obstructive sleep apnea: Continue CPAP   CODE STATUS: Full TOTAL TIME TAKING CARE OF THIS PATIENT: 40 minutes.  Coordination of care time 15 minutes    Jenna Routzahn M.D on 06/20/2015 at 12:40 PM  Between 7am to 6pm - Pager - [phone]  After 6pm go to [email or web address] - password [email or web address] [facility]  [place]  Office  [phone]  CC: Primary care physician; Megan Mans, MD

## 2015-06-20 NOTE — Progress Notes (Signed)
Pt has a home CPAP unit. Pt states that he doesn't need any help with the unit. Pt was encouraged to call should he need help.

## 2015-06-21 ENCOUNTER — Inpatient Hospital Stay: Payer: Medicare Other

## 2015-06-21 NOTE — Discharge Summary (Signed)
[facility] [place] at [facility]   PATIENT NAME: Joshua Larsen    MR#:  [id number]  DATE OF BIRTH:  April 08, 1951  DATE OF ADMISSION:  06/18/2015 ADMITTING PHYSICIAN: Shaune Pollack, MD  DATE OF DISCHARGE: 06/21/2015  4:46 PM  PRIMARY CARE PHYSICIAN: Megan Mans, MD     ADMISSION DIAGNOSIS:  Vertigo [R42] NSTEMI (non-ST elevated myocardial infarction) (HCC) [I21.4]  DISCHARGE DIAGNOSIS:  Principal Problem:   Elevated troponin Active Problems:   Dizziness and giddiness   Symptomatic sinus bradycardia   HTN (hypertension)   Diabetes (HCC)   CKD stage 3 secondary to diabetes (HCC)   OSA (obstructive sleep apnea)   SECONDARY DIAGNOSIS:   Past Medical History  Diagnosis Date  . Hypertension     .pro HOSPITAL COURSE:   The patient is 64 y old male with history of OSA, obesity, hypertension who presented with complaints of chest pressure, dizziness, nausea. His troponin was found to be borderline elevated at about 0.21 times 3, felt to be noncardiac by cardiologist DR Paraschos, although Ranexa was recommended upon discharge. Patient's chest discomfort resolved but he still had dizziness, vertigo symptoms, especially while trying to sit up from lying position or lay down from sitting. Meclizine was initiated and his condition improved but did not resolve completely. 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 Upon discussion with the patient it appeared that he has not had nocturnal oxymetry on CPAP ever, and overnight oxymetry on CPAP was recommended at home to rule out nocturnal hypoxemia and  resultant hypertension etc. Discussion by problem: 1 . Postural dizziness: initially thought to be due to recent increase in metoprolol dose and resultant bradycardia, but still had it with no bradycardia on lower dose  of metoprolol. Patient was advised to get outpatient physical therapy for vestibular manipulation, continue meclizine as needed, MRI of brain showed no stroke, but microvascular disease in the setting of chronic microbleeds. Orthostatic vital signs were normal.  #2 elevated troponin: There has been no change in his minimally elevated troponin level over time. Patient was seen by cardiology, not felt not  to be due to cardiac event.  Ranexa was  recommended as outpatient.  #3 hypertension:  Resume as outpatient  #4 diabetes mellitus type 2: A1c is 6.9 recently. Not on hypoglycemic agents. No SSI needed  #5 CK D stage III: Stable unchanged  #6 obstructive sleep apnea: Continue CPAP, needs overnight oxymetry on CPAP at home to rule out nocturnal hypoxemia causing MRI changes.  DISCHARGE CONDITIONS:   fair  CONSULTS OBTAINED:  Treatment Team:  Marcina Millard, MD Dalia Heading, MD  DRUG ALLERGIES:   Allergies  Allergen Reactions  . Amoxicillin   . Codeine     GI side effects  . Tagamet  [Cimetidine] Other (See Comments)    Causes changes in mental status Causes changes in mental status    DISCHARGE MEDICATIONS:   Discharge Medication List as of 06/21/2015  2:10 PM    START taking these medications   Details  meclizine (ANTIVERT) 25 MG tablet Take 1 tablet (25 mg total) by mouth 3 (three) times daily., Starting 06/20/2015, Until Discontinued, Normal    metoprolol tartrate (LOPRESSOR) 25 MG tablet Take 1 tablet (25 mg total) by mouth 2 (two) times daily., Starting 06/20/2015, Until Discontinued, Normal    ranolazine (RANEXA) 500 MG 12 hr tablet  Take 1 tablet (500 mg total) by mouth 2 (two) times daily., Starting 06/20/2015, Until Discontinued, Normal      CONTINUE these medications which have NOT CHANGED   Details  allopurinol (ZYLOPRIM) 100 MG tablet Take 1 tablet (100 mg total) by mouth daily., Starting 02/12/2015, Until Discontinued, Normal    aspirin EC 81 MG tablet  Take 81 mg by mouth daily., Until Discontinued, Historical Med    atorvastatin (LIPITOR) 20 MG tablet Take 20 mg by mouth every evening. , Until Discontinued, Historical Med    clopidogrel (PLAVIX) 75 MG tablet Take 75 mg by mouth daily. , Until Discontinued, Historical Med    esomeprazole (NEXIUM) 40 MG capsule Take 1 capsule by mouth daily as needed (for acid reflux). , Until Discontinued, Historical Med    fenofibrate 160 MG tablet Take 1 tablet (160 mg total) by mouth daily., Starting 05/14/2015, Until Discontinued, Normal    glucose blood (FREESTYLE TEST STRIPS) test strip Check sugar once daily, Normal    HYDROcodone-acetaminophen (NORCO/VICODIN) 5-325 MG per tablet Take 1 tablet by mouth every 6 (six) hours as needed for moderate pain or severe pain. , Until Discontinued, Historical Med    isosorbide mononitrate (IMDUR) 60 MG 24 hr tablet Take 60 mg by mouth 2 (two) times daily., Until Discontinued, Historical Med    nitroGLYCERIN (NITROSTAT) 0.4 MG SL tablet Place 0.4 mg under the tongue every 5 (five) minutes as needed for chest pain. , Until Discontinued, Historical Med    pioglitazone (ACTOS) 15 MG tablet Take 1 tablet (15 mg total) by mouth daily., Starting 05/14/2015, Until Discontinued, Normal      STOP taking these medications     metoprolol succinate (TOPROL-XL) 50 MG 24 hr tablet      valsartan-hydrochlorothiazide (DIOVAN-HCT) 160-25 MG per tablet          DISCHARGE INSTRUCTIONS:    Patient is to follow up with PCP Dr. Sullivan Lone  If you experience worsening of your admission symptoms, develop shortness of breath, life threatening emergency, suicidal or homicidal thoughts you must seek medical attention immediately by calling 911 or calling your MD immediately  if symptoms less severe.  You Must read complete instructions/literature along with all the possible adverse reactions/side effects for all the Medicines you take and that have been prescribed to you. Take any  new Medicines after you have completely understood and accept all the possible adverse reactions/side effects.   Please note  You were cared for by a hospitalist during your hospital stay. If you have any questions about your discharge medications or the care you received while you were in the hospital after you are discharged, you can call the unit and asked to speak with the hospitalist on call if the hospitalist that took care of you is not available. Once you are discharged, your primary care physician will handle any further medical issues. Please note that NO REFILLS for any discharge medications will be authorized once you are discharged, as it is imperative that you return to your primary care physician (or establish a relationship with a primary care physician if you do not have one) for your aftercare needs so that they can reassess your need for medications and monitor your lab values.    Today   CHIEF COMPLAINT:   Chief Complaint  Patient presents with  . Chest Pain    HISTORY OF PRESENT ILLNESS:  Max Nuno  is a 63 y.o. male with a known history of OSA, HTN, obesity who  presented with complaints of chest pressure, dizziness, nausea. His troponin was found to be borderline elevated at about 0.21 times 3, felt to be noncardiac by cardiologist DR Paraschos, although Ranexa was recommended upon discharge. Patient's chest discomfort resolved but he still had dizziness, vertigo symptoms, especially while trying to sit up from lying position or lay down from sitting. Meclizine was initiated and his condition improved but did not resolve completely. 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 Upon discussion with the patient it appeared that he has not had nocturnal oxymetry on CPAP ever, and overnight oxymetry on CPAP was recommended at home  to rule out nocturnal hypoxemia and  resultant hypertension etc. Discussion by problem: 1 . Postural dizziness: initially thought to be due to recent increase in metoprolol dose and resultant bradycardia, but still had it with no bradycardia on lower dose of metoprolol. Patient was advised to get outpatient physical therapy for vestibular manipulation, continue meclizine as needed, MRI of brain showed no stroke, but microvascular disease in the setting of chronic microbleeds. Orthostatic vital signs were normal.  #2 elevated troponin: There has been no change in his minimally elevated troponin level over time. Patient was seen by cardiology, not felt not  to be due to cardiac event.  Ranexa was  recommended as outpatient.  #3 hypertension:  Resume as outpatient  #4 diabetes mellitus type 2: A1c is 6.9 recently. Not on hypoglycemic agents. No SSI needed  #5 CK D stage III: Stable unchanged  #6 obstructive sleep apnea: Continue CPAP, needs overnight oxymetry on CPAP at home to rule out nocturnal hypoxemia causing MRI changes.   VITAL SIGNS:  Blood pressure 114/51, pulse 68, temperature 97.7 F (36.5 C), temperature source Oral, resp. rate 18, height 5\' 9"  (1.753 m), weight 140.298 kg (309 lb 4.8 oz), SpO2 99 %.  I/O:   Intake/Output Summary (Last 24 hours) at 06/21/15 1801 Last data filed at 06/21/15 1354  Gross per 24 hour  Intake    240 ml  Output   2100 ml  Net  -1860 ml    PHYSICAL EXAMINATION:  GENERAL:  64 y.o.-year-old patient lying in the bed with no acute distress.  EYES: Pupils equal, round, reactive to light and accommodation. No scleral icterus. Extraocular muscles intact.  HEENT: Head atraumatic, normocephalic. Oropharynx and nasopharynx clear.  NECK:  Supple, no jugular venous distention. No thyroid enlargement, no tenderness.  LUNGS: Normal breath sounds bilaterally, no wheezing, rales,rhonchi or crepitation. No use of accessory muscles of respiration.  CARDIOVASCULAR:  S1, S2 normal. No murmurs, rubs, or gallops.  ABDOMEN: Soft, non-tender, non-distended. Bowel sounds present. No organomegaly or mass.  EXTREMITIES: No pedal edema, cyanosis, or clubbing.  NEUROLOGIC: Cranial nerves II through XII are intact. Muscle strength 5/5 in all extremities. Sensation intact. Gait not checked.  PSYCHIATRIC: The patient is alert and oriented x 3.  SKIN: No obvious rash, lesion, or ulcer.   DATA REVIEW:   CBC  Recent Labs Lab 06/20/15 0500  WBC 6.9  HGB 10.7*  HCT 32.6*  PLT 177    Chemistries   Recent Labs Lab 06/18/15 1748 06/20/15 0500  NA 139 142  K 4.5 4.4  CL 110 109  CO2 23 25  GLUCOSE 138* 121*  BUN 32* 30*  CREATININE 1.77* 1.66*  CALCIUM 9.1 8.9  AST 24  --   ALT 24  --  ALKPHOS 33*  --   BILITOT 0.7  --     Cardiac Enzymes  Recent Labs Lab 06/19/15 0428  TROPONINI 0.21*    Microbiology Results  Results for orders placed or performed in visit on 12/13/13  Urine culture     Status: None   Collection Time: 12/13/13 12:00 PM  Result Value Ref Range Status   Micro Text Report   Final       SOURCE: CLEAN CATCH    COMMENT                   NO GROWTH IN 36 HOURS   ANTIBIOTIC                                                      Urine culture     Status: None   Collection Time: 12/14/13  1:29 PM  Result Value Ref Range Status   Micro Text Report   Final       SOURCE: IN/OUT CATH    COMMENT                   NO GROWTH IN 18-24 HOURS   ANTIBIOTIC                                                        RADIOLOGY:  Mr Brain Wo Contrast  06/21/2015  CLINICAL DATA:  Dizziness and nausea. History of hypertension and recent myocardial infarction. Initial encounter. EXAM: MRI HEAD WITHOUT CONTRAST TECHNIQUE: Multiplanar, multiecho pulse sequences of the brain and surrounding structures were obtained without intravenous contrast. COMPARISON:  None. FINDINGS: No evidence for acute infarction, hemorrhage, mass lesion,  hydrocephalus, or extra-axial fluid. Normal for age cerebral volume. Mild subcortical and periventricular T2 and FLAIR hyperintensities, likely chronic microvascular ischemic change. Tiny foci of chronic hemorrhage throughout the cerebral hemispheres, cerebellum, and deep nuclei likely sequelae of hypertensive cerebral vascular disease. Cerebral amyloid angiopathy less favored. Flow voids are maintained in the carotid, basilar, and both vertebral arteries. Normal pituitary and cerebellar tonsils. Visualized calvarium, skull base, and upper cervical osseous structures unremarkable. Scalp and extracranial soft tissues, orbits, sinuses, and mastoids show no acute process. IMPRESSION: Minor white matter disease, in the setting of numerous microbleeds throughout the brain, suggest chronic hypertensive cerebrovascular disease. There is no acute stroke or acute hemorrhage observed. Electronically Signed   By: Elsie Stain M.D.   On: 06/21/2015 15:05    EKG:   Orders placed or performed during the hospital encounter of 06/18/15  . EKG 12-Lead  . EKG 12-Lead  . EKG 12-Lead  . EKG 12-Lead  . ED EKG within 10 minutes  . ED EKG within 10 minutes      Management plans discussed with the patient, family and they are in agreement.  CODE STATUS:     Code Status Orders        Start     Ordered   06/18/15 2021  Full code   Continuous     06/18/15 2020      TOTAL TIME TAKING CARE OF THIS PATIENT: 40 minutes.    Katharina Caper M.D on 06/21/2015 at 6:01 PM  Between 7am to 6pm - Pager - [phone]  After 6pm go to [email or web address] - password [email or web address] [facility]  [employer] [facility]  Office  [phone]  CC: Primary care physician; Megan Mans, MD

## 2015-06-21 NOTE — Progress Notes (Signed)
[facility]  SUBJECTIVE: I'm still having dizzy spells   Filed Vitals:   06/20/15 1206 06/20/15 1956 06/21/15 0439 06/21/15 0500  BP: 120/63 124/64 119/62   Pulse: 67 71 52   Temp: 98.1 F (36.7 C) 98.3 F (36.8 C) 98.8 F (37.1 C)   TempSrc: Oral     Resp: 20 19 20    Height:      Weight:    140.298 kg (309 lb 4.8 oz)  SpO2: 96% 98% 99%      Intake/Output Summary (Last 24 hours) at 06/21/15 1031 Last data filed at 06/21/15 1013  Gross per 24 hour  Intake    240 ml  Output   2250 ml  Net  -2010 ml      PHYSICAL EXAM  General: Well developed, well nourished, in no acute distress HEENT:  Normocephalic and atramatic Neck:  No JVD.  Lungs: Clear bilaterally to auscultation and percussion. Heart: HRRR . Normal S1 and S2 without gallops or murmurs.  Abdomen: Bowel sounds are positive, abdomen soft and non-tender  Msk:  Back normal, normal gait. Normal strength and tone for age. Extremities: No clubbing, cyanosis or edema.   Neuro: Alert and oriented X 3. Psych:  Good affect, responds appropriately   LABS: Basic Metabolic Panel:  Recent Labs  16/24/46 1748 06/20/15 0500  NA 139 142  K 4.5 4.4  CL 110 109  CO2 23 25  GLUCOSE 138* 121*  BUN 32* 30*  CREATININE 1.77* 1.66*  CALCIUM 9.1 8.9   Liver Function Tests:  Recent Labs  06/18/15 1748  AST 24  ALT 24  ALKPHOS 33*  BILITOT 0.7  PROT 7.4  ALBUMIN 4.0    Recent Labs  06/18/15 1748  LIPASE 40   CBC:  Recent Labs  06/19/15 0428 06/20/15 0500  WBC 7.3 6.9  HGB 10.8* 10.7*  HCT 32.7* 32.6*  MCV 77.4* 76.7*  PLT 173 177   Cardiac Enzymes:  Recent Labs  06/18/15 1748 06/18/15 2039 06/19/15 0428  TROPONINI 0.21* 0.23* 0.21*   BNP: Invalid input(s): POCBNP D-Dimer: No results for input(s): DDIMER in the last 72 hours. Hemoglobin A1C: No results for input(s): HGBA1C in the last 72 hours. Fasting Lipid Panel: No results for input(s): CHOL, HDL, LDLCALC, TRIG, CHOLHDL,  LDLDIRECT in the last 72 hours. Thyroid Function Tests: No results for input(s): TSH, T4TOTAL, T3FREE, THYROIDAB in the last 72 hours.  Invalid input(s): FREET3 Anemia Panel: No results for input(s): VITAMINB12, FOLATE, FERRITIN, TIBC, IRON, RETICCTPCT in the last 72 hours.  No results found.   Echo   TELEMETRY: Normal sinus rhythm:  ASSESSMENT AND PLAN:  Principal Problem:   Elevated troponin Active Problems:   Dizziness and giddiness   Symptomatic sinus bradycardia   HTN (hypertension)   Diabetes (HCC)   CKD stage 3 secondary to diabetes (HCC)   OSA (obstructive sleep apnea)    1. Postural dizziness, modestly improved, no correlation with telemetry, MRI pending 2. Recurrent chest pain, with borderline elevated troponin, without peak or trough, unlikely due to acute coronary syndrome, overall improved, started Ranexa 3. Known CAD, status post CABG 5, with known stent bypass graft, previously told that no further stenting could be done, patient reluctant to undergo repeat cardiac catheterization at this time  Recommendations  1. Continue current therapy 2. Defer cardiac catheterization 3. Continue Ranexa 4. Await MRI results 5. Neurology consult pending MRI results  Signed off for now, please call if any questions   Srija Southard, MD, PhD,  [facility] 06/21/2015 10:31 AM

## 2015-06-21 NOTE — Progress Notes (Signed)
  Outpatient physical therapy evaluation for vestibular manipulation - vertigo

## 2015-06-21 NOTE — Progress Notes (Signed)
MD Winona Legato was made aware of MRI results. D/c pt home.

## 2015-06-21 NOTE — Progress Notes (Addendum)
A & O. Went for MRI. Pt reported still having dizziness. Huston Foley. Room air. Up in room to chair and tolerated it well. No pain. IV and tele removed. Discharge instructions reviewed. Pt has no further concerns at this time.

## 2015-06-24 ENCOUNTER — Ambulatory Visit (INDEPENDENT_AMBULATORY_CARE_PROVIDER_SITE_OTHER): Payer: Medicare Other | Admitting: Family Medicine

## 2015-06-24 VITALS — BP 116/58 | HR 84 | Temp 98.4°F | Resp 16 | Wt 313.0 lb

## 2015-06-24 DIAGNOSIS — G4730 Sleep apnea, unspecified: Secondary | ICD-10-CM

## 2015-06-24 DIAGNOSIS — Z09 Encounter for follow-up examination after completed treatment for conditions other than malignant neoplasm: Secondary | ICD-10-CM

## 2015-06-24 NOTE — Progress Notes (Signed)
Patient ID: NOMAR BROAD, male   DOB: 23-Feb-1951, 64 y.o.   MRN: [id number]   MAXAMUS COLAO  MRN: [id number] DOB: 1951/02/10  Subjective:  HPI   1. Hospital discharge follow-up Patient is a 64 year old male who presents for follow up after his recent hospital admission.  Patient presented to the hospital with complaints of chest pain and dizziness.  His admission diagnosis was Vertigo and NSTEMI.  His discharge diagnosis was elevated Troponin.  His active problems while in the hospital included dizziness and giddiness, symptomatic sinus bradycardia, HTN, DM, CKD stage 3 secondary to DM, and OSA.  Procedures during his hospital stay included Labs,  EKG, urine culture, MRI.  MRI impression revealed minor white matter disease in setting of numerous microbleeds throughout the brain, suggest chronic hypertensive cerebrovascular disease.  Recommendation made for overnight O2 study to rule out nocturnal hypoxemia causing the changes found on MRI.   Patient Active Problem List   Diagnosis Date Noted  . Dizziness and giddiness 06/20/2015  . Symptomatic sinus bradycardia 06/20/2015  . HTN (hypertension) 06/20/2015  . Diabetes (HCC) 06/20/2015  . CKD stage 3 secondary to diabetes (HCC) 06/20/2015  . OSA (obstructive sleep apnea) 06/20/2015  . Elevated troponin 06/18/2015  . Type 2 diabetes mellitus (HCC) 06/10/2015  . Alopecia 02/11/2015  . Adaptation reaction 02/11/2015  . Acute MI (HCC) 02/11/2015  . Arteriosclerosis of coronary artery 02/11/2015  . Clinical depression 02/11/2015  . Diabetes mellitus, type 2 (HCC) 02/11/2015  . Acid reflux 02/11/2015  . Gout 02/11/2015  . Hepatitis A 02/11/2015  . Asthma 02/11/2015  . HLD (hyperlipidemia) 02/11/2015  . BP (high blood pressure) 02/11/2015  . Cannot sleep 02/11/2015  . Arthritis, degenerative 02/11/2015  . Adiposity 02/11/2015  . Acute kidney failure (HCC) 02/11/2015  . Apnea, sleep 02/11/2015  . Calculus of kidney 12/25/2013  . CAD  in native artery 12/20/2013    Past Medical History  Diagnosis Date  . Hypertension     Social History   Social History  . Marital Status: Divorced    Spouse Name: divorced  . Number of Children: 1  . Years of Education: 14   Occupational History  . retired    Social History Main Topics  . Smoking status: Never Smoker   . Smokeless tobacco: Never Used  . Alcohol Use: No  . Drug Use: No  . Sexual Activity: No   Other Topics Concern  . Not on file   Social History Narrative    Outpatient Prescriptions Prior to Visit  Medication Sig Dispense Refill  . allopurinol (ZYLOPRIM) 100 MG tablet Take 1 tablet (100 mg total) by mouth daily. 30 tablet 12  . aspirin EC 81 MG tablet Take 81 mg by mouth daily.    Marland Kitchen atorvastatin (LIPITOR) 20 MG tablet Take 20 mg by mouth every evening.     . clopidogrel (PLAVIX) 75 MG tablet Take 75 mg by mouth daily.     Marland Kitchen esomeprazole (NEXIUM) 40 MG capsule Take 1 capsule by mouth daily as needed (for acid reflux).     . fenofibrate 160 MG tablet Take 1 tablet (160 mg total) by mouth daily. (Patient taking differently: Take 160 mg by mouth every evening. ) 30 tablet 12  . glucose blood (FREESTYLE TEST STRIPS) test strip Check sugar once daily 100 each 12  . HYDROcodone-acetaminophen (NORCO/VICODIN) 5-325 MG per tablet Take 1 tablet by mouth every 6 (six) hours as needed for moderate pain or severe pain.     Marland Kitchen  isosorbide mononitrate (IMDUR) 60 MG 24 hr tablet Take 60 mg by mouth 2 (two) times daily.  5  . meclizine (ANTIVERT) 25 MG tablet Take 1 tablet (25 mg total) by mouth 3 (three) times daily. 60 tablet 3  . metoprolol tartrate (LOPRESSOR) 25 MG tablet Take 1 tablet (25 mg total) by mouth 2 (two) times daily. 60 tablet 6  . nitroGLYCERIN (NITROSTAT) 0.4 MG SL tablet Place 0.4 mg under the tongue every 5 (five) minutes as needed for chest pain.     . pioglitazone (ACTOS) 15 MG tablet Take 1 tablet (15 mg total) by mouth daily. (Patient not taking:  Reported on 06/18/2015) 30 tablet 12  . ranolazine (RANEXA) 500 MG 12 hr tablet Take 1 tablet (500 mg total) by mouth 2 (two) times daily. 60 tablet 6   No facility-administered medications prior to visit.    Allergies  Allergen Reactions  . Amoxicillin   . Codeine     GI side effects  . Tagamet  [Cimetidine] Other (See Comments)    Causes changes in mental status Causes changes in mental status    Review of Systems  Constitutional: Negative for fever, chills, weight loss, malaise/fatigue and diaphoresis.  Eyes: Negative.   Respiratory: Negative.   Cardiovascular: Negative.   Gastrointestinal: Negative.   Skin: Negative.   Neurological: Positive for dizziness (He states his dizziness is at its worse when he first wakes up and sits up in the bed.). Negative for weakness and headaches.  Endo/Heme/Allergies: Negative.   Psychiatric/Behavioral: Negative.    Objective:  BP 116/58 mmHg  Pulse 84  Temp(Src) 98.4 F (36.9 C) (Oral)  Resp 16  Wt 313 lb (141.976 kg)  Physical Exam  Constitutional: He is oriented to person, place, and time and well-developed, well-nourished, and in no distress.  Obese WMNAD.  HENT:  Head: Normocephalic and atraumatic.  Right Ear: External ear normal.  Left Ear: External ear normal.  Nose: Nose normal.  Eyes: Conjunctivae are normal. Pupils are equal, round, and reactive to light.  Neck: Normal range of motion. Neck supple.  Cardiovascular: Normal rate, regular rhythm and normal heart sounds.   Pulmonary/Chest: Effort normal and breath sounds normal.  Abdominal: Soft.  Neurological: He is alert and oriented to person, place, and time. Gait normal.  Skin: Skin is warm and dry.  Psychiatric: Mood, memory, affect and judgment normal.  Neuro grossly nonfocal except for mild nystagmus to right.  Assessment and Plan :  Hospital discharge follow-up NSTEMI by elevated Troponin. Ischemic Cardiomyopathy Max medical management by cardiology. Ranexa  added. OSA--on CPAP Vertigo Consider ENT or Neurology referral.Consider steroids. MRI reveals white matter disease with possible small local bleeding (microbleeds). Refer to Neurology. Morbid Obesity HTN HLD TIIDM More than 50% of time spent reviewing the issues and in counselling regarding these problems. I have done the exam and reviewed the above chart and it is accurate to the best of my knowledge.  Julieanne Manson MD [facility] 06/24/2015 3:03 PM

## 2015-07-14 ENCOUNTER — Encounter: Payer: Self-pay | Admitting: Family Medicine

## 2015-07-14 ENCOUNTER — Ambulatory Visit (INDEPENDENT_AMBULATORY_CARE_PROVIDER_SITE_OTHER): Payer: Medicare Other | Admitting: Family Medicine

## 2015-07-14 VITALS — BP 122/64 | HR 64 | Temp 97.8°F | Resp 16 | Wt 315.0 lb

## 2015-07-14 DIAGNOSIS — G4733 Obstructive sleep apnea (adult) (pediatric): Secondary | ICD-10-CM

## 2015-07-14 DIAGNOSIS — E785 Hyperlipidemia, unspecified: Secondary | ICD-10-CM

## 2015-07-14 DIAGNOSIS — E118 Type 2 diabetes mellitus with unspecified complications: Secondary | ICD-10-CM

## 2015-07-14 DIAGNOSIS — I10 Essential (primary) hypertension: Secondary | ICD-10-CM

## 2015-07-14 DIAGNOSIS — N183 Chronic kidney disease, stage 3 unspecified: Secondary | ICD-10-CM

## 2015-07-14 DIAGNOSIS — I255 Ischemic cardiomyopathy: Secondary | ICD-10-CM

## 2015-07-14 DIAGNOSIS — E1122 Type 2 diabetes mellitus with diabetic chronic kidney disease: Secondary | ICD-10-CM | POA: Diagnosis not present

## 2015-07-14 DIAGNOSIS — R42 Dizziness and giddiness: Secondary | ICD-10-CM | POA: Diagnosis not present

## 2015-07-14 NOTE — Progress Notes (Signed)
Patient ID: Joshua Larsen, male   DOB: 1951-08-02, 64 y.o.   MRN: [id number]     Subjective:  HPI Pt is here for a 3-4 week follow from starting Ranexa for Ischemic Cardiomyopathy. He reports that he does not have any side effects and is doing well on the medication.    For Vertigo he has an appointment with Dr. Andee Poles on December 2nd because the dizziness is not much better.    Prior to Admission medications   Medication Sig Start Date End Date Taking? Authorizing Provider  allopurinol (ZYLOPRIM) 100 MG tablet Take 1 tablet (100 mg total) by mouth daily. 02/12/15  Yes Richard Hulen Shouts., MD  aspirin EC 81 MG tablet Take 81 mg by mouth daily.   Yes Historical Provider, MD  atorvastatin (LIPITOR) 20 MG tablet Take 20 mg by mouth every evening.    Yes Historical Provider, MD  clopidogrel (PLAVIX) 75 MG tablet Take 75 mg by mouth daily.    Yes Historical Provider, MD  esomeprazole (NEXIUM) 40 MG capsule Take 1 capsule by mouth daily as needed (for acid reflux).    Yes Historical Provider, MD  fenofibrate 160 MG tablet Take 1 tablet (160 mg total) by mouth daily. Patient taking differently: Take 160 mg by mouth every evening.  05/14/15  Yes Maple Hudson., MD  glucose blood (FREESTYLE TEST STRIPS) test strip Check sugar once daily 04/14/15  Yes Richard Hulen Shouts., MD  HYDROcodone-acetaminophen (NORCO/VICODIN) 5-325 MG per tablet Take 1 tablet by mouth every 6 (six) hours as needed for moderate pain or severe pain.    Yes Historical Provider, MD  isosorbide mononitrate (IMDUR) 60 MG 24 hr tablet Take 60 mg by mouth 2 (two) times daily.   Yes Historical Provider, MD  meclizine (ANTIVERT) 25 MG tablet Take 1 tablet (25 mg total) by mouth 3 (three) times daily. 06/20/15  Yes Katharina Caper, MD  metoprolol tartrate (LOPRESSOR) 25 MG tablet Take 1 tablet (25 mg total) by mouth 2 (two) times daily. 06/20/15  Yes Katharina Caper, MD  nitroGLYCERIN (NITROSTAT) 0.4 MG SL tablet Place 0.4 mg under  the tongue every 5 (five) minutes as needed for chest pain.    Yes Historical Provider, MD  pioglitazone (ACTOS) 15 MG tablet Take 1 tablet (15 mg total) by mouth daily. 05/14/15  Yes Richard Hulen Shouts., MD  ranolazine (RANEXA) 500 MG 12 hr tablet Take 1 tablet (500 mg total) by mouth 2 (two) times daily. 06/20/15  Yes Katharina Caper, MD    Patient Active Problem List   Diagnosis Date Noted  . Dizziness and giddiness 06/20/2015  . Symptomatic sinus bradycardia 06/20/2015  . HTN (hypertension) 06/20/2015  . Diabetes (HCC) 06/20/2015  . CKD stage 3 secondary to diabetes (HCC) 06/20/2015  . OSA (obstructive sleep apnea) 06/20/2015  . Elevated troponin 06/18/2015  . Type 2 diabetes mellitus (HCC) 06/10/2015  . Alopecia 02/11/2015  . Adaptation reaction 02/11/2015  . Acute MI (HCC) 02/11/2015  . Arteriosclerosis of coronary artery 02/11/2015  . Clinical depression 02/11/2015  . Diabetes mellitus, type 2 (HCC) 02/11/2015  . Acid reflux 02/11/2015  . Gout 02/11/2015  . Hepatitis A 02/11/2015  . Asthma 02/11/2015  . HLD (hyperlipidemia) 02/11/2015  . BP (high blood pressure) 02/11/2015  . Cannot sleep 02/11/2015  . Arthritis, degenerative 02/11/2015  . Adiposity 02/11/2015  . Acute kidney failure (HCC) 02/11/2015  . Apnea, sleep 02/11/2015  . Calculus of kidney 12/25/2013  . CAD in native  artery 12/20/2013    Past Medical History  Diagnosis Date  . Hypertension     Social History   Social History  . Marital Status: Divorced    Spouse Name: divorced  . Number of Children: 1  . Years of Education: 14   Occupational History  . retired    Social History Main Topics  . Smoking status: Never Smoker   . Smokeless tobacco: Never Used  . Alcohol Use: No  . Drug Use: No  . Sexual Activity: No   Other Topics Concern  . Not on file   Social History Narrative    Allergies  Allergen Reactions  . Amoxicillin   . Codeine     GI side effects  . Tagamet  [Cimetidine]  Other (See Comments)    Causes changes in mental status Causes changes in mental status    Review of Systems  Constitutional: Negative.   HENT: Negative.   Eyes: Negative.   Respiratory: Negative.   Cardiovascular: Negative.   Gastrointestinal: Negative.   Genitourinary: Negative.   Musculoskeletal: Negative.   Skin: Negative.   Neurological: Positive for dizziness.  Endo/Heme/Allergies: Negative.   Psychiatric/Behavioral: Negative.     Immunization History  Administered Date(s) Administered  . Pneumococcal Polysaccharide-23 03/15/2012  . Tdap 06/06/2007   Objective:  BP 122/64 mmHg  Pulse 64  Temp(Src) 97.8 F (36.6 C) (Oral)  Resp 16  Wt 315 lb (142.883 kg)  Physical Exam  Constitutional: He is oriented to person, place, and time and well-developed, well-nourished, and in no distress.  HENT:  Head: Normocephalic and atraumatic.  Right Ear: External ear normal.  Left Ear: External ear normal.  Nose: Nose normal.  Mouth/Throat: Oropharynx is clear and moist.  Eyes: Conjunctivae and EOM are normal.  Mild nystagmus of left eye.  Neck: Normal range of motion. Neck supple.  Cardiovascular: Normal rate, regular rhythm, normal heart sounds and intact distal pulses.   Pulmonary/Chest: Effort normal and breath sounds normal.  Abdominal: Soft.  Musculoskeletal: Normal range of motion.  Neurological: He is alert and oriented to person, place, and time. He has normal reflexes. Gait normal. GCS score is 15.  Skin: Skin is warm and dry.  Psychiatric: Mood, memory, affect and judgment normal.    Lab Results  Component Value Date   WBC 6.9 06/20/2015   HGB 10.7* 06/20/2015   HCT 32.6* 06/20/2015   PLT 177 06/20/2015   GLUCOSE 121* 06/20/2015   CHOL 115 06/08/2015   TRIG 168* 06/08/2015   HDL 18* 06/08/2015   LDLCALC 63 06/08/2015   TSH 0.897 06/10/2015   PSA 2.8 06/10/2015   INR 1.11 06/18/2015   HGBA1C 6.9* 06/08/2015    CMP     Component Value Date/Time    NA 142 06/20/2015 0500   NA 142 06/08/2015 1137   NA 140 09/17/2014 0339   K 4.4 06/20/2015 0500   K 4.6 09/17/2014 0339   CL 109 06/20/2015 0500   CL 106 09/17/2014 0339   CO2 25 06/20/2015 0500   CO2 26 09/17/2014 0339   GLUCOSE 121* 06/20/2015 0500   GLUCOSE 146* 06/08/2015 1137   GLUCOSE 122* 09/17/2014 0339   BUN 30* 06/20/2015 0500   BUN 32* 06/08/2015 1137   BUN 26* 09/17/2014 0339   CREATININE 1.66* 06/20/2015 0500   CREATININE 1.70* 09/17/2014 0339   CREATININE 1.8* 11/08/2013   CALCIUM 8.9 06/20/2015 0500   CALCIUM 9.1 09/17/2014 0339   PROT 7.4 06/18/2015 1748   PROT 6.9  06/08/2015 1137   PROT 8.5* 12/13/2013 1159   ALBUMIN 4.0 06/18/2015 1748   ALBUMIN 4.3 06/08/2015 1137   ALBUMIN 3.8 12/13/2013 1159   AST 24 06/18/2015 1748   AST 15 12/13/2013 1159   ALT 24 06/18/2015 1748   ALT 21 12/13/2013 1159   ALKPHOS 33* 06/18/2015 1748   ALKPHOS 32* 12/13/2013 1159   BILITOT 0.7 06/18/2015 1748   BILITOT 0.5 06/08/2015 1137   BILITOT 0.8 12/13/2013 1159   GFRNONAA 42* 06/20/2015 0500   GFRNONAA 43* 09/17/2014 0339   GFRNONAA 28* 12/16/2013 0725   GFRAA 49* 06/20/2015 0500   GFRAA 53* 09/17/2014 0339   GFRAA 33* 12/16/2013 0725    Assessment and Plan :   1. Ischemic cardiomyopathy Continue Ranexa   2. Essential hypertension stable 3. Type 2 diabetes mellitus with complication, without long-term current use of insulin (HCC) Stable  4. HLD (hyperlipidemia) Stable.  5. CKD stage 3 secondary to diabetes (HCC)   6. OSA (obstructive sleep apnea)  pending overnight pulse oximetry    7. Dizziness - Referral to Neurology Patient was seen and examined by Dr. Julieanne Manson, and noted scribed by Dimas Chyle, CMA Overall pt improving.  Julieanne Manson MD [facility] 07/14/2015 3:43 PM

## 2015-07-24 ENCOUNTER — Other Ambulatory Visit: Payer: Self-pay | Admitting: Otolaryngology

## 2015-07-24 DIAGNOSIS — R42 Dizziness and giddiness: Secondary | ICD-10-CM

## 2015-07-31 ENCOUNTER — Ambulatory Visit
Admission: RE | Admit: 2015-07-31 | Discharge: 2015-07-31 | Disposition: A | Discharge status: Home / Self Care | Payer: Medicare Other | Source: Ambulatory Visit | Attending: Otolaryngology | Admitting: Otolaryngology

## 2015-07-31 DIAGNOSIS — R42 Dizziness and giddiness: Secondary | ICD-10-CM | POA: Insufficient documentation

## 2015-08-03 ENCOUNTER — Ambulatory Visit: Payer: Medicare Other | Admitting: Family Medicine

## 2015-08-13 ENCOUNTER — Other Ambulatory Visit: Payer: Self-pay | Admitting: Otolaryngology

## 2015-08-13 ENCOUNTER — Ambulatory Visit: Payer: Medicare Other | Admitting: Family Medicine

## 2015-08-13 DIAGNOSIS — E041 Nontoxic single thyroid nodule: Secondary | ICD-10-CM

## 2015-08-20 ENCOUNTER — Ambulatory Visit
Admission: RE | Admit: 2015-08-20 | Discharge: 2015-08-20 | Disposition: A | Discharge status: Home / Self Care | Payer: Medicare Other | Source: Ambulatory Visit | Attending: Otolaryngology | Admitting: Otolaryngology

## 2015-08-20 DIAGNOSIS — E041 Nontoxic single thyroid nodule: Secondary | ICD-10-CM | POA: Diagnosis present

## 2015-08-20 HISTORY — DX: Gastro-esophageal reflux disease without esophagitis: K21.9

## 2015-08-20 HISTORY — DX: Sleep apnea, unspecified: G47.30

## 2015-08-20 LAB — PROTIME-INR
INR: 1.08
Prothrombin Time: 14.2 seconds (ref 11.4–15.0)

## 2015-08-20 LAB — CBC
HEMATOCRIT: 36 % — AB (ref 40.0–52.0)
Hemoglobin: 11.5 g/dL — ABNORMAL LOW (ref 13.0–18.0)
MCH: 24.4 pg — AB (ref 26.0–34.0)
MCHC: 32.1 g/dL (ref 32.0–36.0)
MCV: 76 fL — AB (ref 80.0–100.0)
Platelets: 168 10*3/uL (ref 150–440)
RBC: 4.73 MIL/uL (ref 4.40–5.90)
RDW: 18.1 % — AB (ref 11.5–14.5)
WBC: 7.3 10*3/uL (ref 3.8–10.6)

## 2015-08-20 LAB — APTT: APTT: 34 s (ref 24–36)

## 2015-08-21 LAB — CYTOLOGY - NON PAP

## 2015-09-10 ENCOUNTER — Ambulatory Visit: Payer: Medicare Other | Admitting: Family Medicine

## 2015-09-23 DIAGNOSIS — E854 Organ-limited amyloidosis: Secondary | ICD-10-CM | POA: Insufficient documentation

## 2015-09-23 DIAGNOSIS — I680 Cerebral amyloid angiopathy: Secondary | ICD-10-CM

## 2015-11-04 ENCOUNTER — Inpatient Hospital Stay
Admit: 2015-11-04 | Discharge: 2015-11-04 | Disposition: A | Discharge status: Home / Self Care | Payer: Medicare Other | Attending: Internal Medicine | Admitting: Internal Medicine

## 2015-11-04 ENCOUNTER — Inpatient Hospital Stay
Admission: EM | Admit: 2015-11-04 | Discharge: 2015-11-05 | DRG: 280 | Disposition: A | Discharge status: Other Acute Inpatient Hospital | Payer: Medicare Other | Attending: Internal Medicine | Admitting: Internal Medicine

## 2015-11-04 ENCOUNTER — Emergency Department: Payer: Medicare Other

## 2015-11-04 DIAGNOSIS — E1122 Type 2 diabetes mellitus with diabetic chronic kidney disease: Secondary | ICD-10-CM | POA: Diagnosis present

## 2015-11-04 DIAGNOSIS — Z96653 Presence of artificial knee joint, bilateral: Secondary | ICD-10-CM | POA: Diagnosis present

## 2015-11-04 DIAGNOSIS — Z7982 Long term (current) use of aspirin: Secondary | ICD-10-CM

## 2015-11-04 DIAGNOSIS — Z8261 Family history of arthritis: Secondary | ICD-10-CM | POA: Diagnosis not present

## 2015-11-04 DIAGNOSIS — Z951 Presence of aortocoronary bypass graft: Secondary | ICD-10-CM

## 2015-11-04 DIAGNOSIS — Z800 Family history of malignant neoplasm of digestive organs: Secondary | ICD-10-CM

## 2015-11-04 DIAGNOSIS — N189 Chronic kidney disease, unspecified: Secondary | ICD-10-CM | POA: Diagnosis present

## 2015-11-04 DIAGNOSIS — I252 Old myocardial infarction: Secondary | ICD-10-CM

## 2015-11-04 DIAGNOSIS — I129 Hypertensive chronic kidney disease with stage 1 through stage 4 chronic kidney disease, or unspecified chronic kidney disease: Secondary | ICD-10-CM | POA: Diagnosis present

## 2015-11-04 DIAGNOSIS — I4901 Ventricular fibrillation: Secondary | ICD-10-CM | POA: Diagnosis present

## 2015-11-04 DIAGNOSIS — I447 Left bundle-branch block, unspecified: Secondary | ICD-10-CM | POA: Diagnosis present

## 2015-11-04 DIAGNOSIS — Z79899 Other long term (current) drug therapy: Secondary | ICD-10-CM | POA: Diagnosis not present

## 2015-11-04 DIAGNOSIS — R570 Cardiogenic shock: Secondary | ICD-10-CM | POA: Diagnosis present

## 2015-11-04 DIAGNOSIS — K219 Gastro-esophageal reflux disease without esophagitis: Secondary | ICD-10-CM | POA: Diagnosis present

## 2015-11-04 DIAGNOSIS — Z7902 Long term (current) use of antithrombotics/antiplatelets: Secondary | ICD-10-CM | POA: Diagnosis not present

## 2015-11-04 DIAGNOSIS — I25700 Atherosclerosis of coronary artery bypass graft(s), unspecified, with unstable angina pectoris: Secondary | ICD-10-CM | POA: Diagnosis present

## 2015-11-04 DIAGNOSIS — Y832 Surgical operation with anastomosis, bypass or graft as the cause of abnormal reaction of the patient, or of later complication, without mention of misadventure at the time of the procedure: Secondary | ICD-10-CM | POA: Diagnosis present

## 2015-11-04 DIAGNOSIS — Z8249 Family history of ischemic heart disease and other diseases of the circulatory system: Secondary | ICD-10-CM | POA: Diagnosis not present

## 2015-11-04 DIAGNOSIS — G4730 Sleep apnea, unspecified: Secondary | ICD-10-CM | POA: Diagnosis present

## 2015-11-04 DIAGNOSIS — I2582 Chronic total occlusion of coronary artery: Secondary | ICD-10-CM | POA: Diagnosis present

## 2015-11-04 DIAGNOSIS — T82857A Stenosis of cardiac prosthetic devices, implants and grafts, initial encounter: Secondary | ICD-10-CM | POA: Diagnosis present

## 2015-11-04 DIAGNOSIS — I25110 Atherosclerotic heart disease of native coronary artery with unstable angina pectoris: Secondary | ICD-10-CM | POA: Diagnosis present

## 2015-11-04 DIAGNOSIS — I200 Unstable angina: Secondary | ICD-10-CM | POA: Diagnosis present

## 2015-11-04 DIAGNOSIS — R079 Chest pain, unspecified: Secondary | ICD-10-CM

## 2015-11-04 DIAGNOSIS — Z823 Family history of stroke: Secondary | ICD-10-CM | POA: Diagnosis not present

## 2015-11-04 DIAGNOSIS — I700 Atherosclerosis of aorta: Secondary | ICD-10-CM

## 2015-11-04 DIAGNOSIS — I214 Non-ST elevation (NSTEMI) myocardial infarction: Principal | ICD-10-CM | POA: Diagnosis present

## 2015-11-04 DIAGNOSIS — I249 Acute ischemic heart disease, unspecified: Secondary | ICD-10-CM | POA: Diagnosis not present

## 2015-11-04 HISTORY — DX: Other forms of angina pectoris: I20.89

## 2015-11-04 HISTORY — DX: Other forms of angina pectoris: I20.8

## 2015-11-04 HISTORY — DX: Type 2 diabetes mellitus without complications: E11.9

## 2015-11-04 LAB — BASIC METABOLIC PANEL
Anion gap: 7 (ref 5–15)
BUN: 30 mg/dL — AB (ref 6–20)
CALCIUM: 9.1 mg/dL (ref 8.9–10.3)
CO2: 22 mmol/L (ref 22–32)
CREATININE: 1.87 mg/dL — AB (ref 0.61–1.24)
Chloride: 106 mmol/L (ref 101–111)
GFR calc Af Amer: 42 mL/min — ABNORMAL LOW (ref >60)
GFR, EST NON AFRICAN AMERICAN: 36 mL/min — AB (ref >60)
Glucose, Bld: 126 mg/dL — ABNORMAL HIGH (ref 65–99)
POTASSIUM: 4.5 mmol/L (ref 3.5–5.1)
SODIUM: 135 mmol/L (ref 135–145)

## 2015-11-04 LAB — CBC
HEMATOCRIT: 39 % — AB (ref 40.0–52.0)
Hemoglobin: 12.8 g/dL — ABNORMAL LOW (ref 13.0–18.0)
MCH: 25.4 pg — ABNORMAL LOW (ref 26.0–34.0)
MCHC: 32.8 g/dL (ref 32.0–36.0)
MCV: 77.5 fL — ABNORMAL LOW (ref 80.0–100.0)
PLATELETS: 167 10*3/uL (ref 150–440)
RBC: 5.03 MIL/uL (ref 4.40–5.90)
RDW: 17.7 % — AB (ref 11.5–14.5)
WBC: 8.5 10*3/uL (ref 3.8–10.6)

## 2015-11-04 LAB — HEPARIN LEVEL (UNFRACTIONATED)

## 2015-11-04 LAB — PROTIME-INR
INR: 1.12
Prothrombin Time: 14.6 seconds (ref 11.4–15.0)

## 2015-11-04 LAB — TROPONIN I
TROPONIN I: 0.09 ng/mL — AB (ref <0.031)
TROPONIN I: 0.1 ng/mL — AB (ref <0.031)
TROPONIN I: 1.33 ng/mL — AB (ref <0.031)
Troponin I: 5.92 ng/mL — ABNORMAL HIGH (ref <0.031)

## 2015-11-04 LAB — APTT: aPTT: 34 seconds (ref 24–36)

## 2015-11-04 MED ORDER — HYDROMORPHONE HCL 1 MG/ML IJ SOLN: 2.0000 mg | INTRAMUSCULAR | Status: DC | PRN

## 2015-11-04 MED ORDER — SODIUM CHLORIDE 0.9% FLUSH
3.0000 mL | Freq: Two times a day (BID) | INTRAVENOUS | Status: DC
  Administered 2015-11-04: 3 mL via INTRAVENOUS

## 2015-11-04 MED ORDER — PIOGLITAZONE HCL 30 MG PO TABS: 15.0000 mg | ORAL_TABLET | Freq: Every day | ORAL | Status: DC

## 2015-11-04 MED ORDER — ACETAMINOPHEN 650 MG RE SUPP: 650.0000 mg | Freq: Four times a day (QID) | RECTAL | Status: DC | PRN

## 2015-11-04 MED ORDER — FENOFIBRATE 160 MG PO TABS
160.0000 mg | ORAL_TABLET | Freq: Every evening | ORAL | Status: DC
  Administered 2015-11-04: 160 mg via ORAL
  Filled 2015-11-04: qty 1

## 2015-11-04 MED ORDER — RANOLAZINE ER 500 MG PO TB12: 500.0000 mg | ORAL_TABLET | Freq: Two times a day (BID) | ORAL | Status: DC

## 2015-11-04 MED ORDER — ASPIRIN EC 81 MG PO TBEC: 81.0000 mg | DELAYED_RELEASE_TABLET | Freq: Every day | ORAL | Status: DC

## 2015-11-04 MED ORDER — SODIUM CHLORIDE 0.9% FLUSH: 3.0000 mL | INTRAVENOUS | Status: DC | PRN

## 2015-11-04 MED ORDER — PANTOPRAZOLE SODIUM 40 MG PO TBEC: 40.0000 mg | DELAYED_RELEASE_TABLET | Freq: Every day | ORAL | Status: DC

## 2015-11-04 MED ORDER — CLOPIDOGREL BISULFATE 75 MG PO TABS
75.0000 mg | ORAL_TABLET | Freq: Every day | ORAL | Status: DC
  Administered 2015-11-04: 75 mg via ORAL
  Filled 2015-11-04: qty 1

## 2015-11-04 MED ORDER — MORPHINE SULFATE (PF) 2 MG/ML IV SOLN
2.0000 mg | INTRAVENOUS | Status: DC | PRN
  Administered 2015-11-04 – 2015-11-05 (×3): 2 mg via INTRAVENOUS
  Filled 2015-11-04 (×2): qty 1

## 2015-11-04 MED ORDER — LORAZEPAM 2 MG/ML IJ SOLN: 1.0000 mg | Freq: Once | INTRAMUSCULAR | Status: DC

## 2015-11-04 MED ORDER — DOCUSATE SODIUM 100 MG PO CAPS
100.0000 mg | ORAL_CAPSULE | Freq: Two times a day (BID) | ORAL | Status: DC
Start: 2015-11-04 — End: 2015-11-05
  Filled 2015-11-04: qty 1

## 2015-11-04 MED ORDER — ATORVASTATIN CALCIUM 20 MG PO TABS
20.0000 mg | ORAL_TABLET | Freq: Every evening | ORAL | Status: DC
  Administered 2015-11-04: 20 mg via ORAL
  Filled 2015-11-04: qty 1

## 2015-11-04 MED ORDER — ONDANSETRON HCL 4 MG/2ML IJ SOLN
4.0000 mg | INTRAMUSCULAR | Status: DC | PRN
  Administered 2015-11-04: 4 mg via INTRAVENOUS
  Filled 2015-11-04: qty 2

## 2015-11-04 MED ORDER — HEPARIN BOLUS VIA INFUSION
4000.0000 [IU] | Freq: Once | INTRAVENOUS | Status: AC
  Administered 2015-11-04: 4000 [IU] via INTRAVENOUS
  Filled 2015-11-04: qty 4000

## 2015-11-04 MED ORDER — ONDANSETRON HCL 4 MG/2ML IJ SOLN
4.0000 mg | Freq: Four times a day (QID) | INTRAMUSCULAR | Status: DC
  Administered 2015-11-04: 4 mg via INTRAVENOUS
  Filled 2015-11-04 (×3): qty 2

## 2015-11-04 MED ORDER — ASPIRIN EC 81 MG PO TBEC
81.0000 mg | DELAYED_RELEASE_TABLET | Freq: Every day | ORAL | Status: DC
  Administered 2015-11-05: 81 mg via ORAL
  Filled 2015-11-04: qty 1

## 2015-11-04 MED ORDER — PANTOPRAZOLE SODIUM 40 MG IV SOLR
40.0000 mg | Freq: Two times a day (BID) | INTRAVENOUS | Status: DC
  Administered 2015-11-04 (×2): 40 mg via INTRAVENOUS
  Filled 2015-11-04 (×2): qty 40

## 2015-11-04 MED ORDER — HYDROMORPHONE HCL 1 MG/ML IJ SOLN
INTRAMUSCULAR | Status: AC
  Administered 2015-11-04: 1 mg via INTRAVENOUS
  Filled 2015-11-04: qty 1

## 2015-11-04 MED ORDER — RANOLAZINE ER 500 MG PO TB12
1000.0000 mg | ORAL_TABLET | Freq: Two times a day (BID) | ORAL | Status: DC
  Administered 2015-11-04 (×2): 1000 mg via ORAL
  Filled 2015-11-04 (×2): qty 2

## 2015-11-04 MED ORDER — HEPARIN BOLUS VIA INFUSION
3100.0000 [IU] | Freq: Once | INTRAVENOUS | Status: AC
  Administered 2015-11-04: 3100 [IU] via INTRAVENOUS
  Filled 2015-11-04: qty 3100

## 2015-11-04 MED ORDER — ISOSORBIDE MONONITRATE ER 60 MG PO TB24
60.0000 mg | ORAL_TABLET | Freq: Two times a day (BID) | ORAL | Status: DC
  Administered 2015-11-04: 60 mg via ORAL
  Filled 2015-11-04: qty 1

## 2015-11-04 MED ORDER — HYDROMORPHONE HCL 1 MG/ML IJ SOLN
1.0000 mg | Freq: Once | INTRAMUSCULAR | Status: AC
Start: 2015-11-04 — End: 2015-11-04
  Administered 2015-11-04: 1 mg via INTRAVENOUS

## 2015-11-04 MED ORDER — ACETAMINOPHEN 325 MG PO TABS: 650.0000 mg | ORAL_TABLET | Freq: Four times a day (QID) | ORAL | Status: DC | PRN

## 2015-11-04 MED ORDER — ONDANSETRON HCL 4 MG/2ML IJ SOLN
4.0000 mg | Freq: Once | INTRAMUSCULAR | Status: AC
  Administered 2015-11-04: 4 mg via INTRAVENOUS
  Filled 2015-11-04: qty 2

## 2015-11-04 MED ORDER — SODIUM CHLORIDE 0.9 % WEIGHT BASED INFUSION
3.0000 mL/kg/h | INTRAVENOUS | Status: AC
  Administered 2015-11-05: 3 mL/kg/h via INTRAVENOUS

## 2015-11-04 MED ORDER — HYDROCODONE-ACETAMINOPHEN 5-325 MG PO TABS: 1.0000 | ORAL_TABLET | Freq: Four times a day (QID) | ORAL | Status: DC | PRN

## 2015-11-04 MED ORDER — SODIUM CHLORIDE 0.9 % WEIGHT BASED INFUSION
1.0000 mL/kg/h | INTRAVENOUS | Status: DC
Start: 2015-11-05 — End: 2015-11-05
  Administered 2015-11-05: 1 mL/kg/h via INTRAVENOUS

## 2015-11-04 MED ORDER — ALLOPURINOL 100 MG PO TABS: 100.0000 mg | ORAL_TABLET | Freq: Every day | ORAL | Status: DC

## 2015-11-04 MED ORDER — ASPIRIN EC 81 MG PO TBEC
324.0000 mg | DELAYED_RELEASE_TABLET | Freq: Once | ORAL | Status: AC
  Administered 2015-11-04: 324 mg via ORAL

## 2015-11-04 MED ORDER — HEPARIN (PORCINE) IN NACL 100-0.45 UNIT/ML-% IJ SOLN
1750.0000 [IU]/h | INTRAMUSCULAR | Status: DC
  Administered 2015-11-04: 1350 [IU]/h via INTRAVENOUS
  Filled 2015-11-04 (×4): qty 250

## 2015-11-04 MED ORDER — NITROGLYCERIN 2 % TD OINT
1.0000 [in_us] | TOPICAL_OINTMENT | Freq: Once | TRANSDERMAL | Status: AC
  Administered 2015-11-04: 1 [in_us] via TOPICAL
  Filled 2015-11-04: qty 1

## 2015-11-04 MED ORDER — ASPIRIN EC 81 MG PO TBEC
81.0000 mg | DELAYED_RELEASE_TABLET | Freq: Once | ORAL | Status: DC
  Filled 2015-11-04: qty 1

## 2015-11-04 MED ORDER — NITROGLYCERIN 2 % TD OINT
1.0000 [in_us] | TOPICAL_OINTMENT | Freq: Four times a day (QID) | TRANSDERMAL | Status: DC
  Administered 2015-11-04 – 2015-11-05 (×4): 1 [in_us] via TOPICAL
  Filled 2015-11-04 (×4): qty 1

## 2015-11-04 MED ORDER — METOPROLOL TARTRATE 25 MG PO TABS
25.0000 mg | ORAL_TABLET | Freq: Two times a day (BID) | ORAL | Status: DC
  Administered 2015-11-04: 25 mg via ORAL
  Filled 2015-11-04: qty 1

## 2015-11-04 MED ORDER — ASPIRIN 81 MG PO CHEW: 81.0000 mg | CHEWABLE_TABLET | ORAL | Status: DC

## 2015-11-04 MED ORDER — HYDROMORPHONE HCL 1 MG/ML IJ SOLN
1.0000 mg | Freq: Once | INTRAMUSCULAR | Status: AC
  Administered 2015-11-04: 1 mg via INTRAVENOUS
  Filled 2015-11-04: qty 1

## 2015-11-04 MED ORDER — SODIUM CHLORIDE 0.9 % IV SOLN: 250.0000 mL | INTRAVENOUS | Status: DC | PRN

## 2015-11-04 NOTE — H&P (Signed)
[facility] [place] [facility]   PATIENT NAME: Joshua Larsen    MR#:  [id number]  DATE OF BIRTH:  1950-12-23  DATE OF ADMISSION:  11/04/2015  PRIMARY CARE PHYSICIAN: Megan Mans, MD   REQUESTING/REFERRING PHYSICIAN: Dr. Lacretia Nicks  CHIEF COMPLAINT:   Chief Complaint  Patient presents with  . Chest Pain    HISTORY OF PRESENT ILLNESS:  Joshua Larsen  is a 65 y.o. 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 Patient's last PCI was last year and since then patient has had chronic angina for which Ranexa was started. That improved his symptoms up until a week ago when he started having chest pains on exertion. He has been taking his nitroglycerin as needed and that was relieving him. Since last night his pains were so worse that he took almost 10 nitroglycerin tablets without improvement and presented to the emergency room. He started on heparin drip now. Seen by cardiologist who is recommending cardiac catheter tomorrow. Patient still complains of ongoing chest pain and also nausea.  PAST MEDICAL HISTORY:   Past Medical History  Diagnosis Date  . Hypertension   . Myocardial infarction (HCC)     1 stent, CABG  . GERD (gastroesophageal reflux disease)   . Chronic kidney disease   . Sleep apnea     C-Pap  . Chronic stable angina (HCC)   . Diet-controlled diabetes mellitus (HCC)     PAST SURGICAL HISTORY:   Past Surgical History  Procedure Laterality Date  . Cardiac catheterization      [place] [facility]  . Back surgery    . Replacement total knee Bilateral   . Coronary artery bypass graft      2000  . Wrist surgery Right     SOCIAL HISTORY:   Social History  Substance Use Topics  . Smoking status: Never Smoker   . Smokeless tobacco: Never Used  . Alcohol Use:  No    FAMILY HISTORY:   Family History  Problem Relation Age of Onset  . Liver cancer Mother   . CAD Mother   . Arthritis Mother   . Heart attack Father   . CVA Father   . CAD Father   . Dementia Sister     DRUG ALLERGIES:   Allergies  Allergen Reactions  . Amoxicillin   . Codeine     GI side effects  . Tagamet  [Cimetidine] Other (See Comments)    Causes changes in mental status Causes changes in mental status    REVIEW OF SYSTEMS:   Review of Systems  Constitutional: Positive for malaise/fatigue. Negative for fever, chills and weight loss.  HENT: Negative for ear discharge, ear pain, hearing loss, nosebleeds and tinnitus.   Eyes: Negative for blurred vision, double vision and photophobia.  Respiratory: Positive for shortness of breath. Negative for cough, hemoptysis and wheezing.   Cardiovascular: Positive for chest pain. Negative for palpitations, orthopnea and leg swelling.  Gastrointestinal: Positive for heartburn, nausea and vomiting. Negative for abdominal pain, diarrhea, constipation and melena.  Genitourinary: Negative for dysuria, urgency, frequency and hematuria.  Musculoskeletal: Negative for myalgias, back pain and neck pain.  Skin: Negative for rash.  Neurological: Negative for dizziness, tingling, tremors, sensory change, speech change, focal weakness and headaches.  Endo/Heme/Allergies: Does not bruise/bleed easily.  Psychiatric/Behavioral: Negative for depression. The patient is nervous/anxious.     MEDICATIONS AT HOME:   Prior to Admission medications   Medication Sig Start Date End Date Taking? Authorizing Provider  allopurinol (ZYLOPRIM) 100 MG tablet Take 1 tablet (100 mg total) by mouth daily. 02/12/15  Yes Richard Hulen Shouts., MD  aspirin EC 81 MG tablet Take 81 mg by mouth daily.   Yes Historical Provider, MD  atorvastatin (LIPITOR) 20 MG tablet Take 20 mg by mouth every evening.    Yes Historical Provider, MD  clopidogrel (PLAVIX) 75 MG  tablet Take 75 mg by mouth daily.    Yes Historical Provider, MD  esomeprazole (NEXIUM) 40 MG capsule Take 1 capsule by mouth daily as needed (for acid reflux).    Yes Historical Provider, MD  fenofibrate 160 MG tablet Take 1 tablet (160 mg total) by mouth daily. 05/14/15  Yes Richard Hulen Shouts., MD  hydrochlorothiazide (HYDRODIURIL) 25 MG tablet Take 1 tablet by mouth daily. 09/29/15 09/28/16 Yes Historical Provider, MD  isosorbide mononitrate (IMDUR) 60 MG 24 hr tablet Take 60 mg by mouth 2 (two) times daily.   Yes Historical Provider, MD  metoprolol tartrate (LOPRESSOR) 25 MG tablet Take 1 tablet (25 mg total) by mouth 2 (two) times daily. 06/20/15  Yes Katharina Caper, MD  nitroGLYCERIN (NITROSTAT) 0.4 MG SL tablet Place 0.4 mg under the tongue every 5 (five) minutes as needed for chest pain.    Yes Historical Provider, MD  ranolazine (RANEXA) 500 MG 12 hr tablet Take 1 tablet (500 mg total) by mouth 2 (two) times daily. 06/20/15  Yes Katharina Caper, MD  valsartan (DIOVAN) 160 MG tablet Take 1 tablet by mouth daily. 09/29/15 09/28/16 Yes Historical Provider, MD      VITAL SIGNS:  Blood pressure 108/85, pulse 61, temperature 98 F (36.7 C), temperature source Oral, resp. rate 18, height 5\' 9"  (1.753 m), weight 140.751 kg (310 lb 4.8 oz), SpO2 96 %.  PHYSICAL EXAMINATION:   Physical Exam  GENERAL:  65 y.o.-year-old obese patient lying in the bed and appears to be in distress due to nausea.Marland Kitchen  EYES: Pupils equal, round, reactive to light and accommodation. No scleral icterus. Extraocular muscles intact.  HEENT: Head atraumatic, normocephalic. Oropharynx and nasopharynx clear.  NECK:  Supple, no jugular venous distention. No thyroid enlargement, no tenderness.  LUNGS: Normal breath sounds bilaterally, no wheezing, rales,rhonchi or crepitation. No use of accessory muscles of respiration.  CARDIOVASCULAR: S1, S2 normal. No murmurs, rubs, or gallops.  ABDOMEN: Soft, nontender, nondistended. Bowel sounds  present. No organomegaly or mass.  EXTREMITIES: No pedal edema, cyanosis, or clubbing.  NEUROLOGIC: Cranial nerves II through XII are intact. Muscle strength 5/5 in all extremities. Sensation intact. Gait not checked.  PSYCHIATRIC: The patient is alert and oriented x 3.  SKIN: No obvious rash, lesion, or ulcer.   LABORATORY PANEL:   CBC  Recent Labs Lab 11/04/15 0803  WBC 8.5  HGB 12.8*  HCT 39.0*  PLT 167   ------------------------------------------------------------------------------------------------------------------  Chemistries   Recent Labs Lab 11/04/15 0803  NA 135  K 4.5  CL 106  CO2 22  GLUCOSE 126*  BUN 30*  CREATININE 1.87*  CALCIUM 9.1   ------------------------------------------------------------------------------------------------------------------  Cardiac Enzymes  Recent Labs Lab 11/04/15 0936  TROPONINI 0.10*   ------------------------------------------------------------------------------------------------------------------  RADIOLOGY:  Dg Chest 2 View  11/04/2015  CLINICAL DATA:  Left-sided chest pain beginning last night. History of prior myocardial infarction. Initial encounter. EXAM: CHEST  2 VIEW  COMPARISON:  PA and lateral chest 06/18/2015.  CT chest 09/16/2014. FINDINGS: The patient is status post CABG. Heart size is upper normal. There is no pneumothorax or pleural effusion. No focal airspace disease is identified. Multilevel thoracic spondylosis is seen. Degenerative change is present about the shoulders. IMPRESSION: No acute disease. Electronically Signed   By: Drusilla Kanner M.D.   On: 11/04/2015 08:49    EKG:   Orders placed or performed during the hospital encounter of 11/04/15  . EKG 12-Lead  . EKG 12-Lead  . ED EKG within 10 minutes  . ED EKG within 10 minutes    IMPRESSION AND PLAN:   Joshua Larsen  is a 65 y.o. male with a known history of hypertension, CK D, sleep apnea, CAD status post CABG and a stent put in with  chronic stable angina presents to the hospital secondary to ongoing chest pain that got worse since yesterday.  #1 unstable angina-admit to telemetry. Ongoing chest pain. Continue nitroglycerin patch. -On heparin drip. Marginally elevated troponin however that is chronic for the patient. -Check echocardiogram. -Increased Ranexa 2000 mg twice a day. For cardiac catheterization tomorrow. -Continue aspirin, Plavix, statin and metoprolol. -If pain doesn't improve., We'll change to nitro drip.  #2 CAD status post CABG-follows with Dr. Lady Gary -On aspirin and Plavix -Continue statin, metoprolol and Imdur.  cardiac catheterization was almost a year ago when he had the stent put in. -Cardiology consult. Follow-up troponins. Check echocardiogram  #3 hypertension-continue home medications. He is on metoprolol, Imdur  #4 diabetes mellitus-diet-controlled at home. Check A1c  #5 GERD-changed to IV Protonix with his nausea  #6 DVT prophylaxis-currently on heparin drip    All the records are reviewed and case discussed with ED provider. Management plans discussed with the patient, family and they are in agreement.  CODE STATUS: Full code  TOTAL TIME TAKING CARE OF THIS PATIENT: 50 minutes.    Enid Baas M.D on 11/04/2015 at 3:16 PM  Between 7am to 6pm - Pager - [phone]  After 6pm go to [email or web address] - password [email or web address] [facility]  [facility] [place] [facility]  Office  [phone]  CC: Primary care physician; Megan Mans, MD

## 2015-11-04 NOTE — ED Notes (Signed)
Pt presents with substernal chest pain that radiates through to back and up neck.  Denies SOB, Denies nausea.  Pt reports onset of chest pain about midnight last night.  PMH includes bypass and 1 stent.  Pt states that he took 10 nitro tabs without relief.

## 2015-11-04 NOTE — Progress Notes (Signed)
ANTICOAGULATION CONSULT NOTE - Initial Consult  Pharmacy Consult for heparin Indication: chest pain/ACS  Allergies  Allergen Reactions  . Amoxicillin   . Codeine     GI side effects  . Tagamet  [Cimetidine] Other (See Comments)    Causes changes in mental status Causes changes in mental status    Patient Measurements: Height: 5\' 9"  (175.3 cm) Weight: (!) 310 lb 4.8 oz (140.751 kg) IBW/kg (Calculated) : 70.7 Heparin Dosing Weight: 104kg  Vital Signs: Temp: 98 F (36.7 C) (03/15 1112) Temp Source: Oral (03/15 0750) BP: 108/85 mmHg (03/15 1112) Pulse Rate: 61 (03/15 1112)  Labs:  Recent Labs  11/04/15 0802 11/04/15 0803 11/04/15 0936  HGB  --  12.8*  --   HCT  --  39.0*  --   PLT  --  167  --   APTT 34  --   --   LABPROT 14.6  --   --   INR 1.12  --   --   CREATININE  --  1.87*  --   TROPONINI  --  0.09* 0.10*    Estimated Creatinine Clearance: 55.7 mL/min (by C-G formula based on Cr of 1.87).  Assessment: Pharmacy consulted to dose heparin for ACS in this 65 year old male. Not on anticoagulation PTA, baseline labs ordered  Goal of Therapy:  Heparin level 0.3-0.7   Plan:  Give 4000 units bolus x 1 Start heparin infusion at 1350 units/hr Check anti-Xa level in 6 hours and daily while on heparin Continue to monitor H&H and platelets  Tomma Ehinger C 11/04/2015,11:26 AM

## 2015-11-04 NOTE — Progress Notes (Signed)
Troponin elevated to 1.33, patient on heparin gtt, VSS.    Cath planned for the AM, consult signed.  Continue to monitor.

## 2015-11-04 NOTE — Progress Notes (Addendum)
Put nitro paste on left upper arm.  Started on heparin gtt.  Pain 8/10.  Patient does not want any additional interventions for pain at this time.  Acting very anxious.  Spoke to Dr. Nemiah Commander and got order for Ativan but patient refuses medication.  Dr. Darrold Junker to consult on patient.

## 2015-11-04 NOTE — ED Notes (Signed)
Pt here for chest pain that started last night.  Has taken 10 nitro tabs without relief.  PMH includes MI with bypass and stent placement.

## 2015-11-04 NOTE — Consult Note (Signed)
[facility] Cardiology  CARDIOLOGY CONSULT NOTE  Patient ID: Joshua Larsen MRN: [id number] DOB/AGE: 22-Nov-1950 65 y.o.  Admit date: 11/04/2015 Referring Physician Nemiah Commander Primary Physician Sullivan Lone Primary Cardiologist Fath Reason for Consultation chest pain  HPI: 65 year old gentleman referred for evaluation of chest pain. The patient has a history of CABG 5 2001. He underwent cardiac catheterization 08/07/2014 which revealed occluded left main, diffusely diseased RCA, occluded SVG sequential to distal RCA and PDA, occluded SVG to D1, patent LIMA to LAD, and high-grade stenosis of SVG to OM1. The patient underwent successful PCI with DES of SVG to OM1. The patient apparently did well for a while, but has had a several week history of recurring episodes of mid back pain which is his anginal equivalent. The patient reports he typically takes a sublingual nitroglycerin with relief. Earlier this week, the patient more frequent episodes of mid back pain and last night had an episode that was refractory to multiple sublingual nitroglycerin. He presented to [facility] emergency room where ECG revealed incomplete left bundle-branch block. She'll labs were notable for borderline elevated troponin of 0.09.  Review of systems complete and found to be negative unless listed above     Past Medical History  Diagnosis Date  . Hypertension   . Myocardial infarction (HCC)     1 stent  . GERD (gastroesophageal reflux disease)   . Chronic kidney disease   . Sleep apnea     C-Pap    Past Surgical History  Procedure Laterality Date  . Cardiac catheterization    . Back surgery    . Replacement total knee Left   . Coronary artery bypass graft    . Wrist surgery Right     Prescriptions prior to admission  Medication Sig Dispense Refill Last Dose  . allopurinol (ZYLOPRIM) 100 MG tablet Take 1 tablet (100 mg total) by mouth daily. 30 tablet 12 11/03/2015 at Unknown time  . aspirin EC 81 MG tablet Take 81 mg by  mouth daily.   11/03/2015 at 2300  . atorvastatin (LIPITOR) 20 MG tablet Take 20 mg by mouth every evening.    11/03/2015 at Unknown time  . clopidogrel (PLAVIX) 75 MG tablet Take 75 mg by mouth daily.    11/03/2015 at 2300  . esomeprazole (NEXIUM) 40 MG capsule Take 1 capsule by mouth daily as needed (for acid reflux).    11/03/2015 at Unknown time  . fenofibrate 160 MG tablet Take 1 tablet (160 mg total) by mouth daily. 30 tablet 12 11/03/2015 at Unknown time  . hydrochlorothiazide (HYDRODIURIL) 25 MG tablet Take 1 tablet by mouth daily.   11/03/2015 at Unknown time  . isosorbide mononitrate (IMDUR) 60 MG 24 hr tablet Take 60 mg by mouth 2 (two) times daily.  5 11/03/2015 at Unknown time  . metoprolol tartrate (LOPRESSOR) 25 MG tablet Take 1 tablet (25 mg total) by mouth 2 (two) times daily. 60 tablet 6 11/03/2015 at Unknown time  . nitroGLYCERIN (NITROSTAT) 0.4 MG SL tablet Place 0.4 mg under the tongue every 5 (five) minutes as needed for chest pain.    11/04/2015 at Unknown time  . ranolazine (RANEXA) 500 MG 12 hr tablet Take 1 tablet (500 mg total) by mouth 2 (two) times daily. 60 tablet 6 11/03/2015 at Unknown time  . valsartan (DIOVAN) 160 MG tablet Take 1 tablet by mouth daily.   11/03/2015 at Unknown time   Social History   Social History  . Marital Status: Divorced    Spouse Name:  divorced  . Number of Children: 1  . Years of Education: 14   Occupational History  . retired    Social History Main Topics  . Smoking status: Never Smoker   . Smokeless tobacco: Never Used  . Alcohol Use: No  . Drug Use: No  . Sexual Activity: No   Other Topics Concern  . Not on file   Social History Narrative    Family History  Problem Relation Age of Onset  . Liver cancer Mother   . CAD Mother   . Arthritis Mother   . Heart attack Father   . CVA Father   . CAD Father   . Dementia Sister       Review of systems complete and found to be negative unless listed above      PHYSICAL  EXAM  General: Well developed, well nourished, in no acute distress HEENT:  Normocephalic and atramatic Neck:  No JVD.  Lungs: Clear bilaterally to auscultation and percussion. Heart: HRRR . Normal S1 and S2 without gallops or murmurs.  Abdomen: Bowel sounds are positive, abdomen soft and non-tender  Msk:  Back normal, normal gait. Normal strength and tone for age. Extremities: No clubbing, cyanosis or edema.   Neuro: Alert and oriented X 3. Psych:  Good affect, responds appropriately  Labs:   Lab Results  Component Value Date   WBC 8.5 11/04/2015   HGB 12.8* 11/04/2015   HCT 39.0* 11/04/2015   MCV 77.5* 11/04/2015   PLT 167 11/04/2015    Recent Labs Lab 11/04/15 0803  NA 135  K 4.5  CL 106  CO2 22  BUN 30*  CREATININE 1.87*  CALCIUM 9.1  GLUCOSE 126*   Lab Results  Component Value Date   CKTOTAL 2443* 10/09/2013   CKMB 59.9* 09/16/2014   TROPONINI 0.10* 11/04/2015    Lab Results  Component Value Date   CHOL 115 06/08/2015   CHOL 97 11/08/2013   CHOL 115 10/10/2013   Lab Results  Component Value Date   HDL 18* 06/08/2015   HDL 22* 11/08/2013   HDL 15* 10/10/2013   Lab Results  Component Value Date   LDLCALC 63 06/08/2015   LDLCALC 48 11/08/2013   LDLCALC 45 10/10/2013   Lab Results  Component Value Date   TRIG 168* 06/08/2015   TRIG 133 11/08/2013   TRIG 275* 10/10/2013   No results found for: CHOLHDL No results found for: LDLDIRECT    Radiology: Dg Chest 2 View  11/04/2015  CLINICAL DATA:  Left-sided chest pain beginning last night. History of prior myocardial infarction. Initial encounter. EXAM: CHEST  2 VIEW COMPARISON:  PA and lateral chest 06/18/2015.  CT chest 09/16/2014. FINDINGS: The patient is status post CABG. Heart size is upper normal. There is no pneumothorax or pleural effusion. No focal airspace disease is identified. Multilevel thoracic spondylosis is seen. Degenerative change is present about the shoulders. IMPRESSION: No acute  disease. Electronically Signed   By: Drusilla Kanner M.D.   On: 11/04/2015 08:49    EKG: Incomplete left bundle-branch block  ASSESSMENT AND PLAN:   1. Unstable angina versus non-STEMI, in patient with known coronary artery bypass graft surgery, with diffuse native disease, and history of stent of SVG to OM1 with nondiagnostic ECG, and borderline elevated troponin  Recommendations  1. Agree with current therapy 2.  Proceed with cardiac catheterization with selective coronary arteriography scheduled for 11/05/2015. The risks, benefits alternatives were explained to the patient and informed written consent was  obtained.   SignedMarcina Millard MD,PhD, [facility] 11/04/2015, 12:50 PM

## 2015-11-04 NOTE — ED Provider Notes (Signed)
Time Seen: Approximately ----------------------------------------- 8:03 AM on 11/04/2015 -----------------------------------------    I have reviewed the triage notes  Chief Complaint: Chest Pain   History of Present Illness: Joshua Larsen is a 65 y.o. male who has a long history of cardiovascular disease with previous 5 vessel bypass in 1 single stent placement. Patient states he has a non-instrumental coronary lesion. He states he started developing some intermittent chest pain yesterday and has taken a total of 10 nitroglycerin since last evening and overnight without symptomatic relief. States his last nitroglycerin was approximately an hour ago. Patient states this pain feels like his previous cardiovascular pain. He is any radiation to the arm or jaw. Most of his pain is posterior left sided. He denies much in way of nausea, vomiting. He denies any significant shortness of breath or pleuritic component. He states he has a chronic dry nonproductive cough without change. He denies any pain exacerbated by movement. No diaphoresis.  Past Medical History  Diagnosis Date  . Hypertension   . Myocardial infarction (HCC)     1 stent  . GERD (gastroesophageal reflux disease)   . Chronic kidney disease   . Sleep apnea     C-Pap    Patient Active Problem List   Diagnosis Date Noted  . Dizziness and giddiness 06/20/2015  . Symptomatic sinus bradycardia 06/20/2015  . HTN (hypertension) 06/20/2015  . Diabetes (HCC) 06/20/2015  . CKD stage 3 secondary to diabetes (HCC) 06/20/2015  . OSA (obstructive sleep apnea) 06/20/2015  . Elevated troponin 06/18/2015  . Type 2 diabetes mellitus (HCC) 06/10/2015  . Alopecia 02/11/2015  . Adaptation reaction 02/11/2015  . Acute MI (HCC) 02/11/2015  . Arteriosclerosis of coronary artery 02/11/2015  . Clinical depression 02/11/2015  . Diabetes mellitus, type 2 (HCC) 02/11/2015  . Acid reflux 02/11/2015  . Gout 02/11/2015  . Hepatitis A  02/11/2015  . Asthma 02/11/2015  . HLD (hyperlipidemia) 02/11/2015  . BP (high blood pressure) 02/11/2015  . Cannot sleep 02/11/2015  . Arthritis, degenerative 02/11/2015  . Adiposity 02/11/2015  . Acute kidney failure (HCC) 02/11/2015  . Apnea, sleep 02/11/2015  . Calculus of kidney 12/25/2013  . CAD in native artery 12/20/2013    Past Surgical History  Procedure Laterality Date  . Cardiac catheterization    . Back surgery    . Replacement total knee Left   . Coronary artery bypass graft    . Wrist surgery Right     Past Surgical History  Procedure Laterality Date  . Cardiac catheterization    . Back surgery    . Replacement total knee Left   . Coronary artery bypass graft    . Wrist surgery Right     Current Outpatient Rx  Name  Route  Sig  Dispense  Refill  . allopurinol (ZYLOPRIM) 100 MG tablet   Oral   Take 1 tablet (100 mg total) by mouth daily.   30 tablet   12   . aspirin EC 81 MG tablet   Oral   Take 81 mg by mouth daily.         Marland Kitchen atorvastatin (LIPITOR) 20 MG tablet   Oral   Take 20 mg by mouth every evening.          . clopidogrel (PLAVIX) 75 MG tablet   Oral   Take 75 mg by mouth daily.          Marland Kitchen esomeprazole (NEXIUM) 40 MG capsule   Oral   Take 1  capsule by mouth daily as needed (for acid reflux).          . fenofibrate 160 MG tablet   Oral   Take 1 tablet (160 mg total) by mouth daily. Patient taking differently: Take 160 mg by mouth every evening.    30 tablet   12   . glucose blood (FREESTYLE TEST STRIPS) test strip      Check sugar once daily   100 each   12     DX: E11.9   . HYDROcodone-acetaminophen (NORCO/VICODIN) 5-325 MG per tablet   Oral   Take 1 tablet by mouth every 6 (six) hours as needed for moderate pain or severe pain.          . isosorbide mononitrate (IMDUR) 60 MG 24 hr tablet   Oral   Take 60 mg by mouth 2 (two) times daily.      5   . meclizine (ANTIVERT) 25 MG tablet   Oral   Take 1 tablet  (25 mg total) by mouth 3 (three) times daily.   60 tablet   3   . metoprolol tartrate (LOPRESSOR) 25 MG tablet   Oral   Take 1 tablet (25 mg total) by mouth 2 (two) times daily.   60 tablet   6   . nitroGLYCERIN (NITROSTAT) 0.4 MG SL tablet   Sublingual   Place 0.4 mg under the tongue every 5 (five) minutes as needed for chest pain.          . pioglitazone (ACTOS) 15 MG tablet   Oral   Take 1 tablet (15 mg total) by mouth daily.   30 tablet   12   . ranolazine (RANEXA) 500 MG 12 hr tablet   Oral   Take 1 tablet (500 mg total) by mouth 2 (two) times daily.   60 tablet   6   . valsartan-hydrochlorothiazide (DIOVAN-HCT) 160-25 MG tablet   Oral   Take 1 tablet by mouth daily.           Allergies:  Amoxicillin; Codeine; and Tagamet   Family History: Family History  Problem Relation Age of Onset  . Liver cancer Mother   . CAD Mother   . Arthritis Mother   . Heart attack Father   . CVA Father   . CAD Father   . Dementia Sister     Social History: Social History  Substance Use Topics  . Smoking status: Never Smoker   . Smokeless tobacco: Never Used  . Alcohol Use: No     Review of Systems:   10 point review of systems was performed and was otherwise negative:  Constitutional: No fever Eyes: No visual disturbances ENT: No sore throat, ear pain Cardiac: Mild anterior chest discomfort Respiratory: No shortness of breath, wheezing, or stridor Abdomen: No abdominal pain, no vomiting, No diarrhea Endocrine: No weight loss, No night sweats Extremities: No new peripheral edema, cyanosis Skin: No rashes, easy bruising Neurologic: No focal weakness, trouble with speech or swollowing Urologic: No dysuria, Hematuria, or urinary frequency   Physical Exam:  ED Triage Vitals  Enc Vitals Group     BP 11/04/15 0750 152/79 mmHg     Pulse Rate 11/04/15 0750 84     Resp 11/04/15 0750 19     Temp 11/04/15 0750 97.3 F (36.3 C)     Temp Source 11/04/15 0750  Oral     SpO2 11/04/15 0750 100 %     Weight 11/04/15 0750 310 lb  4.8 oz (140.751 kg)     Height 11/04/15 0750 5\' 9"  (1.753 m)     Head Cir --      Peak Flow --      Pain Score 11/04/15 0751 9     Pain Loc --      Pain Edu? --      Excl. in GC? --     General: Awake , Alert , and Oriented times 3; GCS 15 Anxious Head: Normal cephalic , atraumatic Eyes: Pupils equal , round, reactive to light Nose/Throat: No nasal drainage, patent upper airway without erythema or exudate.  Neck: Supple, Full range of motion, No anterior adenopathy or palpable thyroid masses Lungs: Clear to ascultation without wheezes , rhonchi, or rales Heart: Regular rate, regular rhythm without murmurs , gallops , or rubs Abdomen: Soft, non tender without rebound, guarding , or rigidity; bowel sounds positive and symmetric in all 4 quadrants. No organomegaly .        Extremities: 2 plus symmetric pulses. No edema, clubbing or cyanosis Neurologic: normal ambulation, Motor symmetric without deficits, sensory intact Skin: warm, dry, no rashes   Labs:   All laboratory work was reviewed including any pertinent negatives or positives listed below:  Labs Reviewed  BASIC METABOLIC PANEL  CBC  TROPONIN I  Laboratory work was reviewed and the patient has a persistent elevated troponin  EKG: * ED ECG REPORT I, Jennye Moccasin, the attending physician, personally viewed and interpreted this ECG.  Date: 11/04/2015 EKG Time: 0746 Rate: 90 Rhythm: normal sinus rhythm QRS Axis: normal Intervals: Left bundle branch block pattern ST/T Wave abnormalities: normal Conduction Disturbances: none Narrative Interpretation: unremarkable Left bundle branch block pattern appears new since last EKG. There is no associated acute ischemic changes with the bundle block   Radiology:    CLINICAL DATA: Left-sided chest pain beginning last night. History of prior myocardial infarction. Initial encounter.  EXAM: CHEST 2  VIEW  COMPARISON: PA and lateral chest 06/18/2015. CT chest 09/16/2014.  FINDINGS: The patient is status post CABG. Heart size is upper normal. There is no pneumothorax or pleural effusion. No focal airspace disease is identified. Multilevel thoracic spondylosis is seen. Degenerative change is present about the shoulders.  IMPRESSION: No acute disease.   Electronically Signed By: Drusilla Kanner M.D. On: 11/04/2015 08:49 I personally reviewed the radiologic studies     ED Course: * Patient's stay here showed symptomatic improvement with nitroglycerin paste and received 2 doses of IV Dilaudid. He does have appears to be a new nonischemic left bundle branch block and review of his EKG. Patient I felt required inpatient observation for acute coronary syndrome. Review of his troponin shows its elevated though the patient has had elevated troponins in the past without acute ischemia  Assessment:  Acute coronary syndrome      Plan:  Inpatient management            Jennye Moccasin, MD 11/04/15 1404

## 2015-11-04 NOTE — Progress Notes (Addendum)
ANTICOAGULATION CONSULT NOTE - Initial Consult  Pharmacy Consult for heparin Indication: chest pain/ACS  Allergies  Allergen Reactions  . Amoxicillin   . Codeine     GI side effects  . Tagamet  [Cimetidine] Other (See Comments)    Causes changes in mental status Causes changes in mental status    Patient Measurements: Height: 5\' 9"  (175.3 cm) Weight: (!) 310 lb 4.8 oz (140.751 kg) IBW/kg (Calculated) : 70.7 Heparin Dosing Weight: 104kg  Vital Signs: Temp: 97.9 F (36.6 C) (03/15 1953) Temp Source: Oral (03/15 1953) BP: 117/57 mmHg (03/15 1953) Pulse Rate: 85 (03/15 1953)  Labs:  Recent Labs  11/04/15 0802 11/04/15 0803 11/04/15 0936 11/04/15 1528 11/04/15 1811  HGB  --  12.8*  --   --   --   HCT  --  39.0*  --   --   --   PLT  --  167  --   --   --   APTT 34  --   --   --   --   LABPROT 14.6  --   --   --   --   INR 1.12  --   --   --   --   HEPARINUNFRC  --   --   --   --  <0.10*  CREATININE  --  1.87*  --   --   --   TROPONINI  --  0.09* 0.10* 1.33*  --     Estimated Creatinine Clearance: 55.7 mL/min (by C-G formula based on Cr of 1.87).  Assessment: Pharmacy consulted to dose heparin for ACS in this 65 year old male. Not on anticoagulation PTA, baseline labs ordered  Goal of Therapy:  Heparin level 0.3-0.7   Plan:  Give 4000 units bolus x 1 Start heparin infusion at 1350 units/hr Check anti-Xa level in 6 hours and daily while on heparin Continue to monitor H&H and platelets   3/15:   HL @ 18:00 = < 0.1 Will order Heparin 3100 units IV X 1 and increase drip rate to 1750 units/hr. Will recheck HL 6 hrs after rate change on 3/16 @ 0400.   Rayhaan Huster D 11/04/2015,9:49 PM

## 2015-11-05 ENCOUNTER — Inpatient Hospital Stay (HOSPITAL_COMMUNITY): Payer: Medicare Other

## 2015-11-05 ENCOUNTER — Inpatient Hospital Stay (HOSPITAL_COMMUNITY)
Admission: AD | Admit: 2015-11-05 | Discharge: 2015-11-15 | DRG: 246 | Disposition: A | Discharge status: Home / Self Care | Payer: Medicare Other | Source: Other Acute Inpatient Hospital | Attending: Cardiovascular Disease | Admitting: Cardiovascular Disease

## 2015-11-05 ENCOUNTER — Inpatient Hospital Stay: Payer: Medicare Other

## 2015-11-05 ENCOUNTER — Encounter
Admission: EM | Disposition: A | Discharge status: Other Acute Inpatient Hospital | Payer: Self-pay | Source: Home / Self Care | Attending: Internal Medicine

## 2015-11-05 ENCOUNTER — Encounter (HOSPITAL_COMMUNITY): Payer: Self-pay | Admitting: Nurse Practitioner

## 2015-11-05 ENCOUNTER — Other Ambulatory Visit: Payer: Self-pay

## 2015-11-05 DIAGNOSIS — Z888 Allergy status to other drugs, medicaments and biological substances status: Secondary | ICD-10-CM | POA: Diagnosis not present

## 2015-11-05 DIAGNOSIS — Z8249 Family history of ischemic heart disease and other diseases of the circulatory system: Secondary | ICD-10-CM | POA: Diagnosis not present

## 2015-11-05 DIAGNOSIS — I5023 Acute on chronic systolic (congestive) heart failure: Secondary | ICD-10-CM | POA: Diagnosis present

## 2015-11-05 DIAGNOSIS — Z8739 Personal history of other diseases of the musculoskeletal system and connective tissue: Secondary | ICD-10-CM

## 2015-11-05 DIAGNOSIS — Z96653 Presence of artificial knee joint, bilateral: Secondary | ICD-10-CM | POA: Diagnosis present

## 2015-11-05 DIAGNOSIS — Z955 Presence of coronary angioplasty implant and graft: Secondary | ICD-10-CM

## 2015-11-05 DIAGNOSIS — Z6841 Body Mass Index (BMI) 40.0 and over, adult: Secondary | ICD-10-CM

## 2015-11-05 DIAGNOSIS — M109 Gout, unspecified: Secondary | ICD-10-CM | POA: Diagnosis present

## 2015-11-05 DIAGNOSIS — I214 Non-ST elevation (NSTEMI) myocardial infarction: Secondary | ICD-10-CM

## 2015-11-05 DIAGNOSIS — J9600 Acute respiratory failure, unspecified whether with hypoxia or hypercapnia: Secondary | ICD-10-CM | POA: Diagnosis present

## 2015-11-05 DIAGNOSIS — M25562 Pain in left knee: Secondary | ICD-10-CM

## 2015-11-05 DIAGNOSIS — G4733 Obstructive sleep apnea (adult) (pediatric): Secondary | ICD-10-CM | POA: Diagnosis present

## 2015-11-05 DIAGNOSIS — Z881 Allergy status to other antibiotic agents status: Secondary | ICD-10-CM

## 2015-11-05 DIAGNOSIS — Z95811 Presence of heart assist device: Secondary | ICD-10-CM | POA: Diagnosis not present

## 2015-11-05 DIAGNOSIS — Z885 Allergy status to narcotic agent status: Secondary | ICD-10-CM

## 2015-11-05 DIAGNOSIS — Z4509 Encounter for adjustment and management of other cardiac device: Secondary | ICD-10-CM | POA: Insufficient documentation

## 2015-11-05 DIAGNOSIS — I2510 Atherosclerotic heart disease of native coronary artery without angina pectoris: Secondary | ICD-10-CM | POA: Diagnosis present

## 2015-11-05 DIAGNOSIS — N183 Chronic kidney disease, stage 3 (moderate): Secondary | ICD-10-CM | POA: Diagnosis present

## 2015-11-05 DIAGNOSIS — K219 Gastro-esophageal reflux disease without esophagitis: Secondary | ICD-10-CM | POA: Diagnosis present

## 2015-11-05 DIAGNOSIS — R570 Cardiogenic shock: Secondary | ICD-10-CM | POA: Diagnosis present

## 2015-11-05 DIAGNOSIS — I25110 Atherosclerotic heart disease of native coronary artery with unstable angina pectoris: Secondary | ICD-10-CM | POA: Diagnosis present

## 2015-11-05 DIAGNOSIS — R0602 Shortness of breath: Secondary | ICD-10-CM

## 2015-11-05 DIAGNOSIS — I25700 Atherosclerosis of coronary artery bypass graft(s), unspecified, with unstable angina pectoris: Secondary | ICD-10-CM | POA: Diagnosis not present

## 2015-11-05 DIAGNOSIS — I97711 Intraoperative cardiac arrest during other surgery: Secondary | ICD-10-CM | POA: Diagnosis present

## 2015-11-05 DIAGNOSIS — I255 Ischemic cardiomyopathy: Secondary | ICD-10-CM | POA: Diagnosis present

## 2015-11-05 DIAGNOSIS — I509 Heart failure, unspecified: Secondary | ICD-10-CM

## 2015-11-05 DIAGNOSIS — N179 Acute kidney failure, unspecified: Secondary | ICD-10-CM | POA: Diagnosis present

## 2015-11-05 DIAGNOSIS — Z951 Presence of aortocoronary bypass graft: Secondary | ICD-10-CM

## 2015-11-05 DIAGNOSIS — Z9889 Other specified postprocedural states: Secondary | ICD-10-CM

## 2015-11-05 DIAGNOSIS — E1122 Type 2 diabetes mellitus with diabetic chronic kidney disease: Secondary | ICD-10-CM | POA: Diagnosis present

## 2015-11-05 DIAGNOSIS — I119 Hypertensive heart disease without heart failure: Secondary | ICD-10-CM | POA: Diagnosis present

## 2015-11-05 DIAGNOSIS — I249 Acute ischemic heart disease, unspecified: Secondary | ICD-10-CM | POA: Diagnosis not present

## 2015-11-05 DIAGNOSIS — IMO0002 Reserved for concepts with insufficient information to code with codable children: Secondary | ICD-10-CM

## 2015-11-05 DIAGNOSIS — I130 Hypertensive heart and chronic kidney disease with heart failure and stage 1 through stage 4 chronic kidney disease, or unspecified chronic kidney disease: Secondary | ICD-10-CM | POA: Diagnosis present

## 2015-11-05 HISTORY — DX: Chronic kidney disease, stage 3 (moderate): N18.3

## 2015-11-05 HISTORY — DX: Atherosclerotic heart disease of native coronary artery without angina pectoris: I25.10

## 2015-11-05 HISTORY — DX: Hypertensive heart disease without heart failure: I11.9

## 2015-11-05 HISTORY — DX: Chronic kidney disease, stage 3 unspecified: N18.30

## 2015-11-05 HISTORY — PX: CARDIAC CATHETERIZATION: SHX172

## 2015-11-05 LAB — CBC
HEMATOCRIT: 35.6 % — AB (ref 40.0–52.0)
Hemoglobin: 11.6 g/dL — ABNORMAL LOW (ref 13.0–18.0)
MCH: 25.7 pg — ABNORMAL LOW (ref 26.0–34.0)
MCHC: 32.5 g/dL (ref 32.0–36.0)
MCV: 79.1 fL — AB (ref 80.0–100.0)
PLATELETS: 157 10*3/uL (ref 150–440)
RBC: 4.5 MIL/uL (ref 4.40–5.90)
RDW: 17.5 % — ABNORMAL HIGH (ref 11.5–14.5)
WBC: 9.6 10*3/uL (ref 3.8–10.6)

## 2015-11-05 LAB — BASIC METABOLIC PANEL
Anion gap: 7 (ref 5–15)
BUN: 32 mg/dL — ABNORMAL HIGH (ref 6–20)
CHLORIDE: 107 mmol/L (ref 101–111)
CO2: 24 mmol/L (ref 22–32)
Calcium: 8.9 mg/dL (ref 8.9–10.3)
Creatinine, Ser: 1.92 mg/dL — ABNORMAL HIGH (ref 0.61–1.24)
GFR, EST AFRICAN AMERICAN: 41 mL/min — AB (ref >60)
GFR, EST NON AFRICAN AMERICAN: 35 mL/min — AB (ref >60)
Glucose, Bld: 122 mg/dL — ABNORMAL HIGH (ref 65–99)
POTASSIUM: 4.8 mmol/L (ref 3.5–5.1)
SODIUM: 138 mmol/L (ref 135–145)

## 2015-11-05 LAB — CK TOTAL AND CKMB (NOT AT ARMC)
CK, MB: 86.4 ng/mL — ABNORMAL HIGH (ref 0.5–5.0)
Relative Index: 12.1 — ABNORMAL HIGH (ref 0.0–2.5)
Total CK: 713 U/L — ABNORMAL HIGH (ref 49–397)

## 2015-11-05 LAB — ECHOCARDIOGRAM COMPLETE
Height: 69 in
WEIGHTICAEL: 4964.8 [oz_av]

## 2015-11-05 LAB — HEPARIN LEVEL (UNFRACTIONATED)
HEPARIN UNFRACTIONATED: 0.36 [IU]/mL (ref 0.30–0.70)
Heparin Unfractionated: 0.44 IU/mL (ref 0.30–0.70)

## 2015-11-05 LAB — HEMOGLOBIN A1C: HEMOGLOBIN A1C: 6.2 % — AB (ref 4.0–6.0)

## 2015-11-05 LAB — MRSA PCR SCREENING: MRSA BY PCR: NEGATIVE

## 2015-11-05 LAB — TROPONIN I: TROPONIN I: 5.39 ng/mL — AB (ref <0.031)

## 2015-11-05 SURGERY — CORONARY/GRAFT ANGIOGRAPHY
Anesthesia: Moderate Sedation

## 2015-11-05 MED ORDER — AMIODARONE HCL IN DEXTROSE 360-4.14 MG/200ML-% IV SOLN
60.0000 mg/h | INTRAVENOUS | Status: DC
Start: 2015-11-05 — End: 2015-11-15

## 2015-11-05 MED ORDER — MORPHINE SULFATE (PF) 2 MG/ML IV SOLN
2.0000 mg | Freq: Once | INTRAVENOUS | Status: AC
  Administered 2015-11-05: 2 mg via INTRAVENOUS

## 2015-11-05 MED ORDER — HEPARIN SODIUM (PORCINE) 1000 UNIT/ML IJ SOLN
INTRAMUSCULAR | Status: AC
Start: 2015-11-05 — End: 2015-11-05
  Filled 2015-11-05: qty 1

## 2015-11-05 MED ORDER — ASPIRIN 81 MG PO CHEW
81.0000 mg | CHEWABLE_TABLET | Freq: Every day | ORAL | Status: DC
  Administered 2015-11-06 – 2015-11-15 (×10): 81 mg via ORAL
  Filled 2015-11-05 (×10): qty 1

## 2015-11-05 MED ORDER — SODIUM CHLORIDE 0.9% FLUSH
3.0000 mL | Freq: Two times a day (BID) | INTRAVENOUS | Status: DC
  Administered 2015-11-05 – 2015-11-15 (×12): 3 mL via INTRAVENOUS

## 2015-11-05 MED ORDER — DOPAMINE-DEXTROSE 3.2-5 MG/ML-% IV SOLN
INTRAVENOUS | Status: DC | PRN
  Administered 2015-11-05: 10 ug/kg/min via INTRAVENOUS

## 2015-11-05 MED ORDER — NITROGLYCERIN 2 % TD OINT: 1.0000 [in_us] | TOPICAL_OINTMENT | Freq: Four times a day (QID) | TRANSDERMAL | Status: DC

## 2015-11-05 MED ORDER — MORPHINE SULFATE (PF) 2 MG/ML IV SOLN
INTRAVENOUS | Status: AC
  Filled 2015-11-05: qty 1

## 2015-11-05 MED ORDER — HEPARIN (PORCINE) IN NACL 100-0.45 UNIT/ML-% IJ SOLN
INTRAMUSCULAR | Status: DC | PRN
  Administered 2015-11-05: 2100 [IU]/h via INTRAVENOUS

## 2015-11-05 MED ORDER — SODIUM CHLORIDE 0.9% FLUSH: 3.0000 mL | INTRAVENOUS | Status: DC | PRN

## 2015-11-05 MED ORDER — FUROSEMIDE 10 MG/ML IJ SOLN
40.0000 mg | INTRAMUSCULAR | Status: AC
  Administered 2015-11-05: 40 mg via INTRAVENOUS
  Filled 2015-11-05: qty 4

## 2015-11-05 MED ORDER — METOPROLOL TARTRATE 1 MG/ML IV SOLN
INTRAVENOUS | Status: AC
  Filled 2015-11-05: qty 5

## 2015-11-05 MED ORDER — SODIUM CHLORIDE 0.9 % WEIGHT BASED INFUSION: 3.0000 mL/kg/h | INTRAVENOUS | Status: DC

## 2015-11-05 MED ORDER — AMIODARONE HCL IN DEXTROSE 360-4.14 MG/200ML-% IV SOLN
60.0000 mg/h | INTRAVENOUS | Status: AC
  Administered 2015-11-05 (×2): 60 mg/h via INTRAVENOUS
  Filled 2015-11-05: qty 200

## 2015-11-05 MED ORDER — SODIUM CHLORIDE 0.9 % IV SOLN: 250.0000 mL | INTRAVENOUS | Status: DC | PRN

## 2015-11-05 MED ORDER — HEPARIN (PORCINE) IN NACL 100-0.45 UNIT/ML-% IJ SOLN
2900.0000 [IU]/h | INTRAMUSCULAR | Status: DC
  Administered 2015-11-05 – 2015-11-06 (×4): 2100 [IU]/h via INTRAVENOUS
  Administered 2015-11-07: 2250 [IU]/h via INTRAVENOUS
  Administered 2015-11-08: 2900 [IU]/h via INTRAVENOUS
  Administered 2015-11-08: 2650 [IU]/h via INTRAVENOUS
  Filled 2015-11-05 (×6): qty 250

## 2015-11-05 MED ORDER — SODIUM BICARBONATE 8.4 % IV SOLN
INTRAVENOUS | Status: AC
  Filled 2015-11-05: qty 50

## 2015-11-05 MED ORDER — HEPARIN (PORCINE) IN NACL 100-0.45 UNIT/ML-% IJ SOLN
INTRAMUSCULAR | Status: AC
  Filled 2015-11-05: qty 250

## 2015-11-05 MED ORDER — ONDANSETRON HCL 4 MG/2ML IJ SOLN: 4.0000 mg | Freq: Four times a day (QID) | INTRAMUSCULAR | Status: DC

## 2015-11-05 MED ORDER — AMIODARONE HCL 150 MG/3ML IV SOLN
INTRAVENOUS | Status: AC
  Filled 2015-11-05: qty 3

## 2015-11-05 MED ORDER — HEPARIN (PORCINE) IN NACL 100-0.45 UNIT/ML-% IJ SOLN: 1750.0000 [IU]/h | INTRAMUSCULAR | Status: DC

## 2015-11-05 MED ORDER — HEPARIN SODIUM (PORCINE) 1000 UNIT/ML IJ SOLN
INTRAMUSCULAR | Status: DC | PRN
  Administered 2015-11-05: 5000 [IU] via INTRAVENOUS

## 2015-11-05 MED ORDER — PANTOPRAZOLE SODIUM 40 MG IV SOLR
40.0000 mg | Freq: Two times a day (BID) | INTRAVENOUS | Status: DC
  Administered 2015-11-05 – 2015-11-09 (×9): 40 mg via INTRAVENOUS
  Filled 2015-11-05 (×10): qty 40

## 2015-11-05 MED ORDER — AMIODARONE HCL IN DEXTROSE 360-4.14 MG/200ML-% IV SOLN
30.0000 mg/h | INTRAVENOUS | Status: DC
  Filled 2015-11-05: qty 200

## 2015-11-05 MED ORDER — AMIODARONE LOAD VIA INFUSION
150.0000 mg | Freq: Once | INTRAVENOUS | Status: DC
  Filled 2015-11-05: qty 83.34

## 2015-11-05 MED ORDER — MIDAZOLAM HCL 2 MG/2ML IJ SOLN
INTRAMUSCULAR | Status: DC | PRN
  Administered 2015-11-05 (×2): 1 mg via INTRAVENOUS

## 2015-11-05 MED ORDER — AMIODARONE HCL IN DEXTROSE 360-4.14 MG/200ML-% IV SOLN
30.0000 mg/h | INTRAVENOUS | Status: DC
  Administered 2015-11-05 – 2015-11-09 (×7): 30 mg/h via INTRAVENOUS
  Filled 2015-11-05 (×7): qty 200

## 2015-11-05 MED ORDER — CHLORHEXIDINE GLUCONATE 0.12 % MT SOLN
15.0000 mL | Freq: Two times a day (BID) | OROMUCOSAL | Status: DC
  Administered 2015-11-05 – 2015-11-14 (×19): 15 mL via OROMUCOSAL
  Filled 2015-11-05 (×11): qty 15

## 2015-11-05 MED ORDER — ACETAMINOPHEN 325 MG PO TABS
650.0000 mg | ORAL_TABLET | ORAL | Status: DC | PRN
  Administered 2015-11-06 – 2015-11-12 (×6): 650 mg via ORAL
  Filled 2015-11-05 (×6): qty 2

## 2015-11-05 MED ORDER — IOHEXOL 300 MG/ML  SOLN
INTRAMUSCULAR | Status: DC | PRN
  Administered 2015-11-05: 70 mL via INTRA_ARTERIAL

## 2015-11-05 MED ORDER — FUROSEMIDE 10 MG/ML IJ SOLN
40.0000 mg | Freq: Once | INTRAMUSCULAR | Status: AC
  Administered 2015-11-05: 40 mg via INTRAVENOUS
  Filled 2015-11-05: qty 4

## 2015-11-05 MED ORDER — NITROGLYCERIN IN D5W 200-5 MCG/ML-% IV SOLN
INTRAVENOUS | Status: AC
  Filled 2015-11-05: qty 250

## 2015-11-05 MED ORDER — PANTOPRAZOLE SODIUM 40 MG IV SOLR: 40.0000 mg | Freq: Two times a day (BID) | INTRAVENOUS | Status: DC

## 2015-11-05 MED ORDER — MIDAZOLAM HCL 2 MG/2ML IJ SOLN
INTRAMUSCULAR | Status: AC
Start: 2015-11-05 — End: 2015-11-05
  Filled 2015-11-05: qty 2

## 2015-11-05 MED ORDER — DOPAMINE-DEXTROSE 3.2-5 MG/ML-% IV SOLN
INTRAVENOUS | Status: AC
  Filled 2015-11-05: qty 250

## 2015-11-05 MED ORDER — ATORVASTATIN CALCIUM 80 MG PO TABS
80.0000 mg | ORAL_TABLET | Freq: Every evening | ORAL | Status: DC
  Administered 2015-11-05 – 2015-11-14 (×10): 80 mg via ORAL
  Filled 2015-11-05 (×10): qty 1

## 2015-11-05 MED ORDER — FENTANYL CITRATE (PF) 100 MCG/2ML IJ SOLN
INTRAMUSCULAR | Status: DC | PRN
  Administered 2015-11-05: 25 ug via INTRAVENOUS

## 2015-11-05 MED ORDER — TIROFIBAN HCL IN NACL 5-0.9 MG/100ML-% IV SOLN
INTRAVENOUS | Status: DC | PRN
  Administered 2015-11-05: 0.075 ug/kg/min via INTRAVENOUS

## 2015-11-05 MED ORDER — AMIODARONE HCL IN DEXTROSE 360-4.14 MG/200ML-% IV SOLN
60.0000 mg/h | INTRAVENOUS | Status: DC
  Administered 2015-11-05: 60 mg/h via INTRAVENOUS
  Filled 2015-11-05: qty 200

## 2015-11-05 MED ORDER — MORPHINE SULFATE (PF) 2 MG/ML IV SOLN: 2.0000 mg | INTRAVENOUS | Status: DC | PRN

## 2015-11-05 MED ORDER — TIROFIBAN (AGGRASTAT) BOLUS VIA INFUSION
INTRAVENOUS | Status: DC | PRN
  Administered 2015-11-05: 3520 ug via INTRAVENOUS

## 2015-11-05 MED ORDER — FENTANYL CITRATE (PF) 100 MCG/2ML IJ SOLN
INTRAMUSCULAR | Status: AC
  Filled 2015-11-05: qty 2

## 2015-11-05 MED ORDER — DOPAMINE-DEXTROSE 3.2-5 MG/ML-% IV SOLN
0.0000 ug/kg/min | INTRAVENOUS | Status: DC
  Administered 2015-11-05: 10 ug/kg/min via INTRAVENOUS
  Administered 2015-11-05 – 2015-11-06 (×2): 6 ug/kg/min via INTRAVENOUS
  Filled 2015-11-05 (×2): qty 250

## 2015-11-05 MED ORDER — ASPIRIN EC 81 MG PO TBEC: 81.0000 mg | DELAYED_RELEASE_TABLET | Freq: Every day | ORAL | Status: DC

## 2015-11-05 MED ORDER — TIROFIBAN HCL IN NACL 5-0.9 MG/100ML-% IV SOLN
INTRAVENOUS | Status: AC
  Filled 2015-11-05: qty 100

## 2015-11-05 MED ORDER — TIROFIBAN HCL IN NACL 5-0.9 MG/100ML-% IV SOLN: 0.0750 ug/kg/min | INTRAVENOUS | Status: DC

## 2015-11-05 MED ORDER — MORPHINE SULFATE (PF) 2 MG/ML IV SOLN
1.0000 mg | INTRAVENOUS | Status: DC | PRN
  Administered 2015-11-05 – 2015-11-13 (×11): 1 mg via INTRAVENOUS
  Filled 2015-11-05 (×11): qty 1

## 2015-11-05 MED ORDER — ONDANSETRON HCL 4 MG/2ML IJ SOLN
4.0000 mg | Freq: Four times a day (QID) | INTRAMUSCULAR | Status: DC | PRN
  Administered 2015-11-06: 4 mg via INTRAVENOUS
  Filled 2015-11-05: qty 2

## 2015-11-05 MED ORDER — AMIODARONE HCL IN DEXTROSE 360-4.14 MG/200ML-% IV SOLN: 30.0000 mg/h | INTRAVENOUS | Status: DC

## 2015-11-05 MED ORDER — CETYLPYRIDINIUM CHLORIDE 0.05 % MT LIQD
7.0000 mL | Freq: Two times a day (BID) | OROMUCOSAL | Status: DC
  Administered 2015-11-05 – 2015-11-13 (×10): 7 mL via OROMUCOSAL

## 2015-11-05 MED ORDER — AMIODARONE LOAD VIA INFUSION
INTRAVENOUS | Status: DC | PRN
  Administered 2015-11-05: 150 mg via INTRAVENOUS

## 2015-11-05 MED ORDER — NITROGLYCERIN 0.4 MG SL SUBL: 0.4000 mg | SUBLINGUAL_TABLET | SUBLINGUAL | Status: DC | PRN

## 2015-11-05 MED ORDER — HEPARIN (PORCINE) IN NACL 2-0.9 UNIT/ML-% IJ SOLN
INTRAMUSCULAR | Status: AC
  Filled 2015-11-05: qty 500

## 2015-11-05 SURGICAL SUPPLY — 13 items
CATH IAB 7FR 40ML (CATHETERS) ×2 IMPLANT
CATH INFINITI 5FR ANG PIGTAIL (CATHETERS) ×3 IMPLANT
CATH INFINITI 5FR JL4 (CATHETERS) ×3 IMPLANT
CATH INFINITI JR4 5F (CATHETERS) ×3 IMPLANT
DEVICE SECURE STATLOCK IABP (MISCELLANEOUS) ×4 IMPLANT
KIT MANI 3VAL PERCEP (MISCELLANEOUS) ×3 IMPLANT
KIT TRANSPAC II SGL 4260605 (MISCELLANEOUS) ×2 IMPLANT
NDL PERC 18GX7CM (NEEDLE) ×1 IMPLANT
NEEDLE PERC 18GX7CM (NEEDLE) ×3 IMPLANT
PACK CARDIAC CATH (CUSTOM PROCEDURE TRAY) ×3 IMPLANT
SHEATH AVANTI 5FR X 11CM (SHEATH) ×3 IMPLANT
SUT SILK 0 FSL (SUTURE) ×2 IMPLANT
WIRE EMERALD 3MM-J .035X150CM (WIRE) ×3 IMPLANT

## 2015-11-05 NOTE — H&P (Signed)
History & Physical    Patient ID: Joshua Larsen MRN: [id number], DOB/AGE: 09/18/1950   Admit date: 11/05/2015   Primary Physician: Megan Mans, MD Primary Cardiologist: Kirtland Bouchard. Lady Gary, MD  Patient Profile    65 y/o ? with a h/o CAD s/p remote CABG who presents on tx from [facility] 2/2 NSTEMI and cardiogenic shock req IABP.  Past Medical History    Past Medical History  Diagnosis Date  . Hypertensive heart disease   . CAD (coronary artery disease)     a. 08/2000 s/p CABG x 5 (LIMA->LAD, VG->Diag, VG->dRCA->RPL, VG->OM); b. 07/2014 PCI VG->OM, VG->Diag 100, VG->PDA->RPL 100;  c. 08/2014 Cath: patent stent in VG->OM1->Med Rx; d. 10/2015 NSTEMI/VF arrest/CG Shock.  Marland Kitchen GERD (gastroesophageal reflux disease)   . CKD (chronic kidney disease), stage III   . Sleep apnea     a. On CPAP.  Marland Kitchen Chronic stable angina (HCC)   . Diet-controlled diabetes mellitus [facility])     Past Surgical History  Procedure Laterality Date  . Cardiac catheterization      [place]  . Back surgery    . Replacement total knee Bilateral     a. 01/2003 LTKA; b. 07/2007 RTKA  . Coronary artery bypass graft      2000  . Wrist surgery Right      Allergies  Allergies  Allergen Reactions  . Amoxicillin   . Codeine     GI side effects  . Tagamet  [Cimetidine] Other (See Comments)    Causes changes in mental status Causes changes in mental status    History of Present Illness    65 y/o ? with the above complex PMH.  He has a h/o severe multivessel CAD s/p CABG x 5 in 2002.  He subsequently suffered a NSTEMI and underwent PCI of the VG  OM1 in 07/2014.  Per notes, the LIMA  LAD was patent at that time while the VG  D1 and sequential VG  RPDA  RPL was occluded.  He had repeat ath in 08/2014, revealing patency of the VG  OM1.  He has been medically managed since. He was last admitted to [facility] in 05/2015 in the setting of postural dizziness and elevated troponin.  He was medically managed.   Per outpt clinic notes,  he has some degree of chronic, intermittent exertional angina, which has been nitrate responsive.  Over the past several weeks, he has been having more frequent exertional discomfort between his shoulder blades and into his chest.  He presented to [facility] on 3/15 secondary to recurrent c/p and was found to have an elevated troponin.  This eventually rose to 5.92.  He underwent catheterization this AM revealing severe multivessel native and graft dzs, including occlusions of the VG  RPDA  RPL, VG  D1, and a new thrombotic occlusion in the VG  OM1.  During the procedure, the pt suffered from a VF arrest requiring precordial thump.  He then required IABP placement for hypotension and CGS. He as tx to [facility] for further eval.  He continues to c/o 5/10 chest pain.  SBP trending in the low 100's on dopamine.   Home Medications    Prior to Admission medications   Medication Sig Start Date End Date Taking? Authorizing Provider  amiodarone (NEXTERONE PREMIX) 360 MG/200ML SOLN Inject 33.33 mL/hr into the vein continuous. 11/05/15  Yes Enid Baas, MD  amiodarone (NEXTERONE PREMIX) 360 MG/200ML SOLN Inject 16.67 mL/hr into the vein continuous. 11/05/15  Yes Radhika  Kalisetti, MD  aspirin EC 81 MG tablet Take 81 mg by mouth daily.   Yes Historical Provider, MD  atorvastatin (LIPITOR) 20 MG tablet Take 20 mg by mouth every evening.    Yes Historical Provider, MD  fenofibrate 160 MG tablet Take 1 tablet (160 mg total) by mouth daily. 05/14/15  Yes Richard Hulen Shouts., MD  heparin 100-0.45 UNIT/ML-% infusion Inject 1,750 Units/hr into the vein continuous. 11/05/15  Yes Enid Baas, MD  isosorbide mononitrate (IMDUR) 60 MG 24 hr tablet Take 60 mg by mouth 2 (two) times daily.   Yes Historical Provider, MD  metoprolol tartrate (LOPRESSOR) 25 MG tablet Take 1 tablet (25 mg total) by mouth 2 (two) times daily. 06/20/15  Yes Katharina Caper, MD  morphine 2 MG/ML injection Inject 1 mL (2 mg total) into the vein every 4  (four) hours as needed. 11/05/15  Yes Enid Baas, MD  nitroGLYCERIN (NITROGLYN) 2 % ointment Apply 1 inch topically every 6 (six) hours. 11/05/15  Yes Enid Baas, MD  ondansetron (ZOFRAN) 4 MG/2ML SOLN injection Inject 2 mLs (4 mg total) into the vein every 6 (six) hours. 11/05/15  Yes Enid Baas, MD  pantoprazole (PROTONIX) 40 MG injection Inject 40 mg into the vein every 12 (twelve) hours. 11/05/15  Yes Enid Baas, MD  ranolazine (RANEXA) 500 MG 12 hr tablet Take 1 tablet (500 mg total) by mouth 2 (two) times daily. 06/20/15  Yes Katharina Caper, MD    Family History    Family History  Problem Relation Age of Onset  . Liver cancer Mother   . CAD Mother   . Arthritis Mother   . Heart attack Father   . CVA Father   . CAD Father   . Dementia Sister     Social History    Social History   Social History  . Marital Status: Divorced    Spouse Name: divorced  . Number of Children: 1  . Years of Education: 14   Occupational History  . retired    Social History Main Topics  . Smoking status: Never Smoker   . Smokeless tobacco: Never Used  . Alcohol Use: No  . Drug Use: No  . Sexual Activity: No   Other Topics Concern  . Not on file   Social History Narrative   Lives at home. He is retired now. 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     Review of Systems    General:  No chills, fever, night sweats or weight changes.  Cardiovascular:  +++ chest pain, +++ dyspnea on exertion,  No edema, orthopnea, palpitations, paroxysmal nocturnal dyspnea. Dermatological: No rash, lesions/masses Respiratory: No cough, dyspnea Urologic: No hematuria, dysuria Abdominal:   No nausea, vomiting, diarrhea, bright red blood per rectum, melena, or hematemesis Neurologic:  No visual changes, wkns, changes in mental status. All other systems reviewed and are otherwise negative except as noted above.  Physical Exam    Blood pressure 99/61, pulse 107, resp. rate 14,  height 5\' 9"  (1.753 m), weight 319 lb 3.6 oz (144.8 kg), SpO2 99 %.  General: Pleasant.  Bipap in place. Psych: Flat affect. Neuro: Alert and oriented X 3. Moves all extremities spontaneously. HEENT: Normal  Neck: Supple without bruits.  Difficult to assess JVP 2/2 girth. Lungs:  Resp regular and unlabored, diminished breath sounds bilat. Heart: RRR, distant, no s3, s4, or murmurs. Abdomen: Soft, non-tender, non-distended, BS + x 4.  Extremities: No clubbing, cyanosis or edema. DP/PT/Radials 2+  and equal bilaterally.  Labs     Recent Labs  11/04/15 0803 11/04/15 0936 11/04/15 1528 11/04/15 2126  TROPONINI 0.09* 0.10* 1.33* 5.92*   Lab Results  Component Value Date   WBC 9.6 11/05/2015   HGB 11.6* 11/05/2015   HCT 35.6* 11/05/2015   MCV 79.1* 11/05/2015   PLT 157 11/05/2015     Recent Labs Lab 11/05/15 0420  NA 138  K 4.8  CL 107  CO2 24  BUN 32*  CREATININE 1.92*  CALCIUM 8.9  GLUCOSE 122*   Lab Results  Component Value Date   CHOL 115 06/08/2015   HDL 18* 06/08/2015   LDLCALC 63 06/08/2015   TRIG 168* 06/08/2015     Radiology Studies    Dg Chest 1 View  11/05/2015  CLINICAL DATA:  Check balloon placement.  Cardiac catheterization EXAM: CHEST 1 VIEW COMPARISON:  11/04/2015 FINDINGS: Portable image in cardiac catheterization. There is a metal marker overlying the aortic arch compatible with intra-aortic balloon pump. Cardiac enlargement with CABG. Mild vascular congestion and mild interstitial edema. No pleural effusion. IMPRESSION: Intra-aortic balloon pump marker at the level the aortic arch Cardiac enlargement with mild heart failure and interstitial edema. These results were called by telephone at the time of interpretation on 11/05/2015 at 9:51 am to [facility] in the cardiac cath lab who verbally acknowledged these results. Electronically Signed   By: Marlan Palau M.D.   On: 11/05/2015 09:52   Dg Chest 2 View  11/04/2015  CLINICAL DATA:  Left-sided chest  pain beginning last night. History of prior myocardial infarction. Initial encounter. EXAM: CHEST  2 VIEW COMPARISON:  PA and lateral chest 06/18/2015.  CT chest 09/16/2014. FINDINGS: The patient is status post CABG. Heart size is upper normal. There is no pneumothorax or pleural effusion. No focal airspace disease is identified. Multilevel thoracic spondylosis is seen. Degenerative change is present about the shoulders. IMPRESSION: No acute disease. Electronically Signed   By: Drusilla Kanner M.D.   On: 11/04/2015 08:49    ECG & Cardiac Imaging    Sinus tachycardia, 101, lbbb - new this admission.  Assessment & Plan    1. NSTEMI/CAD/CGS/Hypotension/VF Arrest:  Pt presented to [facility] with progressive chest and back pain and r/i for NSTEMI.  Cath this AM revealed severe multivessel native CAD and severe graft dzs with 1/5 patent grafts and presumed acute/subacute occlusion of the VG  OM1.  In that setting, he suffered a VF arrest during the case with subsequent development of hypotension and c/p req IABP placement and initiation of dopamine.  He continues to c/o 5/10 c/p.  Cont IABP, heparin.  Will add prn mso4 and adjust vasopressor therapy as necessary.  Hold home doses  blocker, nitrate, ranexa.  Cont asa, statin, amio.  Will d/w Dr. Kirke Corin - no good percutaneous revasc options.  Will ask CT surgery to eval for possible redo.  2.  Hypertensive Heart Disease:  See above - currently hypotensive req dopamine/IABP.  3.  HL:  Cont high potency statin therapy.  4.  CKD III:  Creat 1.92 pre-cath.  CHF on cxr - will not be able to hydrate post-cath.  5.  OSA:  Cont CPAP @ night.  6.  Acute respiratory failure/Acute presumably systolic CHF:  In setting of above.  Currently on bipap. Wean as tolerated.  CXR reveals worsening edema  gentle diuresis. Echo done @ [facility] - awaiting read.  Signed, Nicolasa Ducking, NP 11/05/2015, 1:05 PM

## 2015-11-05 NOTE — Progress Notes (Signed)
Huey Bienenstock PA paged about IABP still in same position s/p Dr Kirke Corin repositioning. PA states he will have Dr Rennis Golden to see patient.

## 2015-11-05 NOTE — Progress Notes (Signed)
ANTICOAGULATION CONSULT NOTE - Initial Consult  Pharmacy Consult for heparin Indication: chest pain/ACS  Allergies  Allergen Reactions  . Amoxicillin   . Codeine     GI side effects  . Tagamet  [Cimetidine] Other (See Comments)    Causes changes in mental status Causes changes in mental status    Patient Measurements: Height: 5\' 9"  (175.3 cm) Weight: (!) 310 lb 4.8 oz (140.751 kg) IBW/kg (Calculated) : 70.7 Heparin Dosing Weight: 104kg  Vital Signs: Temp: 98.2 F (36.8 C) (03/16 0428) Temp Source: Oral (03/16 0428) BP: 97/49 mmHg (03/16 0428) Pulse Rate: 81 (03/16 0428)  Labs:  Recent Labs  11/04/15 0802  11/04/15 0803 11/04/15 0936 11/04/15 1528 11/04/15 1811 11/04/15 2126 11/05/15 0420  HGB  --   --  12.8*  --   --   --   --  11.6*  HCT  --   --  39.0*  --   --   --   --  35.6*  PLT  --   --  167  --   --   --   --  157  APTT 34  --   --   --   --   --   --   --   LABPROT 14.6  --   --   --   --   --   --   --   INR 1.12  --   --   --   --   --   --   --   HEPARINUNFRC  --   --   --   --   --  <0.10*  --  0.44  CREATININE  --   --  1.87*  --   --   --   --   --   TROPONINI  --   < > 0.09* 0.10* 1.33*  --  5.92*  --   < > = values in this interval not displayed.  Estimated Creatinine Clearance: 55.7 mL/min (by C-G formula based on Cr of 1.87).  Assessment: Pharmacy consulted to dose heparin for ACS in this 65 year old male. Not on anticoagulation PTA  Baseline labs: Hgb 12.8 Plt 167  INR 1.12 APTT 34  Goal of Therapy:  Heparin level 0.3-0.7   Plan:  Heparin level @ 0430 is therapeutic at 0.44. Continue current infusion rate of 1750 units/hr. Order confirmatory heparin level in 6 hours.  Thank you for the consult  Cindi Carbon, PharmD Clinical Pharmacist 11/05/2015,5:08 AM

## 2015-11-05 NOTE — Progress Notes (Signed)
  Subjective: Patient examined, coronary angiograms personally reviewed. Operative note of CABG times 08/26/2000 reviewed. 65 year old morbidly obese male with chronic renal failure presents with unstable angina. He states he had several cardiac catheterizations performed last year and apparently had a stent placed in one of his old bypass grafts. A year ago he had a patent circumflex graft, a patent diagonal graft and a patent left IMA to the LAD. He recently started having unstable angina and catheterization showed closure of the circumflex vein graft and thrombus in the diagonal vein graft with widely patent left IMA to LAD with collaterals to the circumflex and distal RCA system. His native vessels are atretic and not suitable for redo grafting. He is currently stable on a balloon pump in our CCU. Objective: Vital signs in last 24 hours: Temp:  [97.9 F (36.6 C)-98.2 F (36.8 C)] 98 F (36.7 C) (03/16 1215) Pulse Rate:  [46-144] 89 (03/16 1800) Cardiac Rhythm:  [-] Normal sinus rhythm (03/16 1600) Resp:  [0-34] 22 (03/16 1800) BP: (78-156)/(44-141) 111/86 mmHg (03/16 1800) SpO2:  [76 %-100 %] 85 % (03/16 1800) FiO2 (%):  [60 %] 60 % (03/16 1149) Weight:  [319 lb 3.6 oz (144.8 kg)] 319 lb 3.6 oz (144.8 kg) (03/16 1200)  Hemodynamic parameters for last 24 hours:  sinus rhythm, stable blood pressure  Intake/Output from previous day:   Intake/Output this shift:        Physical Exam  General: Obese Caucasian male lying supine comfortably in the CCU. Balloon pump via right femoral artery HEENT: Normocephalic pupils equal , dentition adequate Neck: Supple without JVD, adenopathy, or bruit Chest: Clear to auscultation, symmetrical breath sounds, no rhonchi, no tenderness             or deformity. Well-healed sternotomy incision. Cardiovascular: Regular rate and rhythm, no murmur, no gallop, peripheral pulses             palpable in all extremities Abdomen:  Soft, obese nontender,  no palpable mass or organomegaly Extremities: Warm, well-perfused, no clubbing cyanosis edema or tenderness,              no venous stasis changes of the legs. Well-healed surgical scar in left leg from saphenous vein open harvest 2002 Rectal/GU: Deferred Neuro: Grossly non--focal and symmetrical throughout Skin: Clean and dry without rash or ulceration   Lab Results:  Recent Labs  11/04/15 0803 11/05/15 0420  WBC 8.5 9.6  HGB 12.8* 11.6*  HCT 39.0* 35.6*  PLT 167 157   BMET:  Recent Labs  11/04/15 0803 11/05/15 0420  NA 135 138  K 4.5 4.8  CL 106 107  CO2 22 24  GLUCOSE 126* 122*  BUN 30* 32*  CREATININE 1.87* 1.92*  CALCIUM 9.1 8.9    PT/INR:  Recent Labs  11/04/15 0802  LABPROT 14.6  INR 1.12   ABG No results found for: PHART, HCO3, TCO2, ACIDBASEDEF, O2SAT CBG (last 3)  No results for input(s): GLUCAP in the last 72 hours.  Assessment/Plan: S/P   Patient is not a candidate for redo CABG because of lack of target vessels. Recommend reviewing films with interventional cardiology team for PCI versus medical therapy   LOS: 0 days    Kathlee Nations Trigt III 11/05/2015

## 2015-11-05 NOTE — Progress Notes (Signed)
.  ABP higher then expected on PCXR. IABP 30mm above aortic arch. Ward Givens NP at bedside and informed.

## 2015-11-05 NOTE — Progress Notes (Signed)
Md notified of elevated troponin and continuous chest pain.Acknowledged. No new orders given. Pt resting in bed. Will continue to monitor.

## 2015-11-05 NOTE — Progress Notes (Signed)
Orthopedic Tech Progress Note Patient Details:  Joshua Larsen 1951-01-24 [id number]  Ortho Devices Type of Ortho Device: Knee Immobilizer Ortho Device/Splint Location: RLE Ortho Device/Splint Interventions: Casandra Doffing 11/05/2015, 9:25 PM

## 2015-11-05 NOTE — Progress Notes (Signed)
ANTICOAGULATION CONSULT NOTE  Pharmacy Consult for heparin Indication: chest pain/ACS  Allergies  Allergen Reactions  . Amoxicillin   . Codeine     GI side effects  . Tagamet  [Cimetidine] Other (See Comments)    Causes changes in mental status Causes changes in mental status    Patient Measurements: Height: 5\' 9"  (175.3 cm) Weight: (!) 319 lb 3.6 oz (144.8 kg) IBW/kg (Calculated) : 70.7 Heparin Dosing Weight: 104kg  Vital Signs: Temp: 98 F (36.7 C) (03/16 1215) Temp Source: Oral (03/16 1215) BP: 127/67 mmHg (03/16 1415) Pulse Rate: 92 (03/16 1430)  Labs:  Recent Labs  11/04/15 0802  11/04/15 0803 11/04/15 0936 11/04/15 1528 11/04/15 1811 11/04/15 2126 11/05/15 0420 11/05/15 1420  HGB  --   --  12.8*  --   --   --   --  11.6*  --   HCT  --   --  39.0*  --   --   --   --  35.6*  --   PLT  --   --  167  --   --   --   --  157  --   APTT 34  --   --   --   --   --   --   --   --   LABPROT 14.6  --   --   --   --   --   --   --   --   INR 1.12  --   --   --   --   --   --   --   --   HEPARINUNFRC  --   --   --   --   --  <0.10*  --  0.44 0.36  CREATININE  --   --  1.87*  --   --   --   --  1.92*  --   TROPONINI  --   < > 0.09* 0.10* 1.33*  --  5.92*  --   --   < > = values in this interval not displayed.  Estimated Creatinine Clearance: 55.1 mL/min (by C-G formula based on Cr of 1.92).  Assessment: Pharmacy consulted to dose heparin for ACS in this 65 year old male. Not on anticoagulation PTA  hx cad, cath 3/16 revealed 1/5 patent grafts, vfib arrest on table, IABP placed d/t cardiogenic shock, aggrastat stopped after transfer Unsure exactly where heparin rate was changed from 1750>>2100 units/hr but HL continues to be at goal regardless.  Goal of Therapy:  Heparin level 0.2-0.5   Plan:  Continue heparin at 2100/hr Daily HL and CBC   Thank you for the consult  Sheppard Coil PharmD., BCPS Clinical Pharmacist Pager [phone] 11/05/2015 3:08 PM

## 2015-11-05 NOTE — Progress Notes (Signed)
Called to the bedside to adjust the patient's IABP. After placement it was adjusted by Dr. Kirke Corin. Repeat CXR demonstrates high position of the tip. On entering the room, the patient is noted to be moving his leg. I suspect the balloon pump migrated due to this. The device is in a sterile sheath. I unsecured the leg restraint and put the IABP on standby. HR and BP were stable. The IABP was then withdrawn approximately 2 cm and re-attached/dressed. The patient was then put in a leg immobilizer. The IABP was restarted and a STAT portable CXR was obtained. This demonstrated the balloon about 1 cm too low - I then advanced it and obtained another x-ray. I personally reviewed the x-ray to confirm the position of the tip was at the level of the carina. He appears to be developing worsening pulmonary edema. Will also give an additional dose of IV lasix 40 mg x 1 now.  Time spent with patient - 30 minutes.  Chrystie Nose, MD, [facility] Attending Cardiologist [facility]

## 2015-11-05 NOTE — Discharge Summary (Signed)
[facility] - [place] at [facility]   PATIENT NAME: Joshua Larsen    MR#:  [id number]  DATE OF BIRTH:  [birth year]  DATE OF ADMISSION:  11/04/2015 ADMITTING PHYSICIAN: Enid Baas, MD  DATE OF DISCHARGE: 11/05/15  PRIMARY CARE PHYSICIAN: Megan Mans, MD    ADMISSION DIAGNOSIS:  Acute coronary syndrome (HCC) [I24.9]  DISCHARGE DIAGNOSIS:  Active Problems:   Unstable angina (HCC)   SECONDARY DIAGNOSIS:   Past Medical History  Diagnosis Date  . Hypertension   . Myocardial infarction (HCC)     1 stent, CABG  . GERD (gastroesophageal reflux disease)   . Chronic kidney disease   . Sleep apnea     C-Pap  . Chronic stable angina (HCC)   . Diet-controlled diabetes mellitus [facility])     HOSPITAL COURSE:   Joshua Larsen is a 65 y.o. 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  #1 NSTEMI- started as unstable angina yesterday. Troponins were barely elevated. Overnight he had a troponin jumped from 1.3-5.9. -Continues to have ongoing chest pain. Remains on heparin drip. -Cardiac catheterization done today, revealed acute occlusion to his venous graft to diagonal branch. Patient had a V. fib arrest on the cath table. -Currently on amiodarone drip, and prior to balloon pump placed. Also on BiPAP mask. He is stable and oriented at this time. -Patient will be transferred to [facility] for further cardiac intervention. - Continue amiodarone drip, Aggrastat drip and heparin drip - Also on dopamine drip - Cardiogenic shock and Vfib arrest. - family updated now -Continue aspirin, Plavix, statin and metoprolol. -If pain doesn't improve., We'll change to nitro drip.  #2 CAD status post CABG-follows with Dr. Lady Gary -on statin, metoprolol and Imdur. cardiac catheterization  with above mentioned findings - will be transferred to [facility]  #3 hypertension- on metoprolol, Imdur  #4 diabetes mellitus-diet-controlled at home.   #5 GERD-on IV Protonix   Transfer to [facility] for further cardiac intervention   DISCHARGE CONDITIONS:   Critical  CONSULTS OBTAINED:  Treatment Team:  Marcina Millard, MD  DRUG ALLERGIES:   Allergies  Allergen Reactions  . Amoxicillin   . Codeine     GI side effects  . Tagamet  [Cimetidine] Other (See Comments)    Causes changes in mental status Causes changes in mental status    DISCHARGE MEDICATIONS:   Current Discharge Medication List    START taking these medications   Details  !! amiodarone (NEXTERONE PREMIX) 360 MG/200ML SOLN Inject 33.33 mL/hr into the vein continuous. Qty: 100 mL, Refills: 0    !! amiodarone (NEXTERONE PREMIX) 360 MG/200ML SOLN Inject 16.67 mL/hr into the vein continuous. Qty: 100 mL, Refills: 0    heparin 100-0.45 UNIT/ML-% infusion Inject 1,750 Units/hr into the vein continuous. Qty: 250 mL, Refills: 0    morphine 2 MG/ML injection Inject 1 mL (2 mg total) into the vein every 4 (four) hours as needed. Qty: 1 mL, Refills: 0    nitroGLYCERIN (NITROGLYN) 2 % ointment Apply 1 inch topically every 6 (six) hours. Qty: 30 g, Refills: 0    ondansetron (ZOFRAN) 4 MG/2ML SOLN injection Inject 2 mLs (4 mg total) into the vein every 6 (six) hours. Qty: 2 mL, Refills: 0    pantoprazole (PROTONIX) 40 MG injection Inject 40  mg into the vein every 12 (twelve) hours. Qty: 1 each, Refills: 0     !! - Potential duplicate medications found. Please discuss with provider.    CONTINUE these medications which have NOT CHANGED   Details  aspirin EC 81 MG tablet Take 81 mg by mouth daily.    atorvastatin (LIPITOR) 20 MG tablet Take 20 mg by mouth every evening.     fenofibrate 160 MG tablet Take 1 tablet (160 mg total) by mouth daily. Qty: 30 tablet, Refills: 12    isosorbide  mononitrate (IMDUR) 60 MG 24 hr tablet Take 60 mg by mouth 2 (two) times daily. Refills: 5    metoprolol tartrate (LOPRESSOR) 25 MG tablet Take 1 tablet (25 mg total) by mouth 2 (two) times daily. Qty: 60 tablet, Refills: 6    ranolazine (RANEXA) 500 MG 12 hr tablet Take 1 tablet (500 mg total) by mouth 2 (two) times daily. Qty: 60 tablet, Refills: 6      STOP taking these medications     allopurinol (ZYLOPRIM) 100 MG tablet      clopidogrel (PLAVIX) 75 MG tablet      esomeprazole (NEXIUM) 40 MG capsule      hydrochlorothiazide (HYDRODIURIL) 25 MG tablet      nitroGLYCERIN (NITROSTAT) 0.4 MG SL tablet      valsartan (DIOVAN) 160 MG tablet      HYDROcodone-acetaminophen (NORCO/VICODIN) 5-325 MG per tablet          DISCHARGE INSTRUCTIONS:   Patient being transferred to [facility] Family updated outside the cath lab.   If you experience worsening of your admission symptoms, develop shortness of breath, life threatening emergency, suicidal or homicidal thoughts you must seek medical attention immediately by calling 911 or calling your MD immediately  if symptoms less severe.  You Must read complete instructions/literature along with all the possible adverse reactions/side effects for all the Medicines you take and that have been prescribed to you. Take any new Medicines after you have completely understood and accept all the possible adverse reactions/side effects.   Please note  You were cared for by a hospitalist during your hospital stay. If you have any questions about your discharge medications or the care you received while you were in the hospital after you are discharged, you can call the unit and asked to speak with the hospitalist on call if the hospitalist that took care of you is not available. Once you are discharged, your primary care physician will handle any further medical issues. Please note that NO REFILLS for any discharge medications will be  authorized once you are discharged, as it is imperative that you return to your primary care physician (or establish a relationship with a primary care physician if you do not have one) for your aftercare needs so that they can reassess your need for medications and monitor your lab values.    Today   CHIEF COMPLAINT:   Chief Complaint  Patient presents with  . Chest Pain    VITAL SIGNS:  Blood pressure 97/52, pulse 109, temperature 98.2 F (36.8 C), temperature source Oral, resp. rate 18, height 5\' 9"  (1.753 m), weight 140.751 kg (310 lb 4.8 oz), SpO2 98 %.  I/O:   Intake/Output Summary (Last 24 hours) at 11/05/15 1008 Last data filed at 11/04/15 1630  Gross per 24 hour  Intake    240 ml  Output      0 ml  Net    240 ml  PHYSICAL EXAMINATION:   Physical Exam  GENERAL:  66 y.o.-year-old patient critically ill appearing  lying in the bed with no acute distress.  EYES: Pupils equal, round, reactive to light and accommodation. No scleral icterus. Extraocular muscles intact.  HEENT: Head atraumatic, normocephalic. Oropharynx and nasopharynx clear. Bipap mask on NECK:  Supple, no jugular venous distention. No thyroid enlargement, no tenderness.  LUNGS: Normal breath sounds bilaterally, no wheezing, rales,rhonchi or crepitation. No use of accessory muscles of respiration.  CARDIOVASCULAR: S1, S2 normal. No murmurs, rubs, or gallops.  ABDOMEN: Soft, non-tender, non-distended. Bowel sounds present. No organomegaly or mass.  EXTREMITIES: No pedal edema, cyanosis, or clubbing.  NEUROLOGIC: Cranial nerves II through XII are intact. No new deficits. Sensation intact. Gait not checked.  PSYCHIATRIC: The patient is alert. Following commands SKIN: No obvious rash, lesion, or ulcer.   DATA REVIEW:   CBC  Recent Labs Lab 11/05/15 0420  WBC 9.6  HGB 11.6*  HCT 35.6*  PLT 157    Chemistries   Recent Labs Lab 11/05/15 0420  NA 138  K 4.8  CL 107  CO2 24  GLUCOSE 122*   BUN 32*  CREATININE 1.92*  CALCIUM 8.9    Cardiac Enzymes  Recent Labs Lab 11/04/15 2126  TROPONINI 5.92*    Microbiology Results  Results for orders placed or performed in visit on 12/13/13  Urine culture     Status: None   Collection Time: 12/13/13 12:00 PM  Result Value Ref Range Status   Micro Text Report   Final       SOURCE: CLEAN CATCH    COMMENT                   NO GROWTH IN 36 HOURS   ANTIBIOTIC                                                      Urine culture     Status: None   Collection Time: 12/14/13  1:29 PM  Result Value Ref Range Status   Micro Text Report   Final       SOURCE: IN/OUT CATH    COMMENT                   NO GROWTH IN 18-24 HOURS   ANTIBIOTIC                                                        RADIOLOGY:  Dg Chest 1 View  11/05/2015  CLINICAL DATA:  Check balloon placement.  Cardiac catheterization EXAM: CHEST 1 VIEW COMPARISON:  11/04/2015 FINDINGS: Portable image in cardiac catheterization. There is a metal marker overlying the aortic arch compatible with intra-aortic balloon pump. Cardiac enlargement with CABG. Mild vascular congestion and mild interstitial edema. No pleural effusion. IMPRESSION: Intra-aortic balloon pump marker at the level the aortic arch Cardiac enlargement with mild heart failure and interstitial edema. These results were called by telephone at the time of interpretation on 11/05/2015 at 9:51 am to [facility] in the cardiac cath lab who verbally acknowledged these results. Electronically Signed   By: Marlan Palau M.D.   On:  11/05/2015 09:52   Dg Chest 2 View  11/04/2015  CLINICAL DATA:  Left-sided chest pain beginning last night. History of prior myocardial infarction. Initial encounter. EXAM: CHEST  2 VIEW COMPARISON:  PA and lateral chest 06/18/2015.  CT chest 09/16/2014. FINDINGS: The patient is status post CABG. Heart size is upper normal. There is no pneumothorax or pleural effusion. No focal airspace disease  is identified. Multilevel thoracic spondylosis is seen. Degenerative change is present about the shoulders. IMPRESSION: No acute disease. Electronically Signed   By: Drusilla Kanner M.D.   On: 11/04/2015 08:49    EKG:   Orders placed or performed during the hospital encounter of 11/04/15  . EKG 12-Lead  . EKG 12-Lead  . ED EKG within 10 minutes  . ED EKG within 10 minutes      Management plans discussed with the patient, family and they are in agreement.  CODE STATUS:     Code Status Orders        Start     Ordered   11/04/15 1055  Full code   Continuous     11/04/15 1054    Code Status History    Date Active Date Inactive Code Status Order ID Comments User Context   06/18/2015  8:20 PM 06/21/2015  7:46 PM Full Code [id number]  Shaune Pollack, MD Inpatient      TOTAL TIME TAKING CARE OF THIS PATIENT: 45  minutes.    Enid Baas M.D on 11/05/2015 at 10:08 AM  Between 7am to 6pm - Pager - [phone]  After 6pm go to [email or web address] - password [email or web address] [facility]  [place] [facility]  Office  [phone]  CC: Primary care physician; Megan Mans, MD

## 2015-11-05 NOTE — Progress Notes (Signed)
Pt brought in home CPAP. Placed on CPAP with nasal mask with 5L O2 bleed in, pt much more comfortable and states he is able to rest now. Vitals stable, RT will continue to monitor.

## 2015-11-05 NOTE — Progress Notes (Signed)
Pt off unit in cath lab at change of shift/ per Dr. Juliann Pares pt is being transferred to [facility] from Cath lab/ belongings packed up and returned to pt family

## 2015-11-06 DIAGNOSIS — I25700 Atherosclerosis of coronary artery bypass graft(s), unspecified, with unstable angina pectoris: Secondary | ICD-10-CM

## 2015-11-06 LAB — BASIC METABOLIC PANEL
ANION GAP: 16 — AB (ref 5–15)
BUN: 32 mg/dL — ABNORMAL HIGH (ref 6–20)
CHLORIDE: 103 mmol/L (ref 101–111)
CO2: 20 mmol/L — AB (ref 22–32)
Calcium: 9.4 mg/dL (ref 8.9–10.3)
Creatinine, Ser: 2.46 mg/dL — ABNORMAL HIGH (ref 0.61–1.24)
GFR calc non Af Amer: 26 mL/min — ABNORMAL LOW (ref >60)
GFR, EST AFRICAN AMERICAN: 30 mL/min — AB (ref >60)
GLUCOSE: 151 mg/dL — AB (ref 65–99)
Potassium: 4.3 mmol/L (ref 3.5–5.1)
Sodium: 139 mmol/L (ref 135–145)

## 2015-11-06 LAB — CBC
HEMATOCRIT: 40.1 % (ref 39.0–52.0)
HEMOGLOBIN: 13.2 g/dL (ref 13.0–17.0)
MCH: 25.4 pg — AB (ref 26.0–34.0)
MCHC: 32.9 g/dL (ref 30.0–36.0)
MCV: 77.3 fL — AB (ref 78.0–100.0)
Platelets: 217 10*3/uL (ref 150–400)
RBC: 5.19 MIL/uL (ref 4.22–5.81)
RDW: 16 % — ABNORMAL HIGH (ref 11.5–15.5)
WBC: 16.8 10*3/uL — AB (ref 4.0–10.5)

## 2015-11-06 LAB — GLUCOSE, CAPILLARY
GLUCOSE-CAPILLARY: 137 mg/dL — AB (ref 65–99)
GLUCOSE-CAPILLARY: 104 mg/dL — AB (ref 65–99)
GLUCOSE-CAPILLARY: 118 mg/dL — AB (ref 65–99)
Glucose-Capillary: 154 mg/dL — ABNORMAL HIGH (ref 65–99)

## 2015-11-06 LAB — TROPONIN I: Troponin I: >65 ng/mL (ref <0.031)

## 2015-11-06 LAB — HEPARIN LEVEL (UNFRACTIONATED): Heparin Unfractionated: 0.3 IU/mL (ref 0.30–0.70)

## 2015-11-06 MED ORDER — ALPRAZOLAM 0.25 MG PO TABS
0.2500 mg | ORAL_TABLET | Freq: Four times a day (QID) | ORAL | Status: DC | PRN
  Administered 2015-11-09 – 2015-11-12 (×5): 0.25 mg via ORAL
  Filled 2015-11-06 (×5): qty 1

## 2015-11-06 MED ORDER — INSULIN ASPART 100 UNIT/ML ~~LOC~~ SOLN
0.0000 [IU] | Freq: Three times a day (TID) | SUBCUTANEOUS | Status: DC
  Administered 2015-11-06 – 2015-11-07 (×2): 2 [IU] via SUBCUTANEOUS
  Administered 2015-11-07 (×2): 3 [IU] via SUBCUTANEOUS
  Administered 2015-11-08: 2 [IU] via SUBCUTANEOUS
  Administered 2015-11-08 – 2015-11-09 (×2): 3 [IU] via SUBCUTANEOUS
  Administered 2015-11-09: 5 [IU] via SUBCUTANEOUS
  Administered 2015-11-10 (×3): 3 [IU] via SUBCUTANEOUS
  Administered 2015-11-11 (×2): 2 [IU] via SUBCUTANEOUS
  Administered 2015-11-12: 3 [IU] via SUBCUTANEOUS
  Administered 2015-11-12 – 2015-11-13 (×2): 2 [IU] via SUBCUTANEOUS
  Administered 2015-11-13: 3 [IU] via SUBCUTANEOUS
  Administered 2015-11-13 – 2015-11-14 (×2): 2 [IU] via SUBCUTANEOUS
  Administered 2015-11-14: 3 [IU] via SUBCUTANEOUS
  Administered 2015-11-14: 5 [IU] via SUBCUTANEOUS
  Administered 2015-11-15: 3 [IU] via SUBCUTANEOUS

## 2015-11-06 MED ORDER — ALUM & MAG HYDROXIDE-SIMETH 200-200-20 MG/5ML PO SUSP
30.0000 mL | ORAL | Status: DC | PRN
  Administered 2015-11-06 – 2015-11-07 (×2): 30 mL via ORAL
  Filled 2015-11-06 (×2): qty 30

## 2015-11-06 MED ORDER — SODIUM CHLORIDE 0.9 % IV SOLN
INTRAVENOUS | Status: DC
  Administered 2015-11-06: 22:00:00 via INTRAVENOUS

## 2015-11-06 NOTE — Progress Notes (Signed)
Pt uncomfortable with the pressure in his CPAP machine-chge to face tent-partial rebreather as tolerated.

## 2015-11-06 NOTE — Progress Notes (Signed)
ANTICOAGULATION CONSULT NOTE  Pharmacy Consult for heparin Indication: chest pain/ACS  Allergies  Allergen Reactions  . Amoxicillin   . Codeine     GI side effects  . Tagamet [Cimetidine] Other (See Comments)    Causes changes in mental status Causes changes in mental status    Patient Measurements: Height: 5\' 9"  (175.3 cm) Weight: (!) 312 lb 2.7 oz (141.6 kg) IBW/kg (Calculated) : 70.7 Heparin Dosing Weight: 104kg  Vital Signs: Temp: 98.7 F (37.1 C) (03/17 0800) Temp Source: Oral (03/17 0800) BP: 80/70 mmHg (03/17 1100) Pulse Rate: 78 (03/17 1100)  Labs:  Recent Labs  11/04/15 0802  11/04/15 0803  11/04/15 2126 11/05/15 0420 11/05/15 1420 11/06/15 0543  HGB  --   < > 12.8*  --   --  11.6*  --  13.2  HCT  --   --  39.0*  --   --  35.6*  --  40.1  PLT  --   --  167  --   --  157  --  217  APTT 34  --   --   --   --   --   --   --   LABPROT 14.6  --   --   --   --   --   --   --   INR 1.12  --   --   --   --   --   --   --   HEPARINUNFRC  --   --   --   < >  --  0.44 0.36 0.30  CREATININE  --   --  1.87*  --   --  1.92*  --  2.46*  CKTOTAL  --   --   --   --   --   --  713*  --   CKMB  --   --   --   --   --   --  86.4*  --   TROPONINI  --   --  0.09*  < > 5.92*  --  5.39* >65.00*  < > = values in this interval not displayed.  Estimated Creatinine Clearance: 42.5 mL/min (by C-G formula based on Cr of 2.46).  Assessment: Pharmacy consulted to dose heparin for ACS in this 65 year old male. Not on anticoagulation PTA  hx cad, cath 3/16 revealed 1/5 patent grafts, vfib arrest on table, IABP placed d/t cardiogenic shock, aggrastat stopped after transfer  HL remains at goal this morning. CBC stable, no bleeding issues noted.  Goal of Therapy:  Heparin level 0.2-0.5   Plan:  Continue heparin at 2100/hr Daily HL and CBC   Thank you for the consult  Sheppard Coil PharmD., BCPS Clinical Pharmacist Pager [phone] 11/06/2015 11:15 AM

## 2015-11-06 NOTE — Progress Notes (Signed)
Pt complaint about not feeling/well-denied any chest pain except been flatulence/stomach rumbling but not nauseated.Dr Onalee Hua came in to see pt and add troponin to am labs.

## 2015-11-06 NOTE — Progress Notes (Signed)
Patient Name: Joshua Larsen Date of Encounter: 11/06/2015  Active Problems:   Hypertensive heart disease   CAD (coronary artery disease)   GERD (gastroesophageal reflux disease)   Cardiogenic shock (HCC)   Encounter for management of intra-aortic balloon pump   Length of Stay: 1  SUBJECTIVE  He is uncomfortable and has issues with CPAP, but denies angina or dyspnea. CPAP causing air to gush through his left ear, a problem he has had before. Good IABP augmentation and excellent MAP on low dose dopamine IV. Good UO, but worsened renal function.  CURRENT MEDS . antiseptic oral rinse  7 mL Mouth Rinse q12n4p  . aspirin  81 mg Oral Daily  . atorvastatin  80 mg Oral QPM  . chlorhexidine  15 mL Mouth Rinse BID  . insulin aspart  0-15 Units Subcutaneous TID WC  . pantoprazole  40 mg Intravenous Q12H  . sodium chloride flush  3 mL Intravenous Q12H    OBJECTIVE   Intake/Output Summary (Last 24 hours) at 11/06/15 0933 Last data filed at 11/06/15 0900  Gross per 24 hour  Intake 1483.8 ml  Output   2710 ml  Net -1226.2 ml   Filed Weights   11/05/15 1200 11/06/15 0139  Weight: 144.8 kg (319 lb 3.6 oz) 141.6 kg (312 lb 2.7 oz)    PHYSICAL EXAM Filed Vitals:   11/06/15 0719 11/06/15 0800 11/06/15 0900 11/06/15 0910  BP:      Pulse:  80 91 86  Temp:  98.7 F (37.1 C)    TempSrc:  Oral    Resp:  Height:      Weight:      SpO2: 100% 100% 86% 99%   General: Alert, oriented x3, no distress Head: no evidence of trauma, PERRL, EOMI, no exophtalmos or lid lag, no myxedema, no xanthelasma; normal ears, nose and oropharynx Neck: normal jugular venous pulsations and no hepatojugular reflux; brisk carotid pulses without delay and no carotid bruits Chest: clear to auscultation, no signs of consolidation by percussion or palpation, normal fremitus, symmetrical and full respiratory excursions Cardiovascular: normal position and quality of the apical impulse, regular  rhythm, normal first and second heart sounds, loud IBP sounds cover the cardiac soundsno rubs or gallops, no murmur Abdomen: no tenderness or distention, no masses by palpation, no abnormal pulsatility or arterial bruits, normal bowel sounds, no hepatosplenomegaly Extremities: no clubbing, cyanosis or edema; 2+ radial, ulnar and brachial pulses bilaterally; 2+ right femoral, posterior tibial and dorsalis pedis pulses; 2+ left femoral, posterior tibial and dorsalis pedis pulses; no subclavian or femoral bruits. No problems at right groin IABP site. Neurological: grossly nonfocal  LABS  CBC  Recent Labs  11/05/15 0420 11/06/15 0543  WBC 9.6 16.8*  HGB 11.6* 13.2  HCT 35.6* 40.1  MCV 79.1* 77.3*  PLT 157 217   Basic Metabolic Panel  Recent Labs  11/05/15 0420 11/06/15 0543  NA 138 139  K 4.8 4.3  CL 107 103  CO2 24 20*  GLUCOSE 122* 151*  BUN 32* 32*  CREATININE 1.92* 2.46*  CALCIUM 8.9 9.4   Liver Function Tests No results for input(s): AST, ALT, ALKPHOS, BILITOT, PROT, ALBUMIN in the last 72 hours. No results for input(s): LIPASE, AMYLASE in the last 72 hours. Cardiac Enzymes  Recent Labs  11/04/15 2126 11/05/15 1420 11/06/15 0543  CKTOTAL  --  713*  --   CKMB  --  86.4*  --   TROPONINI 5.92* 5.39* >65.00*  BNP Invalid input(s): POCBNP D-Dimer No results for input(s): DDIMER in the last 72 hours. Hemoglobin A1C  Recent Labs  11/04/15 1811  HGBA1C 6.2*    Radiology Studies Imaging results have been reviewed and Dg Chest 1 View  11/05/2015  CLINICAL DATA:  Check balloon placement.  Cardiac catheterization EXAM: CHEST 1 VIEW COMPARISON:  11/04/2015 FINDINGS: Portable image in cardiac catheterization. There is a metal marker overlying the aortic arch compatible with intra-aortic balloon pump. Cardiac enlargement with CABG. Mild vascular congestion and mild interstitial edema. No pleural effusion. IMPRESSION: Intra-aortic balloon pump marker at the level the  aortic arch Cardiac enlargement with mild heart failure and interstitial edema. These results were called by telephone at the time of interpretation on 11/05/2015 at 9:51 am to [facility] in the cardiac cath lab who verbally acknowledged these results. Electronically Signed   By: Marlan Palau M.D.   On: 11/05/2015 09:52   Dg Chest Port 1 View  11/05/2015  CLINICAL DATA:  Balloon pump pulled back. EXAM: PORTABLE CHEST 1 VIEW COMPARISON:  11/05/2015 FINDINGS: Sternotomy wires present. Intra aortic balloon pump is not significantly changed in position and remains over the expected region of the posterior arch at the level of the fourth posterior rib. Lungs are somewhat hypoinflated demonstrate worsening bilateral perihilar opacification right worse than left likely moderate interstitial edema. No evidence of effusion. Cardiomediastinal silhouette and remainder of the exam is unchanged. IMPRESSION: Worsening bilateral perihilar opacification right worse than left likely interstitial edema. Intra aortic balloon pump without significant change as tip appears overlying the expected region of the posterior arch at the level of the fourth posterior rib. Electronically Signed   By: Elberta Fortis M.D.   On: 11/05/2015 17:45   Dg Chest Port 1 View  11/05/2015  CLINICAL DATA:  Status post cardiac catheterization. History of stent placement in left coronary artery. EXAM: PORTABLE CHEST 1 VIEW COMPARISON:  November 05, 2015 FINDINGS: No pneumothorax. The metallic distal tip of the intra-aortic balloon pump terminates at the level of the aortic arch. The distal tip is approximately is 8 mm from the top of the arch. This is closer to the arch than expected. However, the finding is stable. Stable cardiomegaly. The hila and mediastinum are unchanged. Increasing pulmonary edema. IMPRESSION: 1. The distal tip of the intra-aortic balloon pump is 8 mm from the top of the aortic arch, higher than expected. 2. Increasing pulmonary edema.  These results will be called to the ordering clinician or representative by the Radiologist Assistant, and communication documented in the PACS or zVision Dashboard. Electronically Signed   By: Gerome Sam III M.D   On: 11/05/2015 13:04   Dg Chest Port 1v Same Day  11/05/2015  CLINICAL DATA:  Readjusted balloon pump. EXAM: PORTABLE CHEST 1 VIEW COMPARISON:  Chest x-ray from earlier same day. FINDINGS: Marker for the intra-aortic balloon pump is now positioned at the level of the carina. Cardiomegaly is stable. Central pulmonary vascular congestion and mild bilateral interstitial edema appears stable. No new lung findings. No pleural effusion. No pneumothorax seen. IMPRESSION: 1. Marker for intra-aortic balloon pump now positioned at the level of the carina. 2. Cardiomegaly with central pulmonary vascular congestion and bilateral interstitial edema suggesting CHF/volume overload, stable compared to the exam from earlier today. Electronically Signed   By: Bary Richard M.D.   On: 11/05/2015 21:52   Dg Chest Port 1v Same Day  11/05/2015  CLINICAL DATA:  Intra-aortic balloon pump repositioning. Initial encounter. EXAM: PORTABLE CHEST  1 VIEW COMPARISON:  Chest radiograph performed earlier today at 5:22 p.m. FINDINGS: The patient's intra-aortic balloon pump tip is noted 1.5 cm below the level of the carina. Vascular congestion is again noted, with bilateral central airspace opacities, likely reflecting pulmonary edema, improved from the recent prior study. No definite pleural effusion or pneumothorax is seen, though the left costophrenic angle is incompletely imaged on this study. The cardiomediastinal silhouette is borderline normal in size. The patient is status post median sternotomy. No acute osseous abnormalities are identified. IMPRESSION: 1. Intra-aortic balloon pump tip noted 1.5 cm below the level of the carina. 2. Vascular congestion, with bilateral central airspace opacities, reflecting pulmonary  edema, improved from the recent prior study. Electronically Signed   By: Roanna Raider M.D.   On: 11/05/2015 21:23    TELE NSR with PVCs, rare and brief NSVT 3-5 beats  ECG NSR, LBBB  ASSESSMENT AND PLAN  1. NSTEMI/CAD/CGS/Hypotension/VF Arrest: Pt presented to [facility] with progressive chest and back pain and r/i for NSTEMI. Cath 3/16 revealed severe multivessel native CAD and severe graft dzs with 1/5 patent grafts and presumed acute/subacute occlusion of the SVG  Diag1. In that setting, he suffered a VF arrest during the case with subsequent development of hypotension and c/p req IABP placement and initiation of dopamine.  Now pain free. Cont IABP x 24 more hours, IV heparin. Reduce vasopressor therapy as tolerated, keep dopamine at 3 mcg/kg/min. Hold home doses ? blocker, nitrate, ranexa. Cont asa, statin, amio.Reviewed notes from Dr. Donata Clay and Dr. Kirke Corin - no good percutaneous or surgical revascularization options.   2. Hypertensive Heart Disease: See above - currently hypotensive req dopamine/IABP.  3. HL: Cont high potency statin therapy.  4. CKD III: Creat 1.92 pre-cath. Substantial worsening after cath (hemodynamic? Contrast-induced). CXR still shows volume overload, cannot give IVF.  5. OSA: Cont CPAP @ night.  6. Acute respiratory failure/Acute systolic CHF: better, less dyspnea today. CXR reveals pulmonary edema , improved after diuresis. Echo done @ [facility] shows EF 30-35%. No comment on diastolic function/filling pressures, but Doppler velocities confirm elevated filling pressures.Thurmon Fair, MD, [facility] [phone] office [phone] pager 11/06/2015 9:33 AM

## 2015-11-07 ENCOUNTER — Inpatient Hospital Stay (HOSPITAL_COMMUNITY): Payer: Medicare Other

## 2015-11-07 DIAGNOSIS — N179 Acute kidney failure, unspecified: Secondary | ICD-10-CM

## 2015-11-07 LAB — CBC
HCT: 36.5 % — ABNORMAL LOW (ref 39.0–52.0)
Hemoglobin: 11.6 g/dL — ABNORMAL LOW (ref 13.0–17.0)
MCH: 25 pg — AB (ref 26.0–34.0)
MCHC: 31.8 g/dL (ref 30.0–36.0)
MCV: 78.7 fL (ref 78.0–100.0)
PLATELETS: 168 10*3/uL (ref 150–400)
RBC: 4.64 MIL/uL (ref 4.22–5.81)
RDW: 16.3 % — AB (ref 11.5–15.5)
WBC: 10.2 10*3/uL (ref 4.0–10.5)

## 2015-11-07 LAB — CARBOXYHEMOGLOBIN
Carboxyhemoglobin: 2 % — ABNORMAL HIGH (ref 0.5–1.5)
Methemoglobin: 0.9 % (ref 0.0–1.5)
O2 Saturation: 58.7 %
Total hemoglobin: 11.9 g/dL — ABNORMAL LOW (ref 13.5–18.0)

## 2015-11-07 LAB — BASIC METABOLIC PANEL
ANION GAP: 17 — AB (ref 5–15)
ANION GAP: 12 (ref 5–15)
BUN: 36 mg/dL — AB (ref 6–20)
BUN: 36 mg/dL — ABNORMAL HIGH (ref 6–20)
CALCIUM: 9 mg/dL (ref 8.9–10.3)
CHLORIDE: 101 mmol/L (ref 101–111)
CO2: 22 mmol/L (ref 22–32)
CO2: 24 mmol/L (ref 22–32)
CREATININE: 2.24 mg/dL — AB (ref 0.61–1.24)
Calcium: 8.6 mg/dL — ABNORMAL LOW (ref 8.9–10.3)
Chloride: 102 mmol/L (ref 101–111)
Creatinine, Ser: 2.24 mg/dL — ABNORMAL HIGH (ref 0.61–1.24)
GFR calc Af Amer: 34 mL/min — ABNORMAL LOW (ref >60)
GFR calc non Af Amer: 29 mL/min — ABNORMAL LOW (ref >60)
GFR, EST AFRICAN AMERICAN: 34 mL/min — AB (ref >60)
GFR, EST NON AFRICAN AMERICAN: 29 mL/min — AB (ref >60)
GLUCOSE: 115 mg/dL — AB (ref 65–99)
Glucose, Bld: 195 mg/dL — ABNORMAL HIGH (ref 65–99)
POTASSIUM: 3.8 mmol/L (ref 3.5–5.1)
Potassium: 4 mmol/L (ref 3.5–5.1)
Sodium: 141 mmol/L (ref 135–145)
Sodium: 137 mmol/L (ref 135–145)

## 2015-11-07 LAB — GLUCOSE, CAPILLARY
GLUCOSE-CAPILLARY: 144 mg/dL — AB (ref 65–99)
GLUCOSE-CAPILLARY: 180 mg/dL — AB (ref 65–99)
Glucose-Capillary: 178 mg/dL — ABNORMAL HIGH (ref 65–99)

## 2015-11-07 LAB — HEPARIN LEVEL (UNFRACTIONATED)
HEPARIN UNFRACTIONATED: 0.1 [IU]/mL — AB (ref 0.30–0.70)
Heparin Unfractionated: 0.16 IU/mL — ABNORMAL LOW (ref 0.30–0.70)
Heparin Unfractionated: 0.1 IU/mL — ABNORMAL LOW (ref 0.30–0.70)

## 2015-11-07 MED ORDER — FUROSEMIDE 10 MG/ML IJ SOLN
40.0000 mg | Freq: Two times a day (BID) | INTRAMUSCULAR | Status: DC
  Administered 2015-11-07 (×2): 40 mg via INTRAVENOUS
  Filled 2015-11-07 (×2): qty 4

## 2015-11-07 MED ORDER — SODIUM CHLORIDE 0.9% FLUSH
10.0000 mL | Freq: Two times a day (BID) | INTRAVENOUS | Status: DC
  Administered 2015-11-07: 30 mL
  Administered 2015-11-07 – 2015-11-15 (×15): 10 mL

## 2015-11-07 MED ORDER — NOREPINEPHRINE BITARTRATE 1 MG/ML IV SOLN
2.0000 ug/min | INTRAVENOUS | Status: DC
  Administered 2015-11-07 – 2015-11-08 (×2): 5 ug/min via INTRAVENOUS
  Filled 2015-11-07 (×3): qty 4

## 2015-11-07 MED ORDER — SODIUM CHLORIDE 0.9% FLUSH
10.0000 mL | INTRAVENOUS | Status: DC | PRN
  Administered 2015-11-10: 10 mL
  Filled 2015-11-07: qty 40

## 2015-11-07 MED ORDER — POTASSIUM CHLORIDE CRYS ER 20 MEQ PO TBCR
40.0000 meq | EXTENDED_RELEASE_TABLET | Freq: Once | ORAL | Status: AC
  Administered 2015-11-07: 40 meq via ORAL
  Filled 2015-11-07: qty 2

## 2015-11-07 MED ORDER — GUAIFENESIN 100 MG/5ML PO SOLN
5.0000 mL | ORAL | Status: DC | PRN
  Administered 2015-11-07 – 2015-11-15 (×8): 100 mg via ORAL
  Filled 2015-11-07 (×8): qty 5

## 2015-11-07 NOTE — Progress Notes (Signed)
Peripherally Inserted Central Catheter/Midline Placement  The IV Nurse has discussed with the patient and/or persons authorized to consent for the patient, the purpose of this procedure and the potential benefits and risks involved with this procedure.  The benefits include less needle sticks, lab draws from the catheter and patient may be discharged home with the catheter.  Risks include, but not limited to, infection, bleeding, blood clot (thrombus formation), and puncture of an artery; nerve damage and irregular heat beat.  Alternatives to this procedure were also discussed.  PICC/Midline Placement Documentation  PICC Triple Lumen 11/07/15 PICC Right Brachial 42 cm 0 cm (Active)  Indication for Insertion or Continuance of Line Vasoactive infusions 11/07/2015  9:03 AM  Exposed Catheter (cm) 0 cm 11/07/2015  9:03 AM  Site Assessment Clean;Dry;Intact 11/07/2015  9:03 AM  Lumen #1 Status Flushed;Saline locked;Blood return noted 11/07/2015  9:03 AM  Lumen #2 Status Flushed;Saline locked;Blood return noted 11/07/2015  9:03 AM  Lumen #3 Status Flushed;Saline locked;Blood return noted 11/07/2015  9:03 AM  Dressing Type Transparent 11/07/2015  9:03 AM  Dressing Status Clean;Dry;Intact 11/07/2015  9:03 AM  Dressing Change Due 11/14/15 11/07/2015  9:03 AM       Ethelda Chick 11/07/2015, 9:04 AM

## 2015-11-07 NOTE — Progress Notes (Signed)
Patient ID: Joshua Larsen, male   DOB: [birth year], 65 y.o.   MRN: [id number]   SUBJECTIVE:  Patient is currently on dopamine at 5 with IABP at 1:1.  Waveform looks good.  Creatinine down to 2.24.  No chest pain or dyspnea.   Scheduled Meds: . antiseptic oral rinse  7 mL Mouth Rinse q12n4p  . aspirin  81 mg Oral Daily  . atorvastatin  80 mg Oral QPM  . chlorhexidine  15 mL Mouth Rinse BID  . furosemide  40 mg Intravenous BID  . insulin aspart  0-15 Units Subcutaneous TID WC  . pantoprazole  40 mg Intravenous Q12H  . potassium chloride  40 mEq Oral Once  . sodium chloride flush  3 mL Intravenous Q12H   Continuous Infusions: . amiodarone 30 mg/hr (11/06/15 2133)  . heparin 2,250 Units/hr (11/07/15 0421)  . norepinephrine (LEVOPHED) Adult infusion     PRN Meds:.sodium chloride, acetaminophen, ALPRAZolam, alum & mag hydroxide-simeth, morphine injection, nitroGLYCERIN, ondansetron (ZOFRAN) IV, sodium chloride flush    Filed Vitals:   11/07/15 0640 11/07/15 0645 11/07/15 0700 11/07/15 0715  BP:      Pulse: 87 176 80 73  Temp:      TempSrc:      Resp: 20 21 18 15   Height:      Weight:      SpO2: 97% 97% 98% 99%    Intake/Output Summary (Last 24 hours) at 11/07/15 0746 Last data filed at 11/07/15 0700  Gross per 24 hour  Intake 2650.33 ml  Output   1310 ml  Net 1340.33 ml    LABS: Basic Metabolic Panel:  Recent Labs  00/76/22 0543 11/07/15 0214  NA 139 141  K 4.3 4.0  CL 103 102  CO2 20* 22  GLUCOSE 151* 115*  BUN 32* 36*  CREATININE 2.46* 2.24*  CALCIUM 9.4 9.0   Liver Function Tests: No results for input(s): AST, ALT, ALKPHOS, BILITOT, PROT, ALBUMIN in the last 72 hours. No results for input(s): LIPASE, AMYLASE in the last 72 hours. CBC:  Recent Labs  11/06/15 0543 11/07/15 0214  WBC 16.8* 10.2  HGB 13.2 11.6*  HCT 40.1 36.5*  MCV 77.3* 78.7  PLT 217 168   Cardiac Enzymes:  Recent Labs  11/04/15 2126 11/05/15 1420 11/06/15 0543  CKTOTAL  --   713*  --   CKMB  --  86.4*  --   TROPONINI 5.92* 5.39* >65.00*   BNP: Invalid input(s): POCBNP D-Dimer: No results for input(s): DDIMER in the last 72 hours. Hemoglobin A1C:  Recent Labs  11/04/15 1811  HGBA1C 6.2*   Fasting Lipid Panel: No results for input(s): CHOL, HDL, LDLCALC, TRIG, CHOLHDL, LDLDIRECT in the last 72 hours. Thyroid Function Tests: No results for input(s): TSH, T4TOTAL, T3FREE, THYROIDAB in the last 72 hours.  Invalid input(s): FREET3 Anemia Panel: No results for input(s): VITAMINB12, FOLATE, FERRITIN, TIBC, IRON, RETICCTPCT in the last 72 hours.  RADIOLOGY: Dg Chest 1 View  11/05/2015  CLINICAL DATA:  Check balloon placement.  Cardiac catheterization EXAM: CHEST 1 VIEW COMPARISON:  11/04/2015 FINDINGS: Portable image in cardiac catheterization. There is a metal marker overlying the aortic arch compatible with intra-aortic balloon pump. Cardiac enlargement with CABG. Mild vascular congestion and mild interstitial edema. No pleural effusion. IMPRESSION: Intra-aortic balloon pump marker at the level the aortic arch Cardiac enlargement with mild heart failure and interstitial edema. These results were called by telephone at the time of interpretation on 11/05/2015 at 9:51 am to  Roseanne Reno in the cardiac cath lab who verbally acknowledged these results. Electronically Signed   By: Marlan Palau M.D.   On: 11/05/2015 09:52   Dg Chest 2 View  11/04/2015  CLINICAL DATA:  Left-sided chest pain beginning last night. History of prior myocardial infarction. Initial encounter. EXAM: CHEST  2 VIEW COMPARISON:  PA and lateral chest 06/18/2015.  CT chest 09/16/2014. FINDINGS: The patient is status post CABG. Heart size is upper normal. There is no pneumothorax or pleural effusion. No focal airspace disease is identified. Multilevel thoracic spondylosis is seen. Degenerative change is present about the shoulders. IMPRESSION: No acute disease. Electronically Signed   By: Drusilla Kanner M.D.   On: 11/04/2015 08:49   Dg Chest Port 1 View  11/07/2015  CLINICAL DATA:  Cough.  Dyspnea. EXAM: PORTABLE CHEST 1 VIEW COMPARISON:  11/05/2015 FINDINGS: There is unchanged cardiomegaly. The lungs are clear. There is no large effusion. Slight fullness of the pulmonary vasculature is unchanged and may be chronic. IMPRESSION: Unchanged cardiomegaly and slight vascular prominence. No interval change from 11/05/2015. Electronically Signed   By: Ellery Plunk M.D.   On: 11/07/2015 04:15   Dg Chest Port 1 View  11/05/2015  CLINICAL DATA:  Balloon pump pulled back. EXAM: PORTABLE CHEST 1 VIEW COMPARISON:  11/05/2015 FINDINGS: Sternotomy wires present. Intra aortic balloon pump is not significantly changed in position and remains over the expected region of the posterior arch at the level of the fourth posterior rib. Lungs are somewhat hypoinflated demonstrate worsening bilateral perihilar opacification right worse than left likely moderate interstitial edema. No evidence of effusion. Cardiomediastinal silhouette and remainder of the exam is unchanged. IMPRESSION: Worsening bilateral perihilar opacification right worse than left likely interstitial edema. Intra aortic balloon pump without significant change as tip appears overlying the expected region of the posterior arch at the level of the fourth posterior rib. Electronically Signed   By: Elberta Fortis M.D.   On: 11/05/2015 17:45   Dg Chest Port 1 View  11/05/2015  CLINICAL DATA:  Status post cardiac catheterization. History of stent placement in left coronary artery. EXAM: PORTABLE CHEST 1 VIEW COMPARISON:  November 05, 2015 FINDINGS: No pneumothorax. The metallic distal tip of the intra-aortic balloon pump terminates at the level of the aortic arch. The distal tip is approximately is 8 mm from the top of the arch. This is closer to the arch than expected. However, the finding is stable. Stable cardiomegaly. The hila and mediastinum are  unchanged. Increasing pulmonary edema. IMPRESSION: 1. The distal tip of the intra-aortic balloon pump is 8 mm from the top of the aortic arch, higher than expected. 2. Increasing pulmonary edema. These results will be called to the ordering clinician or representative by the Radiologist Assistant, and communication documented in the PACS or zVision Dashboard. Electronically Signed   By: Gerome Sam III M.D   On: 11/05/2015 13:04   Dg Chest Port 1v Same Day  11/05/2015  CLINICAL DATA:  Readjusted balloon pump. EXAM: PORTABLE CHEST 1 VIEW COMPARISON:  Chest x-ray from earlier same day. FINDINGS: Marker for the intra-aortic balloon pump is now positioned at the level of the carina. Cardiomegaly is stable. Central pulmonary vascular congestion and mild bilateral interstitial edema appears stable. No new lung findings. No pleural effusion. No pneumothorax seen. IMPRESSION: 1. Marker for intra-aortic balloon pump now positioned at the level of the carina. 2. Cardiomegaly with central pulmonary vascular congestion and bilateral interstitial edema suggesting CHF/volume overload, stable compared to the  exam from earlier today. Electronically Signed   By: Bary Richard M.D.   On: 11/05/2015 21:52   Dg Chest Port 1v Same Day  11/05/2015  CLINICAL DATA:  Intra-aortic balloon pump repositioning. Initial encounter. EXAM: PORTABLE CHEST 1 VIEW COMPARISON:  Chest radiograph performed earlier today at 5:22 p.m. FINDINGS: The patient's intra-aortic balloon pump tip is noted 1.5 cm below the level of the carina. Vascular congestion is again noted, with bilateral central airspace opacities, likely reflecting pulmonary edema, improved from the recent prior study. No definite pleural effusion or pneumothorax is seen, though the left costophrenic angle is incompletely imaged on this study. The cardiomediastinal silhouette is borderline normal in size. The patient is status post median sternotomy. No acute osseous abnormalities  are identified. IMPRESSION: 1. Intra-aortic balloon pump tip noted 1.5 cm below the level of the carina. 2. Vascular congestion, with bilateral central airspace opacities, reflecting pulmonary edema, improved from the recent prior study. Electronically Signed   By: Roanna Raider M.D.   On: 11/05/2015 21:23    PHYSICAL EXAM General: NAD Neck: Thick, JVP 10 cm, no thyromegaly or thyroid nodule.  Lungs: Crackles at bases.  CV: Nondisplaced PMI.  Heart regular S1/S2, no S3/S4, 1/6 SEM RUSB.  No peripheral edema.  No carotid bruit.  Dopplerable pedal pulses.  Abdomen: Soft, nontender, no hepatosplenomegaly, no distention.  Neurologic: Alert and oriented x 3.  Psych: Normal affect. Extremities: No clubbing or cyanosis. Left groin IABP site stable.   TELEMETRY: Reviewed telemetry pt in NSR  ASSESSMENT AND PLAN: 1. CAD: NSTEMI with hypotension and cardiac arrest. Pt presented to [facility] with progressive chest and back pain and ruled in for NSTEMI, troponin ultimately > 65. Cath 3/16 revealed severe multivessel native CAD and severe graft disease with 1/5 patent grafts (only LIMA-LAD patent) and presumed acute/subacute occlusion of the SVG  Diag1. He suffered a VF arrest during the cath with subsequent development of hypotension/chest pain requiring IABP placement and initiation of dopamine. No good redo surgical option (seen by Dr Donata Clay).  Cath films reviewed by Drs Kirke Corin, Croitoru, and Cooper over the past few days.  Very difficult interventional candidate but could possibly intervene on RCA.  Tentative plan for this on Monday.  - Continue ASA, statin, heparin (IABP).  - Tentative RCA intervention on Monday if creatinine remains stable.   2. Acute systolic CHF:  EF 30-35% on [facility] echo.  Cardiogenic shock on dopamine with IABP.  Volume overloaded on exam.  - Will place PICC to follow CVP and co-ox.  - Continue IABP at 1:1 today, will wean when hemodynamically stable and off pressors.   - Will  transition from dopamine to norepinephrine and then work on down-titrating the norepinephrine.   - Lasix 40 mg IV bid today.  3. AKI on CKD stage III: Creatinine 2.24 today, down from 2.4.  Baseline about 1.9.  Will follow closely, repeat cath will be relatively high risk for contrast-induced nephropathy.  4. OSA: Continue CPAP @ night.  40 minutes critical care time.   Marca Ancona 11/07/2015 7:55 AM

## 2015-11-07 NOTE — Progress Notes (Signed)
ANTICOAGULATION CONSULT NOTE - Follow Up Consult  Pharmacy Consult for Heparin Indication: CAD  Allergies  Allergen Reactions  . Amoxicillin   . Codeine     GI side effects  . Tagamet [Cimetidine] Other (See Comments)    Causes changes in mental status Causes changes in mental status    Patient Measurements: Height: 5\' 9"  (175.3 cm) Weight: (!) 315 lb 4.1 oz (143 kg) IBW/kg (Calculated) : 70.7 Heparin Dosing Weight: 104.8 kg  Vital Signs: Temp: 98.7 F (37.1 C) (03/18 2000) Temp Source: Oral (03/18 2000) BP: 95/85 mmHg (03/18 1900) Pulse Rate: 79 (03/18 2130)  Labs:  Recent Labs  11/05/15 0420 11/05/15 1420 11/06/15 0543 11/07/15 0214 11/07/15 1220 11/07/15 1300 11/07/15 2103  HGB 11.6*  --  13.2 11.6*  --   --   --   HCT 35.6*  --  40.1 36.5*  --   --   --   PLT 157  --  217 168  --   --   --   HEPARINUNFRC 0.44 0.36 0.30 0.16*  --  0.10* 0.10*  CREATININE 1.92*  --  2.46* 2.24* 2.24*  --   --   CKTOTAL  --  713*  --   --   --   --   --   CKMB  --  86.4*  --   --   --   --   --   TROPONINI  --  5.39* >65.00*  --   --   --   --     Estimated Creatinine Clearance: 46.9 mL/min (by C-G formula based on Cr of 2.24).   Assessment:  Anticoagulation:Heparin for acs now post cath with IABP HL 0.10 again (remains subtherapeutic), Hgb stable, Plt 168. No s/sx of bleeding noted per RN.  Goal of Therapy:  Heparin level 0.2-0.5 units/ml Monitor platelets by anticoagulation protocol: Yes   Plan:  - Will increase heparin to 2650 units/hr - Recheck HL and CBC in AM   Aleathea Pugmire S. Merilynn Finland, PharmD, BCPS Clinical Staff Pharmacist Pager [phone]  Misty Stanley Stillinger 11/07/2015,9:45 PM

## 2015-11-07 NOTE — Progress Notes (Signed)
ANTICOAGULATION CONSULT NOTE - Follow Up Consult  Pharmacy Consult for heparin Indication: chest pain/ACS and IABP in place  Allergies  Allergen Reactions  . Amoxicillin   . Codeine     GI side effects  . Tagamet [Cimetidine] Other (See Comments)    Causes changes in mental status Causes changes in mental status    Patient Measurements: Height: 5\' 9"  (175.3 cm) Weight: (!) 315 lb 4.1 oz (143 kg) IBW/kg (Calculated) : 70.7 Heparin Dosing Weight: 104.8 kg  Vital Signs: Temp: 99.6 F (37.6 C) (03/18 1200) Temp Source: Oral (03/18 1200) BP: 94/85 mmHg (03/18 1200) Pulse Rate: 71 (03/18 1200)  Labs:  Recent Labs  11/04/15 2126  11/05/15 0420 11/05/15 1420 11/06/15 0543 11/07/15 0214 11/07/15 1220 11/07/15 1300  HGB  --   < > 11.6*  --  13.2 11.6*  --   --   HCT  --   --  35.6*  --  40.1 36.5*  --   --   PLT  --   --  157  --  217 168  --   --   HEPARINUNFRC  --   --  0.44 0.36 0.30 0.16*  --  0.10*  CREATININE  --   < > 1.92*  --  2.46* 2.24* 2.24*  --   CKTOTAL  --   --   --  713*  --   --   --   --   CKMB  --   --   --  86.4*  --   --   --   --   TROPONINI 5.92*  --   --  5.39* >65.00*  --   --   --   < > = values in this interval not displayed.  Estimated Creatinine Clearance: 46.9 mL/min (by C-G formula based on Cr of 2.24).   Assessment: 65 yo M admitted 11/05/2015 with IABP placement on 3/16. Pharmacy consulted to dose heparin.   HL 0.10 (subtherapeutic), Hgb stable, Plt 168. No s/sx of bleeding noted per RN.  Goal of Therapy:  Heparin level 0.2-0.5 units/ml Monitor platelets by anticoagulation protocol: Yes   Plan:  - No Bolus - Will increase heparin to 2450 units/hr - Monitor 6 hr HL - Monitor daily HL, CBC and s/sx of bleeding  Casilda Carls, PharmD. PGY-1 Pharmacy Resident Pager: [phone] 11/07/2015,1:21 PM

## 2015-11-07 NOTE — Progress Notes (Signed)
ANTICOAGULATION CONSULT NOTE - Follow Up Consult  Pharmacy Consult for Heparin  Indication: chest pain/ACS, IABP in place  Allergies  Allergen Reactions  . Amoxicillin   . Codeine     GI side effects  . Tagamet [Cimetidine] Other (See Comments)    Causes changes in mental status Causes changes in mental status    Patient Measurements: Height: 5\' 9"  (175.3 cm) Weight: (!) 315 lb 4.1 oz (143 kg) IBW/kg (Calculated) : 70.7  Vital Signs: Temp: 100 F (37.8 C) (03/17 2314) Temp Source: Oral (03/17 2314) BP: 81/72 mmHg (03/17 1900) Pulse Rate: 89 (03/18 0300)  Labs:  Recent Labs  11/04/15 0802  11/04/15 2126 11/05/15 0420 11/05/15 1420 11/06/15 0543 11/07/15 0214  HGB  --   < >  --  11.6*  --  13.2 11.6*  HCT  --   < >  --  35.6*  --  40.1 36.5*  PLT  --   < >  --  157  --  217 168  APTT 34  --   --   --   --   --   --   LABPROT 14.6  --   --   --   --   --   --   INR 1.12  --   --   --   --   --   --   HEPARINUNFRC  --   < >  --  0.44 0.36 0.30 0.16*  CREATININE  --   < >  --  1.92*  --  2.46* 2.24*  CKTOTAL  --   --   --   --  713*  --   --   CKMB  --   --   --   --  86.4*  --   --   TROPONINI  --   < > 5.92*  --  5.39* >65.00*  --   < > = values in this interval not displayed.  Estimated Creatinine Clearance: 46.9 mL/min (by C-G formula based on Cr of 2.24).   Assessment: IABP in place, heparin level is sub-therapeutic this AM, no issues per RN.   Goal of Therapy:  Heparin level 0.2-0.5 units/ml Monitor platelets by anticoagulation protocol: Yes   Plan:  -Increase heparin to 2250 units/hr -1200 HL  Abran Duke 11/07/2015,4:19 AM

## 2015-11-07 NOTE — Progress Notes (Signed)
More cough noted on pt-lungs diminished w some rales noted-Dr Turner notified w/ order for chest xray.

## 2015-11-08 ENCOUNTER — Inpatient Hospital Stay (HOSPITAL_COMMUNITY): Payer: Medicare Other

## 2015-11-08 LAB — BASIC METABOLIC PANEL
ANION GAP: 14 (ref 5–15)
ANION GAP: 11 (ref 5–15)
BUN: 32 mg/dL — ABNORMAL HIGH (ref 6–20)
BUN: 34 mg/dL — ABNORMAL HIGH (ref 6–20)
CALCIUM: 8.9 mg/dL (ref 8.9–10.3)
CALCIUM: 8.6 mg/dL — AB (ref 8.9–10.3)
CO2: 24 mmol/L (ref 22–32)
CO2: 26 mmol/L (ref 22–32)
CREATININE: 2.08 mg/dL — AB (ref 0.61–1.24)
CREATININE: 2.13 mg/dL — AB (ref 0.61–1.24)
Chloride: 99 mmol/L — ABNORMAL LOW (ref 101–111)
Chloride: 100 mmol/L — ABNORMAL LOW (ref 101–111)
GFR, EST AFRICAN AMERICAN: 37 mL/min — AB (ref >60)
GFR, EST AFRICAN AMERICAN: 36 mL/min — AB (ref >60)
GFR, EST NON AFRICAN AMERICAN: 32 mL/min — AB (ref >60)
GFR, EST NON AFRICAN AMERICAN: 31 mL/min — AB (ref >60)
Glucose, Bld: 152 mg/dL — ABNORMAL HIGH (ref 65–99)
Glucose, Bld: 194 mg/dL — ABNORMAL HIGH (ref 65–99)
Potassium: 3.9 mmol/L (ref 3.5–5.1)
Potassium: 4.5 mmol/L (ref 3.5–5.1)
SODIUM: 137 mmol/L (ref 135–145)
Sodium: 137 mmol/L (ref 135–145)

## 2015-11-08 LAB — GLUCOSE, CAPILLARY
GLUCOSE-CAPILLARY: 149 mg/dL — AB (ref 65–99)
GLUCOSE-CAPILLARY: 151 mg/dL — AB (ref 65–99)
Glucose-Capillary: 120 mg/dL — ABNORMAL HIGH (ref 65–99)
Glucose-Capillary: 167 mg/dL — ABNORMAL HIGH (ref 65–99)

## 2015-11-08 LAB — POCT ACTIVATED CLOTTING TIME
Activated Clotting Time: 162 seconds
Activated Clotting Time: 152 seconds

## 2015-11-08 LAB — CBC
HEMATOCRIT: 36.9 % — AB (ref 39.0–52.0)
Hemoglobin: 11.7 g/dL — ABNORMAL LOW (ref 13.0–17.0)
MCH: 25.1 pg — AB (ref 26.0–34.0)
MCHC: 31.7 g/dL (ref 30.0–36.0)
MCV: 79.2 fL (ref 78.0–100.0)
PLATELETS: 177 10*3/uL (ref 150–400)
RBC: 4.66 MIL/uL (ref 4.22–5.81)
RDW: 16.1 % — AB (ref 11.5–15.5)
WBC: 11.1 10*3/uL — AB (ref 4.0–10.5)

## 2015-11-08 LAB — HEPARIN LEVEL (UNFRACTIONATED): Heparin Unfractionated: 0.15 IU/mL — ABNORMAL LOW (ref 0.30–0.70)

## 2015-11-08 LAB — CARBOXYHEMOGLOBIN
Carboxyhemoglobin: 2.2 % — ABNORMAL HIGH (ref 0.5–1.5)
Methemoglobin: 0.8 % (ref 0.0–1.5)
O2 Saturation: 71.1 %
Total hemoglobin: 12.4 g/dL — ABNORMAL LOW (ref 13.5–18.0)

## 2015-11-08 MED ORDER — SODIUM CHLORIDE 0.9% FLUSH
3.0000 mL | Freq: Two times a day (BID) | INTRAVENOUS | Status: DC
  Administered 2015-11-08: 3 mL via INTRAVENOUS

## 2015-11-08 MED ORDER — POTASSIUM CHLORIDE CRYS ER 20 MEQ PO TBCR
40.0000 meq | EXTENDED_RELEASE_TABLET | Freq: Once | ORAL | Status: AC
  Administered 2015-11-08: 40 meq via ORAL
  Filled 2015-11-08: qty 2

## 2015-11-08 MED ORDER — POTASSIUM CHLORIDE 10 MEQ/50ML IV SOLN: 10.0000 meq | INTRAVENOUS | Status: DC

## 2015-11-08 MED ORDER — SODIUM CHLORIDE 0.9 % IV SOLN: 250.0000 mL | INTRAVENOUS | Status: DC | PRN

## 2015-11-08 MED ORDER — SODIUM CHLORIDE 0.9 % IV SOLN
INTRAVENOUS | Status: DC
  Administered 2015-11-09: 06:00:00 via INTRAVENOUS

## 2015-11-08 MED ORDER — ASPIRIN 81 MG PO CHEW
81.0000 mg | CHEWABLE_TABLET | ORAL | Status: AC
  Administered 2015-11-09: 81 mg via ORAL
  Filled 2015-11-08: qty 1

## 2015-11-08 MED ORDER — FUROSEMIDE 10 MG/ML IJ SOLN
40.0000 mg | Freq: Once | INTRAMUSCULAR | Status: AC
  Administered 2015-11-08: 40 mg via INTRAVENOUS
  Filled 2015-11-08: qty 4

## 2015-11-08 MED ORDER — LIDOCAINE HCL (PF) 1 % IJ SOLN
INTRAMUSCULAR | Status: AC
  Filled 2015-11-08: qty 30

## 2015-11-08 MED ORDER — SODIUM CHLORIDE 0.9% FLUSH: 3.0000 mL | INTRAVENOUS | Status: DC | PRN

## 2015-11-08 NOTE — Progress Notes (Signed)
Removal of IABP right femoral atery without difficulty. 7Fr. IABP Pump and sheath removed intact and manual compression applied for 35 minutes without difficulty or complication. BP: 134 / 72, HR: 88, SP02 98%, Distal pulses present +1. Sterile 4x4 guaze applied to insertion site with steriel tegaderm to secure. Dressing dry and intact. Pateint given instructions and understands recovery. Nurse present and palpable right groin before departure.

## 2015-11-08 NOTE — Progress Notes (Signed)
Patient ID: Joshua Larsen, male   DOB: [birth year], 65 y.o.   MRN: [id number]   SUBJECTIVE:  Patient did well yesterday, diuresed considerably with weight down 8 lbs and CVP 9 this morning.  Co-ox 71.   He remains on norepinephrine 5 with IABP at 1:1.  Waveform looks good.  Creatinine down to 2.08.  No chest pain or dyspnea.   Scheduled Meds: . antiseptic oral rinse  7 mL Mouth Rinse q12n4p  . aspirin  81 mg Oral Daily  . atorvastatin  80 mg Oral QPM  . chlorhexidine  15 mL Mouth Rinse BID  . furosemide  40 mg Intravenous Once  . insulin aspart  0-15 Units Subcutaneous TID WC  . pantoprazole  40 mg Intravenous Q12H  . potassium chloride  40 mEq Oral Once  . sodium chloride flush  10-40 mL Intracatheter Q12H  . sodium chloride flush  3 mL Intravenous Q12H   Continuous Infusions: . amiodarone 30 mg/hr (11/07/15 2003)  . heparin 2,900 Units/hr (11/08/15 0530)  . norepinephrine (LEVOPHED) Adult infusion 5 mcg/min (11/08/15 0108)   PRN Meds:.sodium chloride, acetaminophen, ALPRAZolam, alum & mag hydroxide-simeth, guaiFENesin, morphine injection, nitroGLYCERIN, ondansetron (ZOFRAN) IV, sodium chloride flush, sodium chloride flush    Filed Vitals:   11/08/15 0428 11/08/15 0500 11/08/15 0600 11/08/15 0700  BP:      Pulse:  156 66 63  Temp:      TempSrc:      Resp:  20 18 17   Height:      Weight: 307 lb 5.1 oz (139.4 kg)     SpO2:  100% 98% 100%    Intake/Output Summary (Last 24 hours) at 11/08/15 0730 Last data filed at 11/08/15 0700  Gross per 24 hour  Intake 2842.84 ml  Output   5175 ml  Net -2332.16 ml    LABS: Basic Metabolic Panel:  Recent Labs  99/37/16 1220 11/08/15 0359  NA 137 137  K 3.8 3.9  CL 101 99*  CO2 24 24  GLUCOSE 195* 152*  BUN 36* 32*  CREATININE 2.24* 2.08*  CALCIUM 8.6* 8.9   Liver Function Tests: No results for input(s): AST, ALT, ALKPHOS, BILITOT, PROT, ALBUMIN in the last 72 hours. No results for input(s): LIPASE, AMYLASE in the last 72  hours. CBC:  Recent Labs  11/07/15 0214 11/08/15 0359  WBC 10.2 11.1*  HGB 11.6* 11.7*  HCT 36.5* 36.9*  MCV 78.7 79.2  PLT 168 177   Cardiac Enzymes:  Recent Labs  11/05/15 1420 11/06/15 0543  CKTOTAL 713*  --   CKMB 86.4*  --   TROPONINI 5.39* >65.00*   BNP: Invalid input(s): POCBNP D-Dimer: No results for input(s): DDIMER in the last 72 hours. Hemoglobin A1C: No results for input(s): HGBA1C in the last 72 hours. Fasting Lipid Panel: No results for input(s): CHOL, HDL, LDLCALC, TRIG, CHOLHDL, LDLDIRECT in the last 72 hours. Thyroid Function Tests: No results for input(s): TSH, T4TOTAL, T3FREE, THYROIDAB in the last 72 hours.  Invalid input(s): FREET3 Anemia Panel: No results for input(s): VITAMINB12, FOLATE, FERRITIN, TIBC, IRON, RETICCTPCT in the last 72 hours.  RADIOLOGY: Dg Chest 1 View  11/05/2015  CLINICAL DATA:  Check balloon placement.  Cardiac catheterization EXAM: CHEST 1 VIEW COMPARISON:  11/04/2015 FINDINGS: Portable image in cardiac catheterization. There is a metal marker overlying the aortic arch compatible with intra-aortic balloon pump. Cardiac enlargement with CABG. Mild vascular congestion and mild interstitial edema. No pleural effusion. IMPRESSION: Intra-aortic balloon pump marker at the  level the aortic arch Cardiac enlargement with mild heart failure and interstitial edema. These results were called by telephone at the time of interpretation on 11/05/2015 at 9:51 am to [facility] in the cardiac cath lab who verbally acknowledged these results. Electronically Signed   By: Marlan Palau M.D.   On: 11/05/2015 09:52   Dg Chest 2 View  11/04/2015  CLINICAL DATA:  Left-sided chest pain beginning last night. History of prior myocardial infarction. Initial encounter. EXAM: CHEST  2 VIEW COMPARISON:  PA and lateral chest 06/18/2015.  CT chest 09/16/2014. FINDINGS: The patient is status post CABG. Heart size is upper normal. There is no pneumothorax or pleural  effusion. No focal airspace disease is identified. Multilevel thoracic spondylosis is seen. Degenerative change is present about the shoulders. IMPRESSION: No acute disease. Electronically Signed   By: Drusilla Kanner M.D.   On: 11/04/2015 08:49   Dg Chest Port 1 View  11/07/2015  CLINICAL DATA:  Cough.  Dyspnea. EXAM: PORTABLE CHEST 1 VIEW COMPARISON:  11/05/2015 FINDINGS: There is unchanged cardiomegaly. The lungs are clear. There is no large effusion. Slight fullness of the pulmonary vasculature is unchanged and may be chronic. IMPRESSION: Unchanged cardiomegaly and slight vascular prominence. No interval change from 11/05/2015. Electronically Signed   By: Ellery Plunk M.D.   On: 11/07/2015 04:15   Dg Chest Port 1 View  11/05/2015  CLINICAL DATA:  Balloon pump pulled back. EXAM: PORTABLE CHEST 1 VIEW COMPARISON:  11/05/2015 FINDINGS: Sternotomy wires present. Intra aortic balloon pump is not significantly changed in position and remains over the expected region of the posterior arch at the level of the fourth posterior rib. Lungs are somewhat hypoinflated demonstrate worsening bilateral perihilar opacification right worse than left likely moderate interstitial edema. No evidence of effusion. Cardiomediastinal silhouette and remainder of the exam is unchanged. IMPRESSION: Worsening bilateral perihilar opacification right worse than left likely interstitial edema. Intra aortic balloon pump without significant change as tip appears overlying the expected region of the posterior arch at the level of the fourth posterior rib. Electronically Signed   By: Elberta Fortis M.D.   On: 11/05/2015 17:45   Dg Chest Port 1 View  11/05/2015  CLINICAL DATA:  Status post cardiac catheterization. History of stent placement in left coronary artery. EXAM: PORTABLE CHEST 1 VIEW COMPARISON:  November 05, 2015 FINDINGS: No pneumothorax. The metallic distal tip of the intra-aortic balloon pump terminates at the level of the  aortic arch. The distal tip is approximately is 8 mm from the top of the arch. This is closer to the arch than expected. However, the finding is stable. Stable cardiomegaly. The hila and mediastinum are unchanged. Increasing pulmonary edema. IMPRESSION: 1. The distal tip of the intra-aortic balloon pump is 8 mm from the top of the aortic arch, higher than expected. 2. Increasing pulmonary edema. These results will be called to the ordering clinician or representative by the Radiologist Assistant, and communication documented in the PACS or zVision Dashboard. Electronically Signed   By: Gerome Sam III M.D   On: 11/05/2015 13:04   Dg Chest Port 1v Same Day  11/05/2015  CLINICAL DATA:  Readjusted balloon pump. EXAM: PORTABLE CHEST 1 VIEW COMPARISON:  Chest x-ray from earlier same day. FINDINGS: Marker for the intra-aortic balloon pump is now positioned at the level of the carina. Cardiomegaly is stable. Central pulmonary vascular congestion and mild bilateral interstitial edema appears stable. No new lung findings. No pleural effusion. No pneumothorax seen. IMPRESSION: 1. Marker  for intra-aortic balloon pump now positioned at the level of the carina. 2. Cardiomegaly with central pulmonary vascular congestion and bilateral interstitial edema suggesting CHF/volume overload, stable compared to the exam from earlier today. Electronically Signed   By: Bary Richard M.D.   On: 11/05/2015 21:52   Dg Chest Port 1v Same Day  11/05/2015  CLINICAL DATA:  Intra-aortic balloon pump repositioning. Initial encounter. EXAM: PORTABLE CHEST 1 VIEW COMPARISON:  Chest radiograph performed earlier today at 5:22 p.m. FINDINGS: The patient's intra-aortic balloon pump tip is noted 1.5 cm below the level of the carina. Vascular congestion is again noted, with bilateral central airspace opacities, likely reflecting pulmonary edema, improved from the recent prior study. No definite pleural effusion or pneumothorax is seen, though the  left costophrenic angle is incompletely imaged on this study. The cardiomediastinal silhouette is borderline normal in size. The patient is status post median sternotomy. No acute osseous abnormalities are identified. IMPRESSION: 1. Intra-aortic balloon pump tip noted 1.5 cm below the level of the carina. 2. Vascular congestion, with bilateral central airspace opacities, reflecting pulmonary edema, improved from the recent prior study. Electronically Signed   By: Roanna Raider M.D.   On: 11/05/2015 21:23    PHYSICAL EXAM General: NAD Neck: Thick, JVP 8 cm, no thyromegaly or thyroid nodule.  Lungs: Crackles at bases.  CV: Nondisplaced PMI.  Heart regular S1/S2, no S3/S4, 1/6 SEM RUSB.  No peripheral edema.  No carotid bruit.  Dopplerable pedal pulses.  Abdomen: Soft, nontender, no hepatosplenomegaly, no distention.  Neurologic: Alert and oriented x 3.  Psych: Normal affect. Extremities: No clubbing or cyanosis. Left groin IABP site stable.   TELEMETRY: Reviewed telemetry pt in NSR  ASSESSMENT AND PLAN: 1. CAD: NSTEMI with hypotension and cardiac arrest. Pt presented to [facility] with progressive chest and back pain and ruled in for NSTEMI, troponin ultimately > 65. Cath 3/16 revealed severe multivessel native CAD and severe graft disease with 1/5 patent grafts (only LIMA-LAD patent) and presumed acute/subacute occlusion of the SVG  Diag1. He suffered a VF arrest during the cath with subsequent development of hypotension/chest pain requiring IABP placement and initiation of dopamine. No good redo surgical option (seen by Dr Donata Clay).  Cath films reviewed by Drs Kirke Corin, Croitoru, and Cooper over the past few days.  Very difficult interventional candidate but could possibly intervene on RCA.  Tentative plan for this on Monday.  - Continue ASA, statin, heparin (IABP).  - Tentative RCA intervention on Monday if creatinine remains stable => will make NPO tonight.   2. Acute systolic CHF:  EF 30-35% on  [facility] echo.  Cardiogenic shock on dopamine with IABP.  Volume status improved this morning with good UOP yesterday.  Weight is down. Co-ox 71% today with CVP 9.   - Will give one more dose of IV Lasix 40 mg this morning, then hold until after cardiac cath.  - Will start weaning norepinephrine this morning.  He is on a low dose and hopefully will come off easily.   - Once successfully off norepineprhine, will work at titrating off IABP => turn to 1:2 then 1:3 if tolerate this then shut off heparin gtt and remove. 3. AKI on CKD stage III: Creatinine 2.08 today, down from 2.24.  Baseline about 1.9.  Will follow closely, repeat cath will be relatively high risk for contrast-induced nephropathy. If creatinine is stable or continues to fall tomorrow, think would be ok to cath.  4. OSA: Continue CPAP @ night.  35 minutes critical care time.   Marca Ancona 11/08/2015 7:30 AM

## 2015-11-08 NOTE — Progress Notes (Signed)
eLink Nursing ICU Electrolyte Replacement Protocol  Patient Name: Joshua Larsen DOB: 08-15-1951 MRN: [id number]  Date of Service  11/08/2015   HPI/Events of Note    Recent Labs Lab 11/05/15 0420 11/06/15 0543 11/07/15 0214 11/07/15 1220 11/08/15 0359  NA 138 139 141 137 137  K 4.8 4.3 4.0 3.8 3.9  CL 107 103 102 101 99*  CO2 24 20* 22 24 24   GLUCOSE 122* 151* 115* 195* 152*  BUN 32* 32* 36* 36* 32*  CREATININE 1.92* 2.46* 2.24* 2.24* 2.08*  CALCIUM 8.9 9.4 9.0 8.6* 8.9    Estimated Creatinine Clearance: 49.8 mL/min (by C-G formula based on Cr of 2.08).  Intake/Output      03/18 0701 - 03/19 0700   P.O. 1435   I.V. (mL/kg) 1324.5 (9.5)   Total Intake(mL/kg) 2759.5 (19.8)   Urine (mL/kg/hr) 4725 (1.4)   Total Output 4725   Net -1965.5        - I/O DETAILED x24h    Total I/O In: 1074.7 [P.O.:475; I.V.:599.7] Out: 1475 [Urine:1475] - I/O THIS SHIFT    ASSESSMENT   eICURN Interventions  K+ replaced using protocol. MD informed.   ASSESSMENT: MAJOR ELECTROLYTE    Merita Norton 11/08/2015, 6:04 AM

## 2015-11-08 NOTE — Progress Notes (Signed)
ANTICOAGULATION CONSULT NOTE - Follow Up Consult  Pharmacy Consult for Heparin  Indication: chest pain/ACS, IABP in place  Allergies  Allergen Reactions  . Amoxicillin   . Codeine     GI side effects  . Tagamet [Cimetidine] Other (See Comments)    Causes changes in mental status Causes changes in mental status    Patient Measurements: Height: 5\' 9"  (175.3 cm) Weight: (!) 307 lb 5.1 oz (139.4 kg) IBW/kg (Calculated) : 70.7  Vital Signs: Temp: 98.5 F (36.9 C) (03/19 0400) Temp Source: Oral (03/19 0400) BP: 95/85 mmHg (03/18 1900) Pulse Rate: 156 (03/19 0500)  Labs:  Recent Labs  11/05/15 1420  11/06/15 0543 11/07/15 0214 11/07/15 1220 11/07/15 1300 11/07/15 2103 11/08/15 0359  HGB  --   < > 13.2 11.6*  --   --   --  11.7*  HCT  --   --  40.1 36.5*  --   --   --  36.9*  PLT  --   --  217 168  --   --   --  177  HEPARINUNFRC 0.36  --  0.30 0.16*  --  0.10* 0.10* 0.15*  CREATININE  --   < > 2.46* 2.24* 2.24*  --   --  2.08*  CKTOTAL 713*  --   --   --   --   --   --   --   CKMB 86.4*  --   --   --   --   --   --   --   TROPONINI 5.39*  --  >65.00*  --   --   --   --   --   < > = values in this interval not displayed.  Estimated Creatinine Clearance: 49.8 mL/min (by C-G formula based on Cr of 2.08).   Assessment: IABP in place, heparin level is sub-therapeutic this AM, no issues per RN.   Goal of Therapy:  Heparin level 0.2-0.5 units/ml Monitor platelets by anticoagulation protocol: Yes   Plan:  -Increase heparin to 2900 units/hr -1300 HL  Melisse Caetano 11/08/2015,5:28 AM

## 2015-11-09 ENCOUNTER — Encounter (HOSPITAL_COMMUNITY)
Admission: AD | Disposition: A | Discharge status: Home / Self Care | Payer: Self-pay | Source: Other Acute Inpatient Hospital | Attending: Cardiovascular Disease

## 2015-11-09 ENCOUNTER — Encounter (HOSPITAL_COMMUNITY): Payer: Self-pay | Admitting: Cardiology

## 2015-11-09 DIAGNOSIS — I25118 Atherosclerotic heart disease of native coronary artery with other forms of angina pectoris: Secondary | ICD-10-CM

## 2015-11-09 DIAGNOSIS — I2510 Atherosclerotic heart disease of native coronary artery without angina pectoris: Secondary | ICD-10-CM

## 2015-11-09 DIAGNOSIS — I214 Non-ST elevation (NSTEMI) myocardial infarction: Secondary | ICD-10-CM

## 2015-11-09 HISTORY — PX: CARDIAC CATHETERIZATION: SHX172

## 2015-11-09 LAB — CARBOXYHEMOGLOBIN
CARBOXYHEMOGLOBIN: 1.9 % — AB (ref 0.5–1.5)
METHEMOGLOBIN: 0.7 % (ref 0.0–1.5)
O2 Saturation: 62.2 %
Total hemoglobin: 12.4 g/dL — ABNORMAL LOW (ref 13.5–18.0)

## 2015-11-09 LAB — CBC
HEMATOCRIT: 35.9 % — AB (ref 39.0–52.0)
HEMATOCRIT: 35 % — AB (ref 39.0–52.0)
Hemoglobin: 11.5 g/dL — ABNORMAL LOW (ref 13.0–17.0)
Hemoglobin: 11.4 g/dL — ABNORMAL LOW (ref 13.0–17.0)
MCH: 25.4 pg — AB (ref 26.0–34.0)
MCH: 25.7 pg — AB (ref 26.0–34.0)
MCHC: 32 g/dL (ref 30.0–36.0)
MCHC: 32.6 g/dL (ref 30.0–36.0)
MCV: 79.4 fL (ref 78.0–100.0)
MCV: 78.8 fL (ref 78.0–100.0)
Platelets: 156 10*3/uL (ref 150–400)
Platelets: 160 10*3/uL (ref 150–400)
RBC: 4.52 MIL/uL (ref 4.22–5.81)
RBC: 4.44 MIL/uL (ref 4.22–5.81)
RDW: 16 % — ABNORMAL HIGH (ref 11.5–15.5)
RDW: 15.8 % — AB (ref 11.5–15.5)
WBC: 10.4 10*3/uL (ref 4.0–10.5)
WBC: 9.1 10*3/uL (ref 4.0–10.5)

## 2015-11-09 LAB — GLUCOSE, CAPILLARY
GLUCOSE-CAPILLARY: 205 mg/dL — AB (ref 65–99)
GLUCOSE-CAPILLARY: 155 mg/dL — AB (ref 65–99)
Glucose-Capillary: 109 mg/dL — ABNORMAL HIGH (ref 65–99)
Glucose-Capillary: 182 mg/dL — ABNORMAL HIGH (ref 65–99)

## 2015-11-09 LAB — BASIC METABOLIC PANEL
ANION GAP: 13 (ref 5–15)
BUN: 37 mg/dL — AB (ref 6–20)
CHLORIDE: 98 mmol/L — AB (ref 101–111)
CO2: 23 mmol/L (ref 22–32)
Calcium: 8.6 mg/dL — ABNORMAL LOW (ref 8.9–10.3)
Creatinine, Ser: 2.05 mg/dL — ABNORMAL HIGH (ref 0.61–1.24)
GFR calc Af Amer: 38 mL/min — ABNORMAL LOW (ref >60)
GFR calc non Af Amer: 33 mL/min — ABNORMAL LOW (ref >60)
GLUCOSE: 182 mg/dL — AB (ref 65–99)
POTASSIUM: 4.5 mmol/L (ref 3.5–5.1)
Sodium: 134 mmol/L — ABNORMAL LOW (ref 135–145)

## 2015-11-09 LAB — CREATININE, SERUM
Creatinine, Ser: 1.27 mg/dL — ABNORMAL HIGH (ref 0.61–1.24)
GFR calc Af Amer: >60 mL/min (ref >60)
GFR calc non Af Amer: 58 mL/min — ABNORMAL LOW (ref >60)

## 2015-11-09 LAB — POCT ACTIVATED CLOTTING TIME: Activated Clotting Time: 657 seconds

## 2015-11-09 SURGERY — CORONARY STENT INTERVENTION
Anesthesia: LOCAL

## 2015-11-09 MED ORDER — FENTANYL CITRATE (PF) 100 MCG/2ML IJ SOLN
INTRAMUSCULAR | Status: AC
Start: 2015-11-09 — End: 2015-11-09
  Filled 2015-11-09: qty 2

## 2015-11-09 MED ORDER — HEPARIN (PORCINE) IN NACL 2-0.9 UNIT/ML-% IJ SOLN
INTRAMUSCULAR | Status: DC | PRN
  Administered 2015-11-09: 1000 mL

## 2015-11-09 MED ORDER — SODIUM CHLORIDE 0.9% FLUSH
3.0000 mL | Freq: Two times a day (BID) | INTRAVENOUS | Status: DC
  Administered 2015-11-09 – 2015-11-15 (×6): 3 mL via INTRAVENOUS

## 2015-11-09 MED ORDER — BIVALIRUDIN 250 MG IV SOLR
INTRAVENOUS | Status: AC
  Filled 2015-11-09: qty 250

## 2015-11-09 MED ORDER — SODIUM CHLORIDE 0.9 % WEIGHT BASED INFUSION
1.0000 mL/kg/h | INTRAVENOUS | Status: AC
  Administered 2015-11-09: 1 mL/kg/h via INTRAVENOUS

## 2015-11-09 MED ORDER — TICAGRELOR 90 MG PO TABS
ORAL_TABLET | ORAL | Status: AC
  Filled 2015-11-09: qty 2

## 2015-11-09 MED ORDER — SODIUM CHLORIDE 0.9 % IV SOLN: 250.0000 mL | INTRAVENOUS | Status: DC | PRN

## 2015-11-09 MED ORDER — BIVALIRUDIN BOLUS VIA INFUSION - CUPID
INTRAVENOUS | Status: DC | PRN
  Administered 2015-11-09: 104.625 mg via INTRAVENOUS

## 2015-11-09 MED ORDER — MIDAZOLAM HCL 2 MG/2ML IJ SOLN
INTRAMUSCULAR | Status: AC
  Filled 2015-11-09: qty 2

## 2015-11-09 MED ORDER — ALTEPLASE 100 MG IV SOLR
2.0000 mg | Freq: Once | INTRAVENOUS | Status: AC
  Administered 2015-11-09: 2 mg
  Filled 2015-11-09: qty 2

## 2015-11-09 MED ORDER — SODIUM CHLORIDE 0.9 % IV SOLN
250.0000 mg | INTRAVENOUS | Status: DC | PRN
  Administered 2015-11-09 (×2): 1.75 mg/kg/h via INTRAVENOUS

## 2015-11-09 MED ORDER — TICAGRELOR 90 MG PO TABS
ORAL_TABLET | ORAL | Status: DC | PRN
  Administered 2015-11-09: 180 mg via ORAL

## 2015-11-09 MED ORDER — LIDOCAINE HCL (PF) 1 % IJ SOLN
INTRAMUSCULAR | Status: AC
  Filled 2015-11-09: qty 30

## 2015-11-09 MED ORDER — TICAGRELOR 90 MG PO TABS
90.0000 mg | ORAL_TABLET | Freq: Two times a day (BID) | ORAL | Status: DC
  Administered 2015-11-09 – 2015-11-15 (×13): 90 mg via ORAL
  Filled 2015-11-09 (×13): qty 1

## 2015-11-09 MED ORDER — SODIUM CHLORIDE 0.9% FLUSH: 3.0000 mL | INTRAVENOUS | Status: DC | PRN

## 2015-11-09 MED ORDER — VERAPAMIL HCL 2.5 MG/ML IV SOLN
INTRAVENOUS | Status: AC
  Filled 2015-11-09: qty 2

## 2015-11-09 MED ORDER — NITROGLYCERIN 1 MG/10 ML FOR IR/CATH LAB
INTRA_ARTERIAL | Status: DC | PRN
  Administered 2015-11-09: 200 ug via INTRACORONARY

## 2015-11-09 MED ORDER — METHYLPREDNISOLONE SODIUM SUCC 40 MG IJ SOLR
40.0000 mg | Freq: Once | INTRAMUSCULAR | Status: AC
  Administered 2015-11-09: 40 mg via INTRAVENOUS
  Filled 2015-11-09: qty 1

## 2015-11-09 MED ORDER — HEPARIN (PORCINE) IN NACL 2-0.9 UNIT/ML-% IJ SOLN
INTRAMUSCULAR | Status: AC
  Filled 2015-11-09: qty 1000

## 2015-11-09 MED ORDER — LIDOCAINE HCL (PF) 1 % IJ SOLN
INTRAMUSCULAR | Status: DC | PRN
  Administered 2015-11-09: 2 mL

## 2015-11-09 MED ORDER — NITROGLYCERIN 1 MG/10 ML FOR IR/CATH LAB
INTRA_ARTERIAL | Status: AC
  Filled 2015-11-09: qty 10

## 2015-11-09 MED ORDER — IOHEXOL 350 MG/ML SOLN
INTRAVENOUS | Status: DC | PRN
  Administered 2015-11-09: 120 mL via INTRAVENOUS

## 2015-11-09 MED ORDER — MIDAZOLAM HCL 2 MG/2ML IJ SOLN
INTRAMUSCULAR | Status: DC | PRN
  Administered 2015-11-09: 1 mg via INTRAVENOUS
  Administered 2015-11-09: 2 mg via INTRAVENOUS

## 2015-11-09 MED ORDER — ENOXAPARIN SODIUM 40 MG/0.4ML ~~LOC~~ SOLN
40.0000 mg | SUBCUTANEOUS | Status: DC
  Administered 2015-11-10: 40 mg via SUBCUTANEOUS
  Filled 2015-11-09: qty 0.4

## 2015-11-09 MED ORDER — VERAPAMIL HCL 2.5 MG/ML IV SOLN
INTRAVENOUS | Status: DC | PRN
  Administered 2015-11-09: 10 mL via INTRA_ARTERIAL

## 2015-11-09 MED ORDER — FENTANYL CITRATE (PF) 100 MCG/2ML IJ SOLN
INTRAMUSCULAR | Status: DC | PRN
  Administered 2015-11-09 (×2): 25 ug via INTRAVENOUS

## 2015-11-09 MED ORDER — SODIUM CHLORIDE 0.9 % IV SOLN
INTRAVENOUS | Status: DC | PRN
  Administered 2015-11-09: 10 mL/h via INTRAVENOUS

## 2015-11-09 SURGICAL SUPPLY — 19 items
BALLN EUPHORA RX 2.5X20 (BALLOONS) ×2
BALLN ~~LOC~~ EUPHORA RX 3.0X20 (BALLOONS) ×2
BALLN ~~LOC~~ TREK RX 4.0X20 (BALLOONS) ×1 IMPLANT
BALLOON EUPHORA RX 2.5X20 (BALLOONS) IMPLANT
BALLOON ~~LOC~~ EUPHORA RX 3.0X20 (BALLOONS) IMPLANT
GLIDESHEATH SLEND SS 6F .021 (SHEATH) ×1 IMPLANT
GUIDE CATH RUNWAY 6FR AL 1 (CATHETERS) ×1 IMPLANT
GUIDELINER 6F (CATHETERS) ×1 IMPLANT
HOVERMATT SINGLE USE (MISCELLANEOUS) ×1 IMPLANT
KIT ENCORE 26 ADVANTAGE (KITS) ×2 IMPLANT
KIT HEART LEFT (KITS) ×2 IMPLANT
PACK CARDIAC CATHETERIZATION (CUSTOM PROCEDURE TRAY) ×2 IMPLANT
STENT SYNERGY DES 2.5X38 (Permanent Stent) ×1 IMPLANT
STENT SYNERGY DES 3.5X38 (Permanent Stent) ×1 IMPLANT
STENT SYNERGY DES 4X12 (Permanent Stent) ×1 IMPLANT
TRANSDUCER W/STOPCOCK (MISCELLANEOUS) ×2 IMPLANT
TUBING CIL FLEX 10 FLL-RA (TUBING) ×1 IMPLANT
WIRE ASAHI PROWATER 180CM (WIRE) ×1 IMPLANT
WIRE SAFE-T 1.5MM-J .035X260CM (WIRE) ×1 IMPLANT

## 2015-11-09 NOTE — Care Management Important Message (Signed)
Important Message  Patient Details  Name: MORIO DUKART MRN: [id number] Date of Birth: [birth year]   Medicare Important Message Given:  Yes    Oralia Rud Jhaniya Briski 11/09/2015, 2:39 PM

## 2015-11-09 NOTE — Progress Notes (Signed)
PT Cancellation Note  Patient Details Name: Joshua Larsen MRN: [id number] DOB: 11-21-1950   Cancelled Treatment:    Reason Eval/Treat Not Completed: Patient at procedure or test/unavailable;Patient not medically ready (patient in cath lab)   Fabio Asa 11/09/2015, 10:09 AM Charlotte Crumb, PT DPT  [phone]

## 2015-11-09 NOTE — Interval H&P Note (Signed)
History and Physical Interval Note:  11/09/2015 9:17 AM  Joshua Larsen  has presented today for surgery, with the diagnosis of CAD  The various methods of treatment have been discussed with the patient and family. After consideration of risks, benefits and other options for treatment, the patient has consented to  Procedure(s): Coronary Stent Intervention (N/A) as a surgical intervention .  The patient's history has been reviewed, patient examined, no change in status, stable for surgery.  I have reviewed the patient's chart and labs.  Questions were answered to the patient's satisfaction.    Cath Lab Visit (complete for each Cath Lab visit)  Clinical Evaluation Leading to the Procedure:   ACS: Yes.    Non-ACS:    Anginal Classification: CCS IV  Anti-ischemic medical therapy: Maximal Therapy (2 or more classes of medications)  Non-Invasive Test Results: No non-invasive testing performed  Prior CABG: Previous CABG       Theron Arista [facility] 11/09/2015 9:17 AM

## 2015-11-09 NOTE — Care Management Note (Addendum)
Case Management Note  Patient Details  Name: Joshua Larsen MRN: [id number] Date of Birth: 1950/09/12  Subjective/Objective:     Adm w htn heart dis               Action/Plan: lives w fam   Expected Discharge Date:                  Expected Discharge Plan:  Home/Self Care  In-House Referral:     Discharge planning Services  CM Consult, Medication Assistance  Post Acute Care Choice:    Choice offered to:     DME Arranged:    DME Agency:     HH Arranged:    HH Agency:     Status of Service:     Medicare Important Message Given:    Date Medicare IM Given:    Medicare IM give by:    Date Additional Medicare IM Given:    Additional Medicare Important Message give by:     If discussed at Long Length of Stay Meetings, dates disS/W LADY @ [employer] # [phone]   BRILINTA 90 MG BID FOR 30 DAY SUPPLY   COVER- YES  CO-PAY - $ 47.00  ALSO SAME PRICE FOR MAIL ORDER  [employer]  PRIOR APPROVAL- NO  [facility]Y- [facility]:    Additional Comments: gave pt 30day free brilinta card.   Hanley Hays, RN 11/09/2015, 11:41 AM

## 2015-11-09 NOTE — H&P (View-Only) (Signed)
Patient ID: Joshua Larsen, male   DOB: [birth year], 65 y.o.   MRN: [id number]   SUBJECTIVE:  Patient did well yesterday, diuresed considerably with weight down 8 lbs and CVP 9 this morning.  Co-ox 71.   He remains on norepinephrine 5 with IABP at 1:1.  Waveform looks good.  Creatinine down to 2.08.  No chest pain or dyspnea.   Scheduled Meds: . antiseptic oral rinse  7 mL Mouth Rinse q12n4p  . aspirin  81 mg Oral Daily  . atorvastatin  80 mg Oral QPM  . chlorhexidine  15 mL Mouth Rinse BID  . furosemide  40 mg Intravenous Once  . insulin aspart  0-15 Units Subcutaneous TID WC  . pantoprazole  40 mg Intravenous Q12H  . potassium chloride  40 mEq Oral Once  . sodium chloride flush  10-40 mL Intracatheter Q12H  . sodium chloride flush  3 mL Intravenous Q12H   Continuous Infusions: . amiodarone 30 mg/hr (11/07/15 2003)  . heparin 2,900 Units/hr (11/08/15 0530)  . norepinephrine (LEVOPHED) Adult infusion 5 mcg/min (11/08/15 0108)   PRN Meds:.sodium chloride, acetaminophen, ALPRAZolam, alum & mag hydroxide-simeth, guaiFENesin, morphine injection, nitroGLYCERIN, ondansetron (ZOFRAN) IV, sodium chloride flush, sodium chloride flush    Filed Vitals:   11/08/15 0428 11/08/15 0500 11/08/15 0600 11/08/15 0700  BP:      Pulse:  156 66 63  Temp:      TempSrc:      Resp:  20 18 17   Height:      Weight: 307 lb 5.1 oz (139.4 kg)     SpO2:  100% 98% 100%    Intake/Output Summary (Last 24 hours) at 11/08/15 0730 Last data filed at 11/08/15 0700  Gross per 24 hour  Intake 2842.84 ml  Output   5175 ml  Net -2332.16 ml    LABS: Basic Metabolic Panel:  Recent Labs  99/37/16 1220 11/08/15 0359  NA 137 137  K 3.8 3.9  CL 101 99*  CO2 24 24  GLUCOSE 195* 152*  BUN 36* 32*  CREATININE 2.24* 2.08*  CALCIUM 8.6* 8.9   Liver Function Tests: No results for input(s): AST, ALT, ALKPHOS, BILITOT, PROT, ALBUMIN in the last 72 hours. No results for input(s): LIPASE, AMYLASE in the last 72  hours. CBC:  Recent Labs  11/07/15 0214 11/08/15 0359  WBC 10.2 11.1*  HGB 11.6* 11.7*  HCT 36.5* 36.9*  MCV 78.7 79.2  PLT 168 177   Cardiac Enzymes:  Recent Labs  11/05/15 1420 11/06/15 0543  CKTOTAL 713*  --   CKMB 86.4*  --   TROPONINI 5.39* >65.00*   BNP: Invalid input(s): POCBNP D-Dimer: No results for input(s): DDIMER in the last 72 hours. Hemoglobin A1C: No results for input(s): HGBA1C in the last 72 hours. Fasting Lipid Panel: No results for input(s): CHOL, HDL, LDLCALC, TRIG, CHOLHDL, LDLDIRECT in the last 72 hours. Thyroid Function Tests: No results for input(s): TSH, T4TOTAL, T3FREE, THYROIDAB in the last 72 hours.  Invalid input(s): FREET3 Anemia Panel: No results for input(s): VITAMINB12, FOLATE, FERRITIN, TIBC, IRON, RETICCTPCT in the last 72 hours.  RADIOLOGY: Dg Chest 1 View  11/05/2015  CLINICAL DATA:  Check balloon placement.  Cardiac catheterization EXAM: CHEST 1 VIEW COMPARISON:  11/04/2015 FINDINGS: Portable image in cardiac catheterization. There is a metal marker overlying the aortic arch compatible with intra-aortic balloon pump. Cardiac enlargement with CABG. Mild vascular congestion and mild interstitial edema. No pleural effusion. IMPRESSION: Intra-aortic balloon pump marker at the  level the aortic arch Cardiac enlargement with mild heart failure and interstitial edema. These results were called by telephone at the time of interpretation on 11/05/2015 at 9:51 am to [facility] in the cardiac cath lab who verbally acknowledged these results. Electronically Signed   By: Marlan Palau M.D.   On: 11/05/2015 09:52   Dg Chest 2 View  11/04/2015  CLINICAL DATA:  Left-sided chest pain beginning last night. History of prior myocardial infarction. Initial encounter. EXAM: CHEST  2 VIEW COMPARISON:  PA and lateral chest 06/18/2015.  CT chest 09/16/2014. FINDINGS: The patient is status post CABG. Heart size is upper normal. There is no pneumothorax or pleural  effusion. No focal airspace disease is identified. Multilevel thoracic spondylosis is seen. Degenerative change is present about the shoulders. IMPRESSION: No acute disease. Electronically Signed   By: Drusilla Kanner M.D.   On: 11/04/2015 08:49   Dg Chest Port 1 View  11/07/2015  CLINICAL DATA:  Cough.  Dyspnea. EXAM: PORTABLE CHEST 1 VIEW COMPARISON:  11/05/2015 FINDINGS: There is unchanged cardiomegaly. The lungs are clear. There is no large effusion. Slight fullness of the pulmonary vasculature is unchanged and may be chronic. IMPRESSION: Unchanged cardiomegaly and slight vascular prominence. No interval change from 11/05/2015. Electronically Signed   By: Ellery Plunk M.D.   On: 11/07/2015 04:15   Dg Chest Port 1 View  11/05/2015  CLINICAL DATA:  Balloon pump pulled back. EXAM: PORTABLE CHEST 1 VIEW COMPARISON:  11/05/2015 FINDINGS: Sternotomy wires present. Intra aortic balloon pump is not significantly changed in position and remains over the expected region of the posterior arch at the level of the fourth posterior rib. Lungs are somewhat hypoinflated demonstrate worsening bilateral perihilar opacification right worse than left likely moderate interstitial edema. No evidence of effusion. Cardiomediastinal silhouette and remainder of the exam is unchanged. IMPRESSION: Worsening bilateral perihilar opacification right worse than left likely interstitial edema. Intra aortic balloon pump without significant change as tip appears overlying the expected region of the posterior arch at the level of the fourth posterior rib. Electronically Signed   By: Elberta Fortis M.D.   On: 11/05/2015 17:45   Dg Chest Port 1 View  11/05/2015  CLINICAL DATA:  Status post cardiac catheterization. History of stent placement in left coronary artery. EXAM: PORTABLE CHEST 1 VIEW COMPARISON:  November 05, 2015 FINDINGS: No pneumothorax. The metallic distal tip of the intra-aortic balloon pump terminates at the level of the  aortic arch. The distal tip is approximately is 8 mm from the top of the arch. This is closer to the arch than expected. However, the finding is stable. Stable cardiomegaly. The hila and mediastinum are unchanged. Increasing pulmonary edema. IMPRESSION: 1. The distal tip of the intra-aortic balloon pump is 8 mm from the top of the aortic arch, higher than expected. 2. Increasing pulmonary edema. These results will be called to the ordering clinician or representative by the Radiologist Assistant, and communication documented in the PACS or zVision Dashboard. Electronically Signed   By: Gerome Sam III M.D   On: 11/05/2015 13:04   Dg Chest Port 1v Same Day  11/05/2015  CLINICAL DATA:  Readjusted balloon pump. EXAM: PORTABLE CHEST 1 VIEW COMPARISON:  Chest x-ray from earlier same day. FINDINGS: Marker for the intra-aortic balloon pump is now positioned at the level of the carina. Cardiomegaly is stable. Central pulmonary vascular congestion and mild bilateral interstitial edema appears stable. No new lung findings. No pleural effusion. No pneumothorax seen. IMPRESSION: 1. Marker  for intra-aortic balloon pump now positioned at the level of the carina. 2. Cardiomegaly with central pulmonary vascular congestion and bilateral interstitial edema suggesting CHF/volume overload, stable compared to the exam from earlier today. Electronically Signed   By: Bary Richard M.D.   On: 11/05/2015 21:52   Dg Chest Port 1v Same Day  11/05/2015  CLINICAL DATA:  Intra-aortic balloon pump repositioning. Initial encounter. EXAM: PORTABLE CHEST 1 VIEW COMPARISON:  Chest radiograph performed earlier today at 5:22 p.m. FINDINGS: The patient's intra-aortic balloon pump tip is noted 1.5 cm below the level of the carina. Vascular congestion is again noted, with bilateral central airspace opacities, likely reflecting pulmonary edema, improved from the recent prior study. No definite pleural effusion or pneumothorax is seen, though the  left costophrenic angle is incompletely imaged on this study. The cardiomediastinal silhouette is borderline normal in size. The patient is status post median sternotomy. No acute osseous abnormalities are identified. IMPRESSION: 1. Intra-aortic balloon pump tip noted 1.5 cm below the level of the carina. 2. Vascular congestion, with bilateral central airspace opacities, reflecting pulmonary edema, improved from the recent prior study. Electronically Signed   By: Roanna Raider M.D.   On: 11/05/2015 21:23    PHYSICAL EXAM General: NAD Neck: Thick, JVP 8 cm, no thyromegaly or thyroid nodule.  Lungs: Crackles at bases.  CV: Nondisplaced PMI.  Heart regular S1/S2, no S3/S4, 1/6 SEM RUSB.  No peripheral edema.  No carotid bruit.  Dopplerable pedal pulses.  Abdomen: Soft, nontender, no hepatosplenomegaly, no distention.  Neurologic: Alert and oriented x 3.  Psych: Normal affect. Extremities: No clubbing or cyanosis. Left groin IABP site stable.   TELEMETRY: Reviewed telemetry pt in NSR  ASSESSMENT AND PLAN: 1. CAD: NSTEMI with hypotension and cardiac arrest. Pt presented to [facility] with progressive chest and back pain and ruled in for NSTEMI, troponin ultimately > 65. Cath 3/16 revealed severe multivessel native CAD and severe graft disease with 1/5 patent grafts (only LIMA-LAD patent) and presumed acute/subacute occlusion of the SVG  Diag1. He suffered a VF arrest during the cath with subsequent development of hypotension/chest pain requiring IABP placement and initiation of dopamine. No good redo surgical option (seen by Dr Donata Clay).  Cath films reviewed by Drs Kirke Corin, Croitoru, and Cooper over the past few days.  Very difficult interventional candidate but could possibly intervene on RCA.  Tentative plan for this on Monday.  - Continue ASA, statin, heparin (IABP).  - Tentative RCA intervention on Monday if creatinine remains stable => will make NPO tonight.   2. Acute systolic CHF:  EF 30-35% on  [facility] echo.  Cardiogenic shock on dopamine with IABP.  Volume status improved this morning with good UOP yesterday.  Weight is down. Co-ox 71% today with CVP 9.   - Will give one more dose of IV Lasix 40 mg this morning, then hold until after cardiac cath.  - Will start weaning norepinephrine this morning.  He is on a low dose and hopefully will come off easily.   - Once successfully off norepineprhine, will work at titrating off IABP => turn to 1:2 then 1:3 if tolerate this then shut off heparin gtt and remove. 3. AKI on CKD stage III: Creatinine 2.08 today, down from 2.24.  Baseline about 1.9.  Will follow closely, repeat cath will be relatively high risk for contrast-induced nephropathy. If creatinine is stable or continues to fall tomorrow, think would be ok to cath.  4. OSA: Continue CPAP @ night.  35 minutes critical care time.   Marca Ancona 11/08/2015 7:30 AM

## 2015-11-09 NOTE — Progress Notes (Signed)
Pt complaining of 10/10 new onset left knee pain radiating to his left hip. Pain is unrelieved by PRN morphone, ice packs, or repositioning. He states that the pain began yesterday afternoon. He also states that he has a history of gout which he takes allopurinol for at home but hasn't taken any since his admission. According to pt, his flare ups usually occur in his toes and not his knees. Site is painful to the touch but doesn't appear reddened or swollen. Currently afebrile. Cardiology paged. Will continue to monitor.

## 2015-11-09 NOTE — Progress Notes (Signed)
Order received for 1 time dose 40mg  solumedrol.

## 2015-11-10 DIAGNOSIS — Z8739 Personal history of other diseases of the musculoskeletal system and connective tissue: Secondary | ICD-10-CM

## 2015-11-10 DIAGNOSIS — M25562 Pain in left knee: Secondary | ICD-10-CM

## 2015-11-10 LAB — CBC
HEMATOCRIT: 37.3 % — AB (ref 39.0–52.0)
HEMOGLOBIN: 11.9 g/dL — AB (ref 13.0–17.0)
MCH: 24.9 pg — AB (ref 26.0–34.0)
MCHC: 31.9 g/dL (ref 30.0–36.0)
MCV: 78.2 fL (ref 78.0–100.0)
PLATELETS: 207 10*3/uL (ref 150–400)
RBC: 4.77 MIL/uL (ref 4.22–5.81)
RDW: 15.6 % — ABNORMAL HIGH (ref 11.5–15.5)
WBC: 14.5 10*3/uL — ABNORMAL HIGH (ref 4.0–10.5)

## 2015-11-10 LAB — BASIC METABOLIC PANEL
Anion gap: 12 (ref 5–15)
BUN: 50 mg/dL — AB (ref 6–20)
CHLORIDE: 102 mmol/L (ref 101–111)
CO2: 22 mmol/L (ref 22–32)
Calcium: 9.1 mg/dL (ref 8.9–10.3)
Creatinine, Ser: 1.96 mg/dL — ABNORMAL HIGH (ref 0.61–1.24)
GFR calc Af Amer: 40 mL/min — ABNORMAL LOW (ref >60)
GFR calc non Af Amer: 34 mL/min — ABNORMAL LOW (ref >60)
GLUCOSE: 175 mg/dL — AB (ref 65–99)
POTASSIUM: 4.6 mmol/L (ref 3.5–5.1)
Sodium: 136 mmol/L (ref 135–145)

## 2015-11-10 LAB — GLUCOSE, CAPILLARY
GLUCOSE-CAPILLARY: 153 mg/dL — AB (ref 65–99)
Glucose-Capillary: 166 mg/dL — ABNORMAL HIGH (ref 65–99)
Glucose-Capillary: 186 mg/dL — ABNORMAL HIGH (ref 65–99)

## 2015-11-10 LAB — URIC ACID: URIC ACID, SERUM: 10 mg/dL — AB (ref 4.4–7.6)

## 2015-11-10 MED ORDER — TRAMADOL HCL 50 MG PO TABS
25.0000 mg | ORAL_TABLET | Freq: Two times a day (BID) | ORAL | Status: DC
  Administered 2015-11-10: 25 mg via ORAL
  Filled 2015-11-10: qty 1

## 2015-11-10 MED ORDER — PANTOPRAZOLE SODIUM 40 MG PO TBEC
40.0000 mg | DELAYED_RELEASE_TABLET | Freq: Two times a day (BID) | ORAL | Status: DC
  Administered 2015-11-10 – 2015-11-15 (×11): 40 mg via ORAL
  Filled 2015-11-10 (×11): qty 1

## 2015-11-10 MED ORDER — TRAMADOL HCL 50 MG PO TABS
100.0000 mg | ORAL_TABLET | Freq: Two times a day (BID) | ORAL | Status: DC
  Administered 2015-11-10 – 2015-11-15 (×10): 100 mg via ORAL
  Filled 2015-11-10 (×10): qty 2

## 2015-11-10 MED ORDER — ALTEPLASE 2 MG IJ SOLR: 2.0000 mg | Freq: Once | INTRAMUSCULAR | Status: DC

## 2015-11-10 NOTE — Progress Notes (Signed)
Pt with knee pain, with movement  He has not been on his outpt meds but his Cr has been elevated will hold, check uric acid and give ultram up to 100 mg. The 25 mg did nothing.

## 2015-11-10 NOTE — Progress Notes (Signed)
Pt with severe L knee pain with slight movement.PRN ultram this am for knee pain.  PT at bedside to work with patient. Pt wants his home regmine of allopurinol and pain medicine. Notied PA.

## 2015-11-10 NOTE — Progress Notes (Signed)
Patient Name:  Joshua Larsen, DOB: [birth year], MRN: [id number] Primary Doctor: Megan Mans, MD Primary Cardiologist:   Date: 11/10/2015   SUBJECTIVE:  Patient underwent successful complex PCI to the right coronary artery yesterday. He received 3 drug-eluting stents. Procedure was done through the right radial and he is stable. He is not having any chest pain. His only complaint at this time is inability to straighten his left leg. He does have some discomfort in the knee. This symptom does not seem to be consistent with his prior history of gout that affected his hands.   Past Medical History  Diagnosis Date  . Hypertensive heart disease   . CAD (coronary artery disease)     a. 08/2000 s/p CABG x 5 (LIMA->LAD, VG->Diag, VG->dRCA->RPL, VG->OM); b. 07/2014 PCI VG->OM, VG->Diag 100, VG->PDA->RPL 100;  c. 08/2014 Cath: patent stent in VG->OM1->Med Rx; d. 10/2015 NSTEMI/VF arrest/CG Shock.  Marland Kitchen GERD (gastroesophageal reflux disease)   . CKD (chronic kidney disease), stage III   . Sleep apnea     a. On CPAP.  Marland Kitchen Chronic stable angina (HCC)   . Diet-controlled diabetes mellitus (HCC)    Filed Vitals:   11/10/15 0348 11/10/15 0400 11/10/15 0505 11/10/15 0600  BP: 117/85 125/75 103/70 109/88  Pulse: 71 73 74 83  Temp: 98.4 F (36.9 C)     TempSrc: Oral     Resp: 19 21 17 19   Height:      Weight: 303 lb 9.2 oz (137.7 kg)     SpO2: 100% 100% 97% 100%    Intake/Output Summary (Last 24 hours) at 11/10/15 0746 Last data filed at 11/10/15 0100  Gross per 24 hour  Intake 1026.7 ml  Output   1200 ml  Net -173.3 ml   Filed Weights   11/08/15 0428 11/09/15 0500 11/10/15 0348  Weight: 307 lb 5.1 oz (139.4 kg) 307 lb 8.7 oz (139.5 kg) 303 lb 9.2 oz (137.7 kg)     LABS: Basic Metabolic Panel:  Recent Labs  46/96/29 0520 11/09/15 1145 11/10/15 0534  NA 134*  --  136  K 4.5  --  4.6  CL 98*  --  102  CO2 23  --  22  GLUCOSE 182*  --  175*  BUN 37*  --  50*  CREATININE  2.05* 1.27* 1.96*  CALCIUM 8.6*  --  9.1   Liver Function Tests: No results for input(s): AST, ALT, ALKPHOS, BILITOT, PROT, ALBUMIN in the last 72 hours. No results for input(s): LIPASE, AMYLASE in the last 72 hours. CBC:  Recent Labs  11/09/15 1145 11/10/15 0534  WBC 9.1 14.5*  HGB 11.4* 11.9*  HCT 35.0* 37.3*  MCV 78.8 78.2  PLT 160 207   Cardiac Enzymes: No results for input(s): CKTOTAL, CKMB, CKMBINDEX, TROPONINI in the last 72 hours. BNP: Invalid input(s): POCBNP D-Dimer: No results for input(s): DDIMER in the last 72 hours. Thyroid Function Tests: No results for input(s): TSH, T4TOTAL, T3FREE, THYROIDAB in the last 72 hours.  Invalid input(s): FREET3  RADIOLOGY: Dg Chest 1 View  11/05/2015  CLINICAL DATA:  Check balloon placement.  Cardiac catheterization EXAM: CHEST 1 VIEW COMPARISON:  11/04/2015 FINDINGS: Portable image in cardiac catheterization. There is a metal marker overlying the aortic arch compatible with intra-aortic balloon pump. Cardiac enlargement with CABG. Mild vascular congestion and mild interstitial edema. No pleural effusion. IMPRESSION: Intra-aortic balloon pump marker at the level the aortic arch Cardiac enlargement with mild heart failure and  interstitial edema. These results were called by telephone at the time of interpretation on 11/05/2015 at 9:51 am to [facility] in the cardiac cath lab who verbally acknowledged these results. Electronically Signed   By: Marlan Palau M.D.   On: 11/05/2015 09:52   Dg Chest 2 View  11/04/2015  CLINICAL DATA:  Left-sided chest pain beginning last night. History of prior myocardial infarction. Initial encounter. EXAM: CHEST  2 VIEW COMPARISON:  PA and lateral chest 06/18/2015.  CT chest 09/16/2014. FINDINGS: The patient is status post CABG. Heart size is upper normal. There is no pneumothorax or pleural effusion. No focal airspace disease is identified. Multilevel thoracic spondylosis is seen. Degenerative change is  present about the shoulders. IMPRESSION: No acute disease. Electronically Signed   By: Drusilla Kanner M.D.   On: 11/04/2015 08:49   Dg Chest Port 1 View  11/08/2015  CLINICAL DATA:  CHF EXAM: PORTABLE CHEST 1 VIEW COMPARISON:  Chest radiograph from one day prior. FINDINGS: Sternotomy wires appear aligned and intact. Right PICC terminates at the cavoatrial junction. Stable cardiomediastinal silhouette with mild cardiomegaly. No pneumothorax. No pleural effusion. Moderate pulmonary edema, stable slightly worsened. IMPRESSION: Moderate congestive heart failure, stable to slightly worsened. Electronically Signed   By: Delbert Phenix M.D.   On: 11/08/2015 07:58   Dg Chest Port 1 View  11/07/2015  CLINICAL DATA:  Cough.  Dyspnea. EXAM: PORTABLE CHEST 1 VIEW COMPARISON:  11/05/2015 FINDINGS: There is unchanged cardiomegaly. The lungs are clear. There is no large effusion. Slight fullness of the pulmonary vasculature is unchanged and may be chronic. IMPRESSION: Unchanged cardiomegaly and slight vascular prominence. No interval change from 11/05/2015. Electronically Signed   By: Ellery Plunk M.D.   On: 11/07/2015 04:15   Dg Chest Port 1 View  11/05/2015  CLINICAL DATA:  Balloon pump pulled back. EXAM: PORTABLE CHEST 1 VIEW COMPARISON:  11/05/2015 FINDINGS: Sternotomy wires present. Intra aortic balloon pump is not significantly changed in position and remains over the expected region of the posterior arch at the level of the fourth posterior rib. Lungs are somewhat hypoinflated demonstrate worsening bilateral perihilar opacification right worse than left likely moderate interstitial edema. No evidence of effusion. Cardiomediastinal silhouette and remainder of the exam is unchanged. IMPRESSION: Worsening bilateral perihilar opacification right worse than left likely interstitial edema. Intra aortic balloon pump without significant change as tip appears overlying the expected region of the posterior arch at the  level of the fourth posterior rib. Electronically Signed   By: Elberta Fortis M.D.   On: 11/05/2015 17:45   Dg Chest Port 1 View  11/05/2015  CLINICAL DATA:  Status post cardiac catheterization. History of stent placement in left coronary artery. EXAM: PORTABLE CHEST 1 VIEW COMPARISON:  November 05, 2015 FINDINGS: No pneumothorax. The metallic distal tip of the intra-aortic balloon pump terminates at the level of the aortic arch. The distal tip is approximately is 8 mm from the top of the arch. This is closer to the arch than expected. However, the finding is stable. Stable cardiomegaly. The hila and mediastinum are unchanged. Increasing pulmonary edema. IMPRESSION: 1. The distal tip of the intra-aortic balloon pump is 8 mm from the top of the aortic arch, higher than expected. 2. Increasing pulmonary edema. These results will be called to the ordering clinician or representative by the Radiologist Assistant, and communication documented in the PACS or zVision Dashboard. Electronically Signed   By: Gerome Sam III M.D   On: 11/05/2015 13:04   Dg  Chest Port 1v Same Day  11/05/2015  CLINICAL DATA:  Readjusted balloon pump. EXAM: PORTABLE CHEST 1 VIEW COMPARISON:  Chest x-ray from earlier same day. FINDINGS: Marker for the intra-aortic balloon pump is now positioned at the level of the carina. Cardiomegaly is stable. Central pulmonary vascular congestion and mild bilateral interstitial edema appears stable. No new lung findings. No pleural effusion. No pneumothorax seen. IMPRESSION: 1. Marker for intra-aortic balloon pump now positioned at the level of the carina. 2. Cardiomegaly with central pulmonary vascular congestion and bilateral interstitial edema suggesting CHF/volume overload, stable compared to the exam from earlier today. Electronically Signed   By: Bary Richard M.D.   On: 11/05/2015 21:52   Dg Chest Port 1v Same Day  11/05/2015  CLINICAL DATA:  Intra-aortic balloon pump repositioning. Initial  encounter. EXAM: PORTABLE CHEST 1 VIEW COMPARISON:  Chest radiograph performed earlier today at 5:22 p.m. FINDINGS: The patient's intra-aortic balloon pump tip is noted 1.5 cm below the level of the carina. Vascular congestion is again noted, with bilateral central airspace opacities, likely reflecting pulmonary edema, improved from the recent prior study. No definite pleural effusion or pneumothorax is seen, though the left costophrenic angle is incompletely imaged on this study. The cardiomediastinal silhouette is borderline normal in size. The patient is status post median sternotomy. No acute osseous abnormalities are identified. IMPRESSION: 1. Intra-aortic balloon pump tip noted 1.5 cm below the level of the carina. 2. Vascular congestion, with bilateral central airspace opacities, reflecting pulmonary edema, improved from the recent prior study. Electronically Signed   By: Roanna Raider M.D.   On: 11/05/2015 21:23    PHYSICAL EXAM  the patient is obese. He is oriented to person time and place. Affect is normal. Lungs reveal scattered rhonchi. Right radial cath site is stable. Right groin aortic balloon pump site is stable. There is no significant peripheral edema.   TELEMETRY: I have reviewed telemetry today November 10, 2015. There is normal sinus rhythm.   ASSESSMENT AND PLAN:  1. CAD: NSTEMI with hypotension and cardiac arrest. Pt presented to [facility] with progressive chest and back pain and ruled in for NSTEMI, troponin ultimately > 65. Cath 3/16 revealed severe multivessel native CAD and severe graft disease with 1/5 patent grafts (only LIMA-LAD patent) and presumed acute/subacute occlusion of the SVG  Diag1. He suffered a VF arrest during the cath with subsequent development of hypotension/chest pain requiring IABP placement and initiation of dopamine. No good redo surgical option (seen by Dr Donata Clay). Cath films reviewed by Drs Kirke Corin, Croitoru, and Cooper over the past few days. Very  difficult interventional candidate but could possibly intervene on RCA.    The patient then underwent successful complex PCI to the RCA receiving 3 drug-eluting stents on November 09, 2015. On November 10, 2015 he is now stable. The right radial cath site is stable. Intra-aortic balloon had been taken out and the site is stable. No further cardiac procedures are planned at this time. Physical therapy has been consulted to help him increase activity and also help assess his left knee.    He is on aspirin, atorvastatin,Brilinta.  2. Acute systolic CHF: EF 40-98% on [facility] echo. Cardiogenic shock on dopamine with IABP.      The balloon pump has been removed. His volume status is stable today.  3. AKI on CKD stage III: Creatinine at baseline is 1.9. The lab from yesterday before the cath revealed a creatinine of 1.3. Today creatinine is 1.9. This will be followed  carefully to be sure that he does not have significant increase following his catheterization. For today no plan to push diuretics.  4. OSA: Continue CPAP @ night.    Left knee pain     Etiology of the left knee discomfort is not clear. His symptoms do not seem to be compatible with his prior gout. This affected his hands. He says he cannot straighten his left knee. We will proceed with physical therapy consult today. I have decided to use a small dose of tramadol today.    History of gout     Historically he has been on 100 mg of allopurinol. I've chosen not to restart this today until we are sure his renal function is stable. I've chosen not to use colchicine today because I still think his knee pain is not gout.  Willa Rough 11/10/2015 7:46 AM

## 2015-11-10 NOTE — Evaluation (Signed)
Physical Therapy Evaluation Patient Details Name: Joshua Larsen MRN: [id number] DOB: Dec 24, 1950 Today's Date: 11/10/2015   History of Present Illness  : Patient underwent successful complex PCI to the right coronary artery yesterday. He received 3 drug-eluting stents. Procedure was done through the right radial and he is stable. He is not having any chest pain. His only complaint at this time is inability to straighten his left leg. He does have some discomfort in the knee. This symptom does not seem to be consistent with his prior history of gout that affected his hands.  Clinical Impression  Pt admitted with above diagnosis. Pt currently with functional limitations due to the deficits listed below (see PT Problem List). Pt was able to ambulate with RW to bathroom and back. LEft LE improving with movement.  Placed ice on pt knee as well as it was swollen slightly.  Will follow acutely.   Pt will benefit from skilled PT to increase their independence and safety with mobility to allow discharge to the venue listed below.     Follow Up Recommendations Home health PT;Supervision - Intermittent    Equipment Recommendations  Rolling walker with 5" wheels;3in1 (PT) (will need these if left LE pain persists)    Recommendations for Other Services       Precautions / Restrictions Precautions Precautions: Fall Restrictions Weight Bearing Restrictions: No      Mobility  Bed Mobility Overal bed mobility: Independent                Transfers Overall transfer level: Needs assistance Equipment used: Rolling walker (2 wheeled) Transfers: Sit to/from Stand Sit to Stand: Min assist;+2 safety/equipment         General transfer comment: Had a second person for safety as pt fearful.  Once pt up, he began to get more and more steady. He stood for about 5 minutes and was slowly gettiting weight on left LE until he progressed and was able to ambulate on second attempt at standing.    Ambulation/Gait Ambulation/Gait assistance: Min guard;+2 safety/equipment Ambulation Distance (Feet): 40 Feet (20 feet x 2) Assistive device: Rolling walker (2 wheeled) Gait Pattern/deviations: Decreased stride length;Step-to pattern;Decreased step length - left;Decreased stance time - left;Decreased weight shift to left;Antalgic   Gait velocity interpretation: Below normal speed for age/gender General Gait Details: Pt decided to ambulate into bathroom and then back to bed.  Good sequence overall.  Probably  weight bearing at least 30-40% on the left LE.   Stairs            Wheelchair Mobility    Modified Rankin (Stroke Patients Only)       Balance Overall balance assessment: Needs assistance         Standing balance support: Bilateral upper extremity supported;During functional activity Standing balance-Leahy Scale: Poor Standing balance comment: requiring RW for stability in standing today.                              Pertinent Vitals/Pain Pain Assessment: 0-10 Pain Score: 8  Pain Location: left knee Pain Descriptors / Indicators: Aching;Grimacing;Guarding;Shooting;Sharp Pain Intervention(s): Limited activity within patient's tolerance;Monitored during session;Premedicated before session;Repositioned;Ice applied  VSS    Home Living Family/patient expects to be discharged to:: Private residence Living Arrangements: Alone Available Help at Discharge: Available PRN/intermittently Type of Home: House Home Access: Stairs to enter Entrance Stairs-Rails: Can reach both Entrance Stairs-Number of Steps: 4 Home Layout: One level Home Equipment:  None      Prior Function Level of Independence: Independent               Hand Dominance        Extremity/Trunk Assessment   Upper Extremity Assessment: Defer to OT evaluation           Lower Extremity Assessment: LLE deficits/detail   LLE Deficits / Details: grossly 3/5 with pt able to  weight bear and ambulate with some pain  Cervical / Trunk Assessment: Normal  Communication   Communication: No difficulties  Cognition Arousal/Alertness: Awake/alert Behavior During Therapy: Anxious Overall Cognitive Status: Within Functional Limits for tasks assessed                      General Comments General comments (skin integrity, edema, etc.): Noted redness and edema left LE.      Exercises General Exercises - Lower Extremity Ankle Circles/Pumps: AROM;Both;5 reps;Seated Long Arc Quad: AROM;Both;5 reps;Seated      Assessment/Plan    PT Assessment Patient needs continued PT services  PT Diagnosis Generalized weakness;Acute pain   PT Problem List Decreased balance;Decreased mobility;Decreased activity tolerance;Decreased strength;Decreased range of motion;Decreased knowledge of use of DME;Decreased safety awareness;Decreased knowledge of precautions;Other (comment);Pain  PT Treatment Interventions DME instruction;Gait training;Functional mobility training;Therapeutic activities;Therapeutic exercise;Balance training;Patient/family education;Stair training   PT Goals (Current goals can be found in the Care Plan section) Acute Rehab PT Goals Patient Stated Goal: to get rid of left LE pain PT Goal Formulation: With patient Time For Goal Achievement: 11/24/15 Potential to Achieve Goals: Good    Frequency Min 3X/week   Barriers to discharge Decreased caregiver support      Co-evaluation               End of Session Equipment Utilized During Treatment: Gait belt Activity Tolerance: Patient limited by fatigue;Patient limited by pain Patient left: in bed;with call bell/phone within reach Nurse Communication: Mobility status;Patient requests pain meds         Time: 1115-1205 PT Time Calculation (min) (ACUTE ONLY): 50 min   Charges:   PT Evaluation $PT Eval Moderate Complexity: 1 Procedure PT Treatments $Gait Training: 8-22 mins $Therapeutic  Exercise: 8-22 mins   PT G CodesBerline Larsen 2015/11/16, 1:04 PM Joshua Larsen,PT Acute Rehabilitation [phone] (pager)

## 2015-11-11 ENCOUNTER — Inpatient Hospital Stay (HOSPITAL_COMMUNITY): Payer: Medicare Other

## 2015-11-11 LAB — GLUCOSE, CAPILLARY
GLUCOSE-CAPILLARY: 165 mg/dL — AB (ref 65–99)
GLUCOSE-CAPILLARY: 141 mg/dL — AB (ref 65–99)
Glucose-Capillary: 125 mg/dL — ABNORMAL HIGH (ref 65–99)
Glucose-Capillary: 110 mg/dL — ABNORMAL HIGH (ref 65–99)
Glucose-Capillary: 91 mg/dL (ref 65–99)

## 2015-11-11 LAB — BASIC METABOLIC PANEL
ANION GAP: 6 (ref 5–15)
BUN: 44 mg/dL — ABNORMAL HIGH (ref 6–20)
CO2: 25 mmol/L (ref 22–32)
Calcium: 8.7 mg/dL — ABNORMAL LOW (ref 8.9–10.3)
Chloride: 103 mmol/L (ref 101–111)
Creatinine, Ser: 1.73 mg/dL — ABNORMAL HIGH (ref 0.61–1.24)
GFR calc Af Amer: 46 mL/min — ABNORMAL LOW (ref >60)
GFR, EST NON AFRICAN AMERICAN: 40 mL/min — AB (ref >60)
Glucose, Bld: 180 mg/dL — ABNORMAL HIGH (ref 65–99)
POTASSIUM: 4.2 mmol/L (ref 3.5–5.1)
SODIUM: 134 mmol/L — AB (ref 135–145)

## 2015-11-11 LAB — CBC
HCT: 36.6 % — ABNORMAL LOW (ref 39.0–52.0)
Hemoglobin: 11.7 g/dL — ABNORMAL LOW (ref 13.0–17.0)
MCH: 25.3 pg — ABNORMAL LOW (ref 26.0–34.0)
MCHC: 32 g/dL (ref 30.0–36.0)
MCV: 79 fL (ref 78.0–100.0)
PLATELETS: 183 10*3/uL (ref 150–400)
RBC: 4.63 MIL/uL (ref 4.22–5.81)
RDW: 15.9 % — ABNORMAL HIGH (ref 11.5–15.5)
WBC: 10 10*3/uL (ref 4.0–10.5)

## 2015-11-11 MED ORDER — ALLOPURINOL 100 MG PO TABS
100.0000 mg | ORAL_TABLET | Freq: Every day | ORAL | Status: DC
  Administered 2015-11-11 – 2015-11-15 (×5): 100 mg via ORAL
  Filled 2015-11-11 (×5): qty 1

## 2015-11-11 MED ORDER — LIDOCAINE HCL 2 % IJ SOLN
20.0000 mL | Freq: Once | INTRAMUSCULAR | Status: DC
  Filled 2015-11-11: qty 20

## 2015-11-11 MED ORDER — BUPIVACAINE HCL 0.5 % IJ SOLN
50.0000 mL | Freq: Once | INTRAMUSCULAR | Status: DC
  Filled 2015-11-11: qty 50

## 2015-11-11 MED ORDER — ENOXAPARIN SODIUM 80 MG/0.8ML ~~LOC~~ SOLN
70.0000 mg | SUBCUTANEOUS | Status: DC
  Administered 2015-11-11 – 2015-11-15 (×5): 70 mg via SUBCUTANEOUS
  Filled 2015-11-11 (×6): qty 0.8

## 2015-11-11 MED ORDER — COLCHICINE 0.6 MG PO TABS
0.6000 mg | ORAL_TABLET | Freq: Every day | ORAL | Status: DC
  Administered 2015-11-11: 0.6 mg via ORAL
  Filled 2015-11-11: qty 1

## 2015-11-11 MED ORDER — METHYLPREDNISOLONE ACETATE 40 MG/ML IJ SUSP
40.0000 mg | Freq: Once | INTRAMUSCULAR | Status: DC
  Filled 2015-11-11: qty 1

## 2015-11-11 NOTE — Consult Note (Signed)
Reason for Consult:left knee pain Referring Physician: Cardiology  HPI: Joshua Larsen is an 65 y.o. male with recent acute MI s/p stenting, c/o increasing left knee pain since last Friday. S/p B TKA by dr Lequita Halt in early 2000"s, has done extremely well since that time until last few days. Hx gout, off of allopurinal x 2 years and no acute flairs during that time.  Past Medical History  Diagnosis Date  . Hypertensive heart disease   . CAD (coronary artery disease)     a. 08/2000 s/p CABG x 5 (LIMA->LAD, VG->Diag, VG->dRCA->RPL, VG->OM); b. 07/2014 PCI VG->OM, VG->Diag 100, VG->PDA->RPL 100;  c. 08/2014 Cath: patent stent in VG->OM1->Med Rx; d. 10/2015 NSTEMI/VF arrest/CG Shock.  Marland Kitchen GERD (gastroesophageal reflux disease)   . CKD (chronic kidney disease), stage III   . Sleep apnea     a. On CPAP.  Marland Kitchen Chronic stable angina (HCC)   . Diet-controlled diabetes mellitus [facility])     Past Surgical History  Procedure Laterality Date  . Cardiac catheterization      [place] [facility]  . Back surgery    . Replacement total knee Bilateral     a. 01/2003 LTKA; b. 07/2007 RTKA  . Coronary artery bypass graft      2000  . Wrist surgery Right   . Cardiac catheterization N/A 11/05/2015    Procedure: Coronary/Graft Angiography;  Surgeon: Alwyn Pea, MD;  Location: [facility] INVASIVE CV LAB;  Service: Cardiovascular;  Laterality: N/A;  . Cardiac catheterization N/A 11/05/2015    Procedure: IABP Insertion;  Surgeon: Alwyn Pea, MD;  Location: [facility] INVASIVE CV LAB;  Service: Cardiovascular;  Laterality: N/A;  . Cardiac catheterization N/A 11/09/2015    Procedure: Coronary Stent Intervention;  Surgeon: Peter M Swaziland, MD;  Location: [facility] INVASIVE CV LAB;  Service: Cardiovascular;  Laterality: N/A;    Family History  Problem Relation Age of Onset  . Liver cancer Mother   . CAD Mother   . Arthritis Mother   . Heart attack Father   . CVA Father   . CAD Father   . Dementia Sister     Social History:   reports that he has never smoked. He has never used smokeless tobacco. He reports that he does not drink alcohol or use illicit drugs.  Allergies:  Allergies  Allergen Reactions  . Amoxicillin   . Codeine     GI side effects  . Tagamet [Cimetidine] Other (See Comments)    Causes changes in mental status Causes changes in mental status    Medications: I have reviewed the patient's current medications.  Results for orders placed or performed during the hospital encounter of 11/05/15 (from the past 48 hour(s))  CBC     Status: Abnormal   Collection Time: 11/10/15  5:34 AM  Result Value Ref Range   WBC 14.5 (H) 4.0 - 10.5 K/uL   RBC 4.77 4.22 - 5.81 MIL/uL   Hemoglobin 11.9 (L) 13.0 - 17.0 g/dL   HCT 76.6 (L) 62.0 - 46.5 %   MCV 78.2 78.0 - 100.0 fL   MCH 24.9 (L) 26.0 - 34.0 pg   MCHC 31.9 30.0 - 36.0 g/dL   RDW 86.1 (H) 17.1 - 39.7 %   Platelets 207 150 - 400 K/uL  Basic metabolic panel     Status: Abnormal   Collection Time: 11/10/15  5:34 AM  Result Value Ref Range   Sodium 136 135 - 145 mmol/L   Potassium 4.6 3.5 - 5.1  mmol/L   Chloride 102 101 - 111 mmol/L   CO2 22 22 - 32 mmol/L   Glucose, Bld 175 (H) 65 - 99 mg/dL   BUN 50 (H) 6 - 20 mg/dL   Creatinine, Ser 1.96 (H) 0.61 - 1.24 mg/dL   Calcium 9.1 8.9 - 10.3 mg/dL   GFR calc non Af Amer 34 (L) >60 mL/min   GFR calc Af Amer 40 (L) >60 mL/min    Comment: (NOTE) The eGFR has been calculated using the CKD EPI equation. This calculation has not been validated in all clinical situations. eGFR's persistently <60 mL/min signify possible Chronic Kidney Disease.    Anion gap 12 5 - 15  Glucose, capillary     Status: Abnormal   Collection Time: 11/10/15  8:26 AM  Result Value Ref Range   Glucose-Capillary 166 (H) 65 - 99 mg/dL   Comment 1 Capillary Specimen   Glucose, capillary     Status: Abnormal   Collection Time: 11/10/15 12:44 PM  Result Value Ref Range   Glucose-Capillary 186 (H) 65 - 99 mg/dL   Comment 1  Capillary Specimen   Uric acid     Status: Abnormal   Collection Time: 11/10/15  3:40 PM  Result Value Ref Range   Uric Acid, Serum 10.0 (H) 4.4 - 7.6 mg/dL  Glucose, capillary     Status: Abnormal   Collection Time: 11/10/15  6:04 PM  Result Value Ref Range   Glucose-Capillary 153 (H) 65 - 99 mg/dL   Comment 1 Capillary Specimen   Glucose, capillary     Status: Abnormal   Collection Time: 11/10/15  9:35 PM  Result Value Ref Range   Glucose-Capillary 165 (H) 65 - 99 mg/dL   Comment 1 Capillary Specimen   CBC     Status: Abnormal   Collection Time: 11/11/15  4:05 AM  Result Value Ref Range   WBC 10.0 4.0 - 10.5 K/uL   RBC 4.63 4.22 - 5.81 MIL/uL   Hemoglobin 11.7 (L) 13.0 - 17.0 g/dL   HCT 36.6 (L) 39.0 - 52.0 %   MCV 79.0 78.0 - 100.0 fL   MCH 25.3 (L) 26.0 - 34.0 pg   MCHC 32.0 30.0 - 36.0 g/dL   RDW 15.9 (H) 11.5 - 15.5 %   Platelets 183 150 - 400 K/uL  Basic metabolic panel     Status: Abnormal   Collection Time: 11/11/15  4:05 AM  Result Value Ref Range   Sodium 134 (L) 135 - 145 mmol/L   Potassium 4.2 3.5 - 5.1 mmol/L   Chloride 103 101 - 111 mmol/L   CO2 25 22 - 32 mmol/L   Glucose, Bld 180 (H) 65 - 99 mg/dL   BUN 44 (H) 6 - 20 mg/dL   Creatinine, Ser 1.73 (H) 0.61 - 1.24 mg/dL   Calcium 8.7 (L) 8.9 - 10.3 mg/dL   GFR calc non Af Amer 40 (L) >60 mL/min   GFR calc Af Amer 46 (L) >60 mL/min    Comment: (NOTE) The eGFR has been calculated using the CKD EPI equation. This calculation has not been validated in all clinical situations. eGFR's persistently <60 mL/min signify possible Chronic Kidney Disease.    Anion gap 6 5 - 15  Glucose, capillary     Status: Abnormal   Collection Time: 11/11/15  8:06 AM  Result Value Ref Range   Glucose-Capillary 141 (H) 65 - 99 mg/dL   Comment 1 Capillary Specimen   Glucose, capillary  Status: Abnormal   Collection Time: 11/11/15 12:14 PM  Result Value Ref Range   Glucose-Capillary 125 (H) 65 - 99 mg/dL   Comment 1  Capillary Specimen   Glucose, capillary     Status: Abnormal   Collection Time: 11/11/15  4:46 PM  Result Value Ref Range   Glucose-Capillary 110 (H) 65 - 99 mg/dL   Comment 1 Capillary Specimen     Dg Knee 1-2 Views Left  11/11/2015  CLINICAL DATA:  Pain EXAM: LEFT KNEE - 1-2 VIEW COMPARISON:  None. FINDINGS: Frontal and lateral views were obtained. The patient is status post total knee replacement with the femoral and tibial prosthetic components appearing well-seated. No fracture or dislocation is evident. There is a moderate joint effusion. There is no erosive change or bony destruction. IMPRESSION: Total knee prosthetic components appear well seated. Moderate joint effusion is present. No bony destruction or erosion evident. No fracture or dislocation evident. Electronically Signed   By: Lowella Grip III M.D.   On: 11/11/2015 15:38     Vitals Temp:  [97.8 F (36.6 C)-100.4 F (38 C)] 100.4 F (38 C) (03/22 2118) Pulse Rate:  [73-94] 85 (03/22 2118) Resp:  [8-26] 20 (03/22 2118) BP: (102-150)/(44-80) 132/61 mmHg (03/22 2118) SpO2:  [93 %-100 %] 93 % (03/22 2118) Weight:  [138.8 kg (306 lb)] 138.8 kg (306 lb) (03/22 0417) Body mass index is 45.17 kg/(m^2).  Physical Exam: heavy set WM, A and O. Left LE with well healed anterior midline incision, no erythema or ecchymosis, moderate effusion with extreme tenderness to palpation about knee and with attempts at motion. N/V intact distally, no ascending cellulitis or lympangitis. xrays show effusion, no obvious acute fracture or prosthetic loosening.     Assessment/Plan: Impression: left knee pain and swelling s/p TKA in patient with Hx of gout. 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 Treatment: left knee prepped and field block of 1% Xylocaine. 20cc clear yellow synovial fluid obtained, sent for synovial fluid analysis, cell count crystals. Very suspicious for gout. Discussed with Dr. Philbert Riser on call fellow who will  initiate renal dose colchicine and allopurinol empirically. Will follow up tomorrow  [facility] M Montasia Chisenhall 11/11/2015, 9:27 PM  Contact # [phone]

## 2015-11-11 NOTE — Progress Notes (Signed)
CARDIAC REHAB PHASE I   Pt in bed, states he is unable to walk at this time due to severe knee pain, awaiting ortho consult, will follow PT progress. Began MI/stent education.  Reviewed risk factors, anti-platelet therapy, stent card, activity restrictions, ntg, CHF booklet and zone tool, daily weights, and phase 2 cardiac rehab. Left heart healthy, low sodium and diabetes diet handouts for pt to review. Will discuss exercise/activity progression once pt ambulatory. Pt verbalized understanding, receptive to education. Pt agrees to phase 2 cardiac rehab referral, will send to [facility] per pt request. Pt in bed, call bell within reach. Will follow.  [phone] Joylene Grapes, RN, BSN 11/11/2015 12:38 PM

## 2015-11-11 NOTE — Progress Notes (Signed)
Patient Name:  Joshua Larsen, DOB: April 20, 1951, MRN: [id number] Primary Doctor: Megan Mans, MD Primary Cardiologist:   Date: 11/11/2015   SUBJECTIVE:  Cardiac status is stable. Unfortunately the patient has continued significant pain in his left knee. Etiology is not clear to me. We will need help from orthopedics.   Past Medical History  Diagnosis Date  . Hypertensive heart disease   . CAD (coronary artery disease)     a. 08/2000 s/p CABG x 5 (LIMA->LAD, VG->Diag, VG->dRCA->RPL, VG->OM); b. 07/2014 PCI VG->OM, VG->Diag 100, VG->PDA->RPL 100;  c. 08/2014 Cath: patent stent in VG->OM1->Med Rx; d. 10/2015 NSTEMI/VF arrest/CG Shock.  Marland Kitchen GERD (gastroesophageal reflux disease)   . CKD (chronic kidney disease), stage III   . Sleep apnea     a. On CPAP.  Marland Kitchen Chronic stable angina (HCC)   . Diet-controlled diabetes mellitus (HCC)    Filed Vitals:   11/11/15 0417 11/11/15 0600 11/11/15 0800 11/11/15 0900  BP:  102/59 120/79   Pulse:  73 76 88  Temp:   98 F (36.7 C)   TempSrc:   Oral   Resp:  20 26 8   Height:      Weight: 306 lb (138.8 kg)     SpO2:  100% 100% 100%    Intake/Output Summary (Last 24 hours) at 11/11/15 0913 Last data filed at 11/11/15 0200  Gross per 24 hour  Intake    600 ml  Output   2202 ml  Net  -1602 ml   Filed Weights   11/09/15 0500 11/10/15 0348 11/11/15 0417  Weight: 307 lb 8.7 oz (139.5 kg) 303 lb 9.2 oz (137.7 kg) 306 lb (138.8 kg)     LABS: Basic Metabolic Panel:  Recent Labs  35/46/56 0534 11/11/15 0405  NA 136 134*  K 4.6 4.2  CL 102 103  CO2 22 25  GLUCOSE 175* 180*  BUN 50* 44*  CREATININE 1.96* 1.73*  CALCIUM 9.1 8.7*   Liver Function Tests: No results for input(s): AST, ALT, ALKPHOS, BILITOT, PROT, ALBUMIN in the last 72 hours. No results for input(s): LIPASE, AMYLASE in the last 72 hours. CBC:  Recent Labs  11/10/15 0534 11/11/15 0405  WBC 14.5* 10.0  HGB 11.9* 11.7*  HCT 37.3* 36.6*  MCV 78.2 79.0  PLT  207 183   Cardiac Enzymes: No results for input(s): CKTOTAL, CKMB, CKMBINDEX, TROPONINI in the last 72 hours. BNP: Invalid input(s): POCBNP D-Dimer: No results for input(s): DDIMER in the last 72 hours. Thyroid Function Tests: No results for input(s): TSH, T4TOTAL, T3FREE, THYROIDAB in the last 72 hours.  Invalid input(s): FREET3  RADIOLOGY: Dg Chest 1 View  11/05/2015  CLINICAL DATA:  Check balloon placement.  Cardiac catheterization EXAM: CHEST 1 VIEW COMPARISON:  11/04/2015 FINDINGS: Portable image in cardiac catheterization. There is a metal marker overlying the aortic arch compatible with intra-aortic balloon pump. Cardiac enlargement with CABG. Mild vascular congestion and mild interstitial edema. No pleural effusion. IMPRESSION: Intra-aortic balloon pump marker at the level the aortic arch Cardiac enlargement with mild heart failure and interstitial edema. These results were called by telephone at the time of interpretation on 11/05/2015 at 9:51 am to [facility] in the cardiac cath lab who verbally acknowledged these results. Electronically Signed   By: Marlan Palau M.D.   On: 11/05/2015 09:52   Dg Chest 2 View  11/04/2015  CLINICAL DATA:  Left-sided chest pain beginning last night. History of prior myocardial infarction. Initial encounter. EXAM:  CHEST  2 VIEW COMPARISON:  PA and lateral chest 06/18/2015.  CT chest 09/16/2014. FINDINGS: The patient is status post CABG. Heart size is upper normal. There is no pneumothorax or pleural effusion. No focal airspace disease is identified. Multilevel thoracic spondylosis is seen. Degenerative change is present about the shoulders. IMPRESSION: No acute disease. Electronically Signed   By: Drusilla Kanner M.D.   On: 11/04/2015 08:49   Dg Chest Port 1 View  11/08/2015  CLINICAL DATA:  CHF EXAM: PORTABLE CHEST 1 VIEW COMPARISON:  Chest radiograph from one day prior. FINDINGS: Sternotomy wires appear aligned and intact. Right PICC terminates at the  cavoatrial junction. Stable cardiomediastinal silhouette with mild cardiomegaly. No pneumothorax. No pleural effusion. Moderate pulmonary edema, stable slightly worsened. IMPRESSION: Moderate congestive heart failure, stable to slightly worsened. Electronically Signed   By: Delbert Phenix M.D.   On: 11/08/2015 07:58   Dg Chest Port 1 View  11/07/2015  CLINICAL DATA:  Cough.  Dyspnea. EXAM: PORTABLE CHEST 1 VIEW COMPARISON:  11/05/2015 FINDINGS: There is unchanged cardiomegaly. The lungs are clear. There is no large effusion. Slight fullness of the pulmonary vasculature is unchanged and may be chronic. IMPRESSION: Unchanged cardiomegaly and slight vascular prominence. No interval change from 11/05/2015. Electronically Signed   By: Ellery Plunk M.D.   On: 11/07/2015 04:15   Dg Chest Port 1 View  11/05/2015  CLINICAL DATA:  Balloon pump pulled back. EXAM: PORTABLE CHEST 1 VIEW COMPARISON:  11/05/2015 FINDINGS: Sternotomy wires present. Intra aortic balloon pump is not significantly changed in position and remains over the expected region of the posterior arch at the level of the fourth posterior rib. Lungs are somewhat hypoinflated demonstrate worsening bilateral perihilar opacification right worse than left likely moderate interstitial edema. No evidence of effusion. Cardiomediastinal silhouette and remainder of the exam is unchanged. IMPRESSION: Worsening bilateral perihilar opacification right worse than left likely interstitial edema. Intra aortic balloon pump without significant change as tip appears overlying the expected region of the posterior arch at the level of the fourth posterior rib. Electronically Signed   By: Elberta Fortis M.D.   On: 11/05/2015 17:45   Dg Chest Port 1 View  11/05/2015  CLINICAL DATA:  Status post cardiac catheterization. History of stent placement in left coronary artery. EXAM: PORTABLE CHEST 1 VIEW COMPARISON:  November 05, 2015 FINDINGS: No pneumothorax. The metallic distal  tip of the intra-aortic balloon pump terminates at the level of the aortic arch. The distal tip is approximately is 8 mm from the top of the arch. This is closer to the arch than expected. However, the finding is stable. Stable cardiomegaly. The hila and mediastinum are unchanged. Increasing pulmonary edema. IMPRESSION: 1. The distal tip of the intra-aortic balloon pump is 8 mm from the top of the aortic arch, higher than expected. 2. Increasing pulmonary edema. These results will be called to the ordering clinician or representative by the Radiologist Assistant, and communication documented in the PACS or zVision Dashboard. Electronically Signed   By: Gerome Sam III M.D   On: 11/05/2015 13:04   Dg Chest Port 1v Same Day  11/05/2015  CLINICAL DATA:  Readjusted balloon pump. EXAM: PORTABLE CHEST 1 VIEW COMPARISON:  Chest x-ray from earlier same day. FINDINGS: Marker for the intra-aortic balloon pump is now positioned at the level of the carina. Cardiomegaly is stable. Central pulmonary vascular congestion and mild bilateral interstitial edema appears stable. No new lung findings. No pleural effusion. No pneumothorax seen. IMPRESSION: 1. Marker  for intra-aortic balloon pump now positioned at the level of the carina. 2. Cardiomegaly with central pulmonary vascular congestion and bilateral interstitial edema suggesting CHF/volume overload, stable compared to the exam from earlier today. Electronically Signed   By: Bary Richard M.D.   On: 11/05/2015 21:52   Dg Chest Port 1v Same Day  11/05/2015  CLINICAL DATA:  Intra-aortic balloon pump repositioning. Initial encounter. EXAM: PORTABLE CHEST 1 VIEW COMPARISON:  Chest radiograph performed earlier today at 5:22 p.m. FINDINGS: The patient's intra-aortic balloon pump tip is noted 1.5 cm below the level of the carina. Vascular congestion is again noted, with bilateral central airspace opacities, likely reflecting pulmonary edema, improved from the recent prior  study. No definite pleural effusion or pneumothorax is seen, though the left costophrenic angle is incompletely imaged on this study. The cardiomediastinal silhouette is borderline normal in size. The patient is status post median sternotomy. No acute osseous abnormalities are identified. IMPRESSION: 1. Intra-aortic balloon pump tip noted 1.5 cm below the level of the carina. 2. Vascular congestion, with bilateral central airspace opacities, reflecting pulmonary edema, improved from the recent prior study. Electronically Signed   By: Roanna Raider M.D.   On: 11/05/2015 21:23    PHYSICAL EXAM   patient is oriented to person time and place. Affect is normal. He is obese. Lungs are clear. Respiratory effort is nonlabored. Cardiac exam reveals S1 and S2. There is no significant peripheral edema.   TELEMETRY: I have reviewed telemetry today November 11, 2015. There is normal sinus rhythm. There is scattered PVCs.    ASSESSMENT AND PLAN:   1. CAD: NSTEMI with hypotension and cardiac arrest. Pt presented to [facility] with progressive chest and back pain and ruled in for NSTEMI, troponin ultimately > 65. Cath 3/16 revealed severe multivessel native CAD and severe graft disease with 1/5 patent grafts (only LIMA-LAD patent) and presumed acute/subacute occlusion of the SVG  Diag1. He suffered a VF arrest during the cath with subsequent development of hypotension/chest pain requiring IABP placement and initiation of dopamine. No good redo surgical option (seen by Dr Donata Clay). Cath films reviewed by Drs Kirke Corin, Croitoru, and Cooper over the past few days. Plans were made for complex intervention.  The patient then underwent successful complex PCI to the RCA receiving 3 drug-eluting stents on November 09, 2015. On November 10, 2015 he is now stable. The right radial cath site is stable. Intra-aortic balloon had been taken out and the site is stable. No further cardiac procedures are planned at this time. Physical therapy  has been consulted to help him increase activity and also help assess his left knee.  He is on aspirin, atorvastatin,Brilinta.  2. Acute systolic CHF: EF 16-10% on [facility] echo. Cardiogenic shock on dopamine with IABP.  The balloon pump has been removed. His volume status is stable today. The patient has had ongoing diuresis without diuretics. Plan to continue to follow.  3. AKI on CKD stage III: Creatinine at baseline is 1.9. The lab from yesterday before the cath revealed a creatinine of 1.3. Today creatinine is 1.9. This will be followed carefully to be sure that he does not have significant increase following his catheterization. On November 11, 2015 creatinine is down to 1.7. Plan to continue to follow his renal function with no diuretics.  4. OSA: Continue CPAP @ night.   Left knee pain  Etiology of the left knee discomfort is not clear. His symptoms do not seem to be compatible with his prior gout.  This affected his hands. He says he cannot straighten his left knee. We will proceed with physical therapy consult today. I have decided to use a small dose of tramadol today. Tramadol at low-dose and higher dose has not helped the patient's pain. He says that he has continued throbbing of his left knee at rest. Today, November 11, 2015, we will call orthopedics for formal evaluation.   History of gout  Historically he has been on 100 mg of allopurinol. I've chosen not to restart this today until we are sure his renal function is stable. I've chosen not to use colchicine today because I still think his knee pain is not gout.   Willa Rough 11/11/2015 9:13 AM

## 2015-11-11 NOTE — Progress Notes (Signed)
xrays pending left knee May need aspiration Will see later

## 2015-11-11 NOTE — Progress Notes (Signed)
2 days acute pain left knee Had BIL TKA done by Dr Berton Lan - a fact unbeknownst to me until my eval Effusion and warmth present left knee - wbc 10 k Cannot ambulate Does have history of gout Will call Dr Rennis Chris who is on call for their group tonight May need knee aspiration

## 2015-11-11 NOTE — Progress Notes (Addendum)
Spoke w pt. 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 He does not want hhc at present. States if his gout in knee clears he will be able to get around without any problems. Does not want 3n1 bsc. Has hx of walker but loaned this to friend. Will ck w friend about getting his back but if he can't will request new one. Pt plans to go to son and da in laws for about a week after disch. Just trying to get gout under control. Had been on allopurinol for 2 yrs but this was stopped due to renal. He hopes md can restart allopurinol. Will cont to follow. RE: mc: [email or web address] follow-up appointment  Received: 3 days ago    Carlena Bjornstad, RN           S/W LADY @ [employer] # [phone]   BRILINTA 90 MG BID FOR 30 DAY SUPPLY   COVER- YES  CO-PAY - $ 47.00  ALSO SAME PRICE FOR MAIL ORDER  TIER-3 DRUG  PRIOR APPROVAL- NO  PHARMACY- [facility]

## 2015-11-11 NOTE — Progress Notes (Signed)
  Dr. Myrtis Ser recommended an orthopedic consult in regards to the patient's left knee pain and continued swelling, for this is causing him great discomfort and hindering his activity with cardiac rehab.  I contacted Dr. August Saucer with [facility]. He recommended obtaining X-rays of the patient's knee (Order entered). He will see the patient later today.  Signed, Ellsworth Lennox, PA-C 11/11/2015, 3:43 PM Pager: [phone]

## 2015-11-12 LAB — CBC
HCT: 35.9 % — ABNORMAL LOW (ref 39.0–52.0)
Hemoglobin: 11.2 g/dL — ABNORMAL LOW (ref 13.0–17.0)
MCH: 24.7 pg — ABNORMAL LOW (ref 26.0–34.0)
MCHC: 31.2 g/dL (ref 30.0–36.0)
MCV: 79.2 fL (ref 78.0–100.0)
Platelets: 213 10*3/uL (ref 150–400)
RBC: 4.53 MIL/uL (ref 4.22–5.81)
RDW: 15.8 % — ABNORMAL HIGH (ref 11.5–15.5)
WBC: 10.3 10*3/uL (ref 4.0–10.5)

## 2015-11-12 LAB — GRAM STAIN

## 2015-11-12 LAB — SYNOVIAL CELL COUNT + DIFF, W/ CRYSTALS
Crystals, Fluid: NONE SEEN
Eosinophils-Synovial: 0 % (ref 0–1)
Lymphocytes-Synovial Fld: 9 % (ref 0–20)
Monocyte-Macrophage-Synovial Fluid: 28 % — ABNORMAL LOW (ref 50–90)
NEUTROPHIL, SYNOVIAL: 63 % — AB (ref 0–25)
WBC, Synovial: 151 /mm3 (ref 0–200)

## 2015-11-12 LAB — GLUCOSE, CAPILLARY
GLUCOSE-CAPILLARY: 193 mg/dL — AB (ref 65–99)
GLUCOSE-CAPILLARY: 110 mg/dL — AB (ref 65–99)
GLUCOSE-CAPILLARY: 140 mg/dL — AB (ref 65–99)
Glucose-Capillary: 227 mg/dL — ABNORMAL HIGH (ref 65–99)

## 2015-11-12 LAB — BASIC METABOLIC PANEL
Anion gap: 10 (ref 5–15)
BUN: 37 mg/dL — ABNORMAL HIGH (ref 6–20)
CHLORIDE: 100 mmol/L — AB (ref 101–111)
CO2: 23 mmol/L (ref 22–32)
CREATININE: 1.66 mg/dL — AB (ref 0.61–1.24)
Calcium: 8.6 mg/dL — ABNORMAL LOW (ref 8.9–10.3)
GFR calc non Af Amer: 42 mL/min — ABNORMAL LOW (ref >60)
GFR, EST AFRICAN AMERICAN: 49 mL/min — AB (ref >60)
Glucose, Bld: 128 mg/dL — ABNORMAL HIGH (ref 65–99)
Potassium: 4.3 mmol/L (ref 3.5–5.1)
SODIUM: 133 mmol/L — AB (ref 135–145)

## 2015-11-12 MED ORDER — DOCUSATE SODIUM 100 MG PO CAPS
100.0000 mg | ORAL_CAPSULE | Freq: Two times a day (BID) | ORAL | Status: DC | PRN
  Administered 2015-11-12 – 2015-11-13 (×2): 100 mg via ORAL
  Filled 2015-11-12 (×2): qty 1

## 2015-11-12 MED ORDER — PREDNISONE 10 MG PO TABS
10.0000 mg | ORAL_TABLET | Freq: Every day | ORAL | Status: DC
  Administered 2015-11-12: 10 mg via ORAL
  Filled 2015-11-12: qty 1

## 2015-11-12 MED ORDER — COLCHICINE 0.6 MG PO TABS
0.6000 mg | ORAL_TABLET | Freq: Two times a day (BID) | ORAL | Status: DC
  Administered 2015-11-12 – 2015-11-15 (×7): 0.6 mg via ORAL
  Filled 2015-11-12 (×8): qty 1

## 2015-11-12 NOTE — Care Management Note (Signed)
Case Management Note  Patient Details  Name: Joshua Larsen MRN: [id number] Date of Birth: May 17, 1951  Subjective/Objective:   Pt admitted for v fib arrest. Pt is from home. Pt is having a gout flare up. Placed on Colchicine. Benefits check completed and  SW [employer] @ [employer] # [phone]   COLCHICINE 0.6 MG BID FOE 30 DAY SUPPLY   NOT ON FORMULARY  PRIOR APPROVAL - YES # [phone] OPT-5                  Action/Plan: MD will need to call for prior authorization for Colchicine before price can be given. Pt was given a Brilinta card before transfer to this floor. Per previous CM pt is refusing HH services and DME. Per pt he will be better once gout flare up subsided. No further needs from CM at this time.    Expected Discharge Date:                  Expected Discharge Plan:  Home/Self Care  In-House Referral:     Discharge planning Services  CM Consult, Medication Assistance  Post Acute Care Choice:  NA Choice offered to:  NA  DME Arranged:  N/A DME Agency:  NA  HH Arranged:  NA HH Agency:  NA  Status of Service:  Completed, signed off  Medicare Important Message Given:  Yes Date Medicare IM Given:    Medicare IM give by:    Date Additional Medicare IM Given:    Additional Medicare Important Message give by:     If discussed at Long Length of Stay Meetings, dates discussed:    Additional Comments:  Gala Lewandowsky, RN 11/12/2015, 2:31 PM

## 2015-11-12 NOTE — Progress Notes (Signed)
PT Cancellation Note  Patient Details Name: Joshua Larsen MRN: [id number] DOB: 18-Jan-1951   Cancelled Treatment:    Reason Eval/Treat Not Completed: Pain limiting ability to participate.  Pt adamantly refusing PT at this time due to R knee pain.  Will f/u another time.     Sunny Schlein, [place] [phone] 11/12/2015, 11:55 AM

## 2015-11-12 NOTE — Care Management Important Message (Signed)
Important Message  Patient Details  Name: Joshua Larsen MRN: [id number] Date of Birth: [birth year]   Medicare Important Message Given:  Yes    Kayley Zeiders P Gwenna Fuston 11/12/2015, 1:32 PM

## 2015-11-12 NOTE — Progress Notes (Signed)
Patient Name:  Joshua Larsen, DOB: 04-Oct-1950, MRN: [id number] Primary Doctor: Megan Mans, MD Primary Cardiologist:   Date: 11/12/2015   SUBJECTIVE: patient's cardiac status is stable. Appreciate help from orthopedics yesterday. His knee was tapped. There is clinical suspicion of an acute gouty attack. Follow-up from orthopedics is pending concerning the assessment of the fluid. The patient received one dose of colchicine. He continues to have pain in the knee.   Past Medical History  Diagnosis Date  . Hypertensive heart disease   . CAD (coronary artery disease)     a. 08/2000 s/p CABG x 5 (LIMA->LAD, VG->Diag, VG->dRCA->RPL, VG->OM); b. 07/2014 PCI VG->OM, VG->Diag 100, VG->PDA->RPL 100;  c. 08/2014 Cath: patent stent in VG->OM1->Med Rx; d. 10/2015 NSTEMI/VF arrest/CG Shock.  Marland Kitchen GERD (gastroesophageal reflux disease)   . CKD (chronic kidney disease), stage III   . Sleep apnea     a. On CPAP.  Marland Kitchen Chronic stable angina (HCC)   . Diet-controlled diabetes mellitus (HCC)    Filed Vitals:   11/11/15 1800 11/11/15 1857 11/11/15 2118 11/12/15 0532  BP: 115/58 116/63 132/61 104/67  Pulse: 80 81 85 88  Temp:  99 F (37.2 C) 100.4 F (38 C) 100.1 F (37.8 C)  TempSrc:  Oral Oral Oral  Resp: Height:      Weight:    301 lb 9.4 oz (136.8 kg)  SpO2: 100% 99% 93% 99%    Intake/Output Summary (Last 24 hours) at 11/12/15 0722 Last data filed at 11/12/15 0545  Gross per 24 hour  Intake    720 ml  Output   2100 ml  Net  -1380 ml   Filed Weights   11/10/15 0348 11/11/15 0417 11/12/15 0532  Weight: 303 lb 9.2 oz (137.7 kg) 306 lb (138.8 kg) 301 lb 9.4 oz (136.8 kg)     LABS: Basic Metabolic Panel:  Recent Labs  40/98/11 0405 11/12/15 0500  NA 134* 133*  K 4.2 4.3  CL 103 100*  CO2 25 23  GLUCOSE 180* 128*  BUN 44* 37*  CREATININE 1.73* 1.66*  CALCIUM 8.7* 8.6*   Liver Function Tests: No results for input(s): AST, ALT, ALKPHOS, BILITOT, PROT,  ALBUMIN in the last 72 hours. No results for input(s): LIPASE, AMYLASE in the last 72 hours. CBC:  Recent Labs  11/11/15 0405 11/12/15 0500  WBC 10.0 10.3  HGB 11.7* 11.2*  HCT 36.6* 35.9*  MCV 79.0 79.2  PLT 183 213   Cardiac Enzymes: No results for input(s): CKTOTAL, CKMB, CKMBINDEX, TROPONINI in the last 72 hours. BNP: Invalid input(s): POCBNP D-Dimer: No results for input(s): DDIMER in the last 72 hours. Thyroid Function Tests: No results for input(s): TSH, T4TOTAL, T3FREE, THYROIDAB in the last 72 hours.  Invalid input(s): FREET3  RADIOLOGY: Dg Chest 1 View  11/05/2015  CLINICAL DATA:  Check balloon placement.  Cardiac catheterization EXAM: CHEST 1 VIEW COMPARISON:  11/04/2015 FINDINGS: Portable image in cardiac catheterization. There is a metal marker overlying the aortic arch compatible with intra-aortic balloon pump. Cardiac enlargement with CABG. Mild vascular congestion and mild interstitial edema. No pleural effusion. IMPRESSION: Intra-aortic balloon pump marker at the level the aortic arch Cardiac enlargement with mild heart failure and interstitial edema. These results were called by telephone at the time of interpretation on 11/05/2015 at 9:51 am to [facility] in the cardiac cath lab who verbally acknowledged these results. Electronically Signed   By: Marlan Palau M.D.  On: 11/05/2015 09:52   Dg Chest 2 View  11/04/2015  CLINICAL DATA:  Left-sided chest pain beginning last night. History of prior myocardial infarction. Initial encounter. EXAM: CHEST  2 VIEW COMPARISON:  PA and lateral chest 06/18/2015.  CT chest 09/16/2014. FINDINGS: The patient is status post CABG. Heart size is upper normal. There is no pneumothorax or pleural effusion. No focal airspace disease is identified. Multilevel thoracic spondylosis is seen. Degenerative change is present about the shoulders. IMPRESSION: No acute disease. Electronically Signed   By: Drusilla Kanner M.D.   On: 11/04/2015 08:49    Dg Knee 1-2 Views Left  11/11/2015  CLINICAL DATA:  Pain EXAM: LEFT KNEE - 1-2 VIEW COMPARISON:  None. FINDINGS: Frontal and lateral views were obtained. The patient is status post total knee replacement with the femoral and tibial prosthetic components appearing well-seated. No fracture or dislocation is evident. There is a moderate joint effusion. There is no erosive change or bony destruction. IMPRESSION: Total knee prosthetic components appear well seated. Moderate joint effusion is present. No bony destruction or erosion evident. No fracture or dislocation evident. Electronically Signed   By: Bretta Bang III M.D.   On: 11/11/2015 15:38   Dg Chest Port 1 View  11/08/2015  CLINICAL DATA:  CHF EXAM: PORTABLE CHEST 1 VIEW COMPARISON:  Chest radiograph from one day prior. FINDINGS: Sternotomy wires appear aligned and intact. Right PICC terminates at the cavoatrial junction. Stable cardiomediastinal silhouette with mild cardiomegaly. No pneumothorax. No pleural effusion. Moderate pulmonary edema, stable slightly worsened. IMPRESSION: Moderate congestive heart failure, stable to slightly worsened. Electronically Signed   By: Delbert Phenix M.D.   On: 11/08/2015 07:58   Dg Chest Port 1 View  11/07/2015  CLINICAL DATA:  Cough.  Dyspnea. EXAM: PORTABLE CHEST 1 VIEW COMPARISON:  11/05/2015 FINDINGS: There is unchanged cardiomegaly. The lungs are clear. There is no large effusion. Slight fullness of the pulmonary vasculature is unchanged and may be chronic. IMPRESSION: Unchanged cardiomegaly and slight vascular prominence. No interval change from 11/05/2015. Electronically Signed   By: Ellery Plunk M.D.   On: 11/07/2015 04:15   Dg Chest Port 1 View  11/05/2015  CLINICAL DATA:  Balloon pump pulled back. EXAM: PORTABLE CHEST 1 VIEW COMPARISON:  11/05/2015 FINDINGS: Sternotomy wires present. Intra aortic balloon pump is not significantly changed in position and remains over the expected region of the  posterior arch at the level of the fourth posterior rib. Lungs are somewhat hypoinflated demonstrate worsening bilateral perihilar opacification right worse than left likely moderate interstitial edema. No evidence of effusion. Cardiomediastinal silhouette and remainder of the exam is unchanged. IMPRESSION: Worsening bilateral perihilar opacification right worse than left likely interstitial edema. Intra aortic balloon pump without significant change as tip appears overlying the expected region of the posterior arch at the level of the fourth posterior rib. Electronically Signed   By: Elberta Fortis M.D.   On: 11/05/2015 17:45   Dg Chest Port 1 View  11/05/2015  CLINICAL DATA:  Status post cardiac catheterization. History of stent placement in left coronary artery. EXAM: PORTABLE CHEST 1 VIEW COMPARISON:  November 05, 2015 FINDINGS: No pneumothorax. The metallic distal tip of the intra-aortic balloon pump terminates at the level of the aortic arch. The distal tip is approximately is 8 mm from the top of the arch. This is closer to the arch than expected. However, the finding is stable. Stable cardiomegaly. The hila and mediastinum are unchanged. Increasing pulmonary edema. IMPRESSION: 1. The distal  tip of the intra-aortic balloon pump is 8 mm from the top of the aortic arch, higher than expected. 2. Increasing pulmonary edema. These results will be called to the ordering clinician or representative by the Radiologist Assistant, and communication documented in the PACS or zVision Dashboard. Electronically Signed   By: Gerome Sam III M.D   On: 11/05/2015 13:04   Dg Chest Port 1v Same Day  11/05/2015  CLINICAL DATA:  Readjusted balloon pump. EXAM: PORTABLE CHEST 1 VIEW COMPARISON:  Chest x-ray from earlier same day. FINDINGS: Marker for the intra-aortic balloon pump is now positioned at the level of the carina. Cardiomegaly is stable. Central pulmonary vascular congestion and mild bilateral interstitial edema  appears stable. No new lung findings. No pleural effusion. No pneumothorax seen. IMPRESSION: 1. Marker for intra-aortic balloon pump now positioned at the level of the carina. 2. Cardiomegaly with central pulmonary vascular congestion and bilateral interstitial edema suggesting CHF/volume overload, stable compared to the exam from earlier today. Electronically Signed   By: Bary Richard M.D.   On: 11/05/2015 21:52   Dg Chest Port 1v Same Day  11/05/2015  CLINICAL DATA:  Intra-aortic balloon pump repositioning. Initial encounter. EXAM: PORTABLE CHEST 1 VIEW COMPARISON:  Chest radiograph performed earlier today at 5:22 p.m. FINDINGS: The patient's intra-aortic balloon pump tip is noted 1.5 cm below the level of the carina. Vascular congestion is again noted, with bilateral central airspace opacities, likely reflecting pulmonary edema, improved from the recent prior study. No definite pleural effusion or pneumothorax is seen, though the left costophrenic angle is incompletely imaged on this study. The cardiomediastinal silhouette is borderline normal in size. The patient is status post median sternotomy. No acute osseous abnormalities are identified. IMPRESSION: 1. Intra-aortic balloon pump tip noted 1.5 cm below the level of the carina. 2. Vascular congestion, with bilateral central airspace opacities, reflecting pulmonary edema, improved from the recent prior study. Electronically Signed   By: Roanna Raider M.D.   On: 11/05/2015 21:23    PHYSICAL EXAM  patient is oriented to person time and place. Affect is normal. Lungs reveal scattered rhonchi. Cardiac exam reveals S1 and S2. There is no edema in the right leg. His left knee is status post tap from yesterday.   TELEMETRY: I have reviewed telemetry today November 12, 2015. There is normal sinus rhythm.   ASSESSMENT AND PLAN:1. CAD: NSTEMI with hypotension and cardiac arrest. Pt presented to [facility] with progressive chest and back pain and ruled in for  NSTEMI, troponin ultimately > 65. Cath 3/16 revealed severe multivessel native CAD and severe graft disease with 1/5 patent grafts (only LIMA-LAD patent) and presumed acute/subacute occlusion of the SVG  Diag1. He suffered a VF arrest during the cath with subsequent development of hypotension/chest pain requiring IABP placement and initiation of dopamine. No good redo surgical option (seen by Dr Donata Clay). Cath films reviewed by Drs Kirke Corin, Croitoru, and Cooper over the past few days. Plans were made for complex intervention.  The patient then underwent successful complex PCI to the RCA receiving 3 drug-eluting stents on November 09, 2015. On November 10, 2015 he is now stable. The right radial cath site is stable. Intra-aortic balloon had been taken out and the site is stable. No further cardiac procedures are planned at this time. Physical therapy has been consulted to help him increase activity and also help assess his left knee.  He is on aspirin, atorvastatin,Brilinta.  2. Acute systolic CHF: EF 62-83% on [facility] echo. Cardiogenic  shock on dopamine with IABP.  The balloon pump has been removed. His volume status is stable today. He has continued diuresis without diuretics. No change in therapy for today, November 12, 2015.  3. AKI on CKD stage III: Creatinine at baseline is 1.9. The lab from yesterday before the cath revealed a creatinine of 1.3. Today creatinine is 1.9. This will be followed carefully to be sure that he does not have significant increase following his catheterization. On November 11, 2015 creatinine is down to 1.7. On November 12, 2015 his creatinine is down to 1.6. Continue to follow him off diuretic today.  4. OSA: Continue CPAP @ night.   Left knee pain  Etiology of the left knee discomfort is not clear. His symptoms do not seem to be compatible with his prior gout. This affected his hands. He says he cannot straighten his left knee. We will proceed with physical therapy  consult today. I have decided to use a small dose of tramadol today. Tramadol at low-dose and higher dose has not helped the patient's pain. He says that he has continued throbbing of his left knee at rest. As of November 12, 2015 in the early a.m., the patient has been seen by orthopedics yesterday. A knee tap was done. The final assessment of this fluid is pending. In the meantime he was empirically started on low-dose colchicine. I will increase his colchicine dose. Orthopedics will return today for further assessment of the fluid from his knee and plans. He has continued pain. He will need further therapy, possibly including steroids. I'm very hesitant to start any steroids until we are sure about the diagnosis from his knee. We will consult internal medicine to help with the treatment of his gout if this turns out to be the diagnosis.   History of gout  Historically he has been on 100 mg of allopurinol. I've chosen not to restart this today until we are sure his renal function is stable. He is now on colchicine. I have not started allopurinol as it is generally not recommended with acute gout. I will wait for further advice from orthopedics and internal medicine.   Willa Rough 11/12/2015 7:22 AM

## 2015-11-12 NOTE — H&P (Signed)
Joshua Larsen  MRN: [id number] DOB/Age: April 10, 1951 65 y.o. Physician: Lynnea Maizes, M.D. 3 Days Post-Op Procedure(s) (LRB): Coronary Stent Intervention (N/A)  Subjective: Reports ongoing severe Right knee pain. Vital Signs Temp:  [98 F (36.7 C)-100.4 F (38 C)] 100.1 F (37.8 C) (03/23 0532) Pulse Rate:  [80-88] 88 (03/23 0532) Resp:  [16-23] 20 (03/22 2118) BP: (104-132)/(58-80) 104/67 mmHg (03/23 0532) SpO2:  [93 %-100 %] 99 % (03/23 0532) Weight:  [136.8 kg (301 lb 9.4 oz)] 136.8 kg (301 lb 9.4 oz) (03/23 0532)  Lab Results  Recent Labs  11/11/15 0405 11/12/15 0500  WBC 10.0 10.3  HGB 11.7* 11.2*  HCT 36.6* 35.9*  PLT 183 213   BMET  Recent Labs  11/11/15 0405 11/12/15 0500  NA 134* 133*  K 4.2 4.3  CL 103 100*  CO2 25 23  GLUCOSE 180* 128*  BUN 44* 37*  CREATININE 1.73* 1.66*  CALCIUM 8.7* 8.6*   INR  Date Value Ref Range Status  11/04/2015 1.12  Final  09/17/2014 1.2  Final    Comment:    INR reference interval applies to patients on anticoagulant therapy. A single INR therapeutic range for coumarins is not optimal for all indications; however, the suggested range for most indications is 2.0 - 3.0. Exceptions to the INR Reference Range may include: Prosthetic heart valves, acute myocardial infarction, prevention of myocardial infarction, and combinations of aspirin and anticoagulant. The need for a higher or lower target INR must be assessed individually. Reference: The Pharmacology and Management of the Vitamin K  antagonists: the seventh ACCP Conference on Antithrombotic and Thrombolytic Therapy. Chest.2004 Sept:126 (3suppl): [id number]. A HCT value >55% may artifactually increase the PT.  In one study,  the increase was an average of 25%. Reference:  "Effect on Routine and Special Coagulation Testing Values of Citrate Anticoagulant Adjustment in Patients with High HCT Values." American Journal of Clinical Pathology 2006;126:400-405.       Exam  Left knee again shows no erythema or induration. diffusely tender, poor quad tone, severe pain with attempts at ROM  Plan Joint fluid did not show any crystals although does not rule out Gout. Cell count goes against infectious etiology. Recommend trial of oral corticosteroids, dose pack or stable dose. Patient reports concerns about steroids stating that the last time he took a dosepack he felt his heart rate elevated and may have contributed to cardiac event. Will defer to cardiology/medicine for dosing Darreld Hoffer M Alanis Clift 11/12/2015, 12:43 PM    Contact # [phone]

## 2015-11-12 NOTE — Progress Notes (Signed)
  Dr. Rennis Chris recommended trying oral corticosteroids for the patient's left knee pain. The patient reports in the past he took a Prednisone pack and felt tachycardiac that evening which brought him to the hospital and further workup showed CAD and he ruled in for NSTEMI.   He has taken smaller doses since with no symptoms. Will initiate PO Prednisone 10mg  daily and follow his BP, HR, and glucose closely. Would like to initiate a higher dose, but with his previous symptoms, we will proceed conservatively.    Appreciate Orthopedics recommendation.  Signed, Ellsworth Lennox, PA-C 11/12/2015, 1:47 PM Pager: [phone]

## 2015-11-13 LAB — GLUCOSE, CAPILLARY
GLUCOSE-CAPILLARY: 134 mg/dL — AB (ref 65–99)
Glucose-Capillary: 129 mg/dL — ABNORMAL HIGH (ref 65–99)
Glucose-Capillary: 186 mg/dL — ABNORMAL HIGH (ref 65–99)
Glucose-Capillary: 154 mg/dL — ABNORMAL HIGH (ref 65–99)

## 2015-11-13 LAB — CBC
HEMATOCRIT: 36.2 % — AB (ref 39.0–52.0)
Hemoglobin: 11.4 g/dL — ABNORMAL LOW (ref 13.0–17.0)
MCH: 24.9 pg — AB (ref 26.0–34.0)
MCHC: 31.5 g/dL (ref 30.0–36.0)
MCV: 79 fL (ref 78.0–100.0)
Platelets: 218 10*3/uL (ref 150–400)
RBC: 4.58 MIL/uL (ref 4.22–5.81)
RDW: 15.6 % — AB (ref 11.5–15.5)
WBC: 13.2 10*3/uL — AB (ref 4.0–10.5)

## 2015-11-13 MED ORDER — PREDNISONE 10 MG (21) PO TBPK
10.0000 mg | ORAL_TABLET | ORAL | Status: AC
  Administered 2015-11-13: 10 mg via ORAL

## 2015-11-13 MED ORDER — ALTEPLASE 2 MG IJ SOLR
2.0000 mg | Freq: Once | INTRAMUSCULAR | Status: AC
  Administered 2015-11-13: 2 mg
  Filled 2015-11-13 (×2): qty 2

## 2015-11-13 MED ORDER — CARVEDILOL 3.125 MG PO TABS
3.1250 mg | ORAL_TABLET | Freq: Two times a day (BID) | ORAL | Status: DC
  Administered 2015-11-13 – 2015-11-15 (×5): 3.125 mg via ORAL
  Filled 2015-11-13 (×5): qty 1

## 2015-11-13 MED ORDER — ALTEPLASE 2 MG IJ SOLR
2.0000 mg | Freq: Once | INTRAMUSCULAR | Status: AC
  Administered 2015-11-13: 2 mg
  Filled 2015-11-13: qty 2

## 2015-11-13 MED ORDER — PREDNISONE 10 MG (21) PO TBPK
10.0000 mg | ORAL_TABLET | Freq: Three times a day (TID) | ORAL | Status: AC
  Administered 2015-11-14 (×3): 10 mg via ORAL
  Filled 2015-11-13: qty 21

## 2015-11-13 MED ORDER — PREDNISONE 10 MG (21) PO TBPK: 20.0000 mg | ORAL_TABLET | Freq: Every evening | ORAL | Status: AC

## 2015-11-13 MED ORDER — PREDNISONE 10 MG (21) PO TBPK
20.0000 mg | ORAL_TABLET | Freq: Every morning | ORAL | Status: AC
  Administered 2015-11-13: 20 mg via ORAL
  Filled 2015-11-13: qty 21

## 2015-11-13 MED ORDER — PREDNISONE 10 MG (21) PO TBPK
10.0000 mg | ORAL_TABLET | Freq: Four times a day (QID) | ORAL | Status: DC
  Administered 2015-11-15 (×2): 10 mg via ORAL

## 2015-11-13 MED ORDER — PREDNISONE 10 MG (21) PO TBPK
20.0000 mg | ORAL_TABLET | Freq: Every evening | ORAL | Status: AC
  Administered 2015-11-13: 20 mg via ORAL

## 2015-11-13 NOTE — Progress Notes (Signed)
Awaiting progress with PT for ambulation. Pt still c/o severe pain. Discussed low sodium diet, good reception. Encouraged pt to read through his materials and watch HR video.  [phone] Ethelda Chick CES, ACSM 12:06 PM 11/13/2015

## 2015-11-13 NOTE — Progress Notes (Signed)
Physical Therapy Treatment Patient Details Name: Joshua Larsen MRN: [id number] DOB: 11-05-1950 Today's Date: 11/13/2015    History of Present Illness : Patient underwent successful complex PCI to the right coronary artery yesterday. He received 3 drug-eluting stents. Procedure was done through the right radial and he is stable. He is not having any chest pain. His only complaint at this time is inability to straighten his left leg. He does have some discomfort in the knee. This symptom does not seem to be consistent with his prior history of gout that affected his hands.    PT Comments    Patient making progress with mobility and gait today.  Able to ambulate short distances, but pain remains limiting factor.  Follow Up Recommendations  Home health PT;Supervision - Intermittent     Equipment Recommendations  Rolling walker with 5" wheels;3in1 (PT) (Bari 3-in-1 BSC)    Recommendations for Other Services       Precautions / Restrictions Precautions Precautions: Fall Restrictions Weight Bearing Restrictions: No    Mobility  Bed Mobility Overal bed mobility: Needs Assistance Bed Mobility: Supine to Sit;Sit to Supine     Supine to sit: Supervision Sit to supine: Min assist   General bed mobility comments: Assist to bring LLE on to bed to return to supine  Transfers Overall transfer level: Needs assistance Equipment used: Rolling walker (2 wheeled) Transfers: Sit to/from Stand Sit to Stand: Min assist;Mod assist;+2 safety/equipment         General transfer comment: Min assist to stand from bed, mod assist from toilet.  Verbal cues for technique and hand placement.  Patient preferred to pull up using RW.  Steadied RW for transfer.  Assist to power up to stance.    Ambulation/Gait Ambulation/Gait assistance: Min assist;+2 safety/equipment Ambulation Distance (Feet): 30 Feet (15' x2 to/from bathroom) Assistive device: Rolling walker (2 wheeled) Gait  Pattern/deviations: Step-to pattern;Decreased stance time - left;Decreased step length - right;Decreased step length - left;Decreased weight shift to left;Antalgic Gait velocity: decreased Gait velocity interpretation: Below normal speed for age/gender General Gait Details: Verbal cues for safe use of RW.  Had +2 assist for lines and safety.  Patient into bathroom - declined use of 3-in-1 over toilet stating it is too narrow.  Patient had BM - RN notified.   Stairs            Wheelchair Mobility    Modified Rankin (Stroke Patients Only)       Balance           Standing balance support: Bilateral upper extremity supported Standing balance-Leahy Scale: Poor                      Cognition Arousal/Alertness: Awake/alert Behavior During Therapy: Anxious Overall Cognitive Status: Within Functional Limits for tasks assessed                      Exercises      General Comments        Pertinent Vitals/Pain Pain Assessment: 0-10 Pain Score: 9  Pain Location: Lt knee Pain Descriptors / Indicators: Aching;Sore;Sharp Pain Intervention(s): Limited activity within patient's tolerance;Monitored during session;Repositioned    Home Living                      Prior Function            PT Goals (current goals can now be found in the care plan section) Acute Rehab PT  Goals Patient Stated Goal: to get rid of left LE pain Progress towards PT goals: Progressing toward goals    Frequency  Min 3X/week    PT Plan Current plan remains appropriate    Co-evaluation             End of Session Equipment Utilized During Treatment: Gait belt Activity Tolerance: Patient limited by fatigue;Patient limited by pain Patient left: in bed;with call bell/phone within reach;with bed alarm set     Time: 1440-1512 PT Time Calculation (min) (ACUTE ONLY): 32 min  Charges:  $Gait Training: 23-37 mins                    G Codes:      [name] Dec 09, 2015, 4:01 PM [name], [facility] Acute Rehab Services Pager [phone]

## 2015-11-13 NOTE — Progress Notes (Signed)
Agree with full taper of oral steroids and physical therapy. Would consider resuming allopurinol when acute flare has resolved. Continue to follow clinical progress. Call through the weekend if needs arise.  Ralene Bathe, PA-C for Dr. Rennis Chris

## 2015-11-13 NOTE — Progress Notes (Signed)
Patient Name:  Joshua Larsen, DOB: 1951/01/23, MRN: [id number] Primary Doctor: Megan Mans, MD Primary Cardiologist:   Date: 11/13/2015   SUBJECTIVE:  Appreciate help from Dr.Supple concerning the patient's left knee. Recommendation is a tapering course of steroids. The patient was hesitant because of question of tachycardia related to steroids in the past. He tolerated 10 mg yesterday. He has had some improvement in his knee.   Past Medical History  Diagnosis Date  . Hypertensive heart disease   . CAD (coronary artery disease)     a. 08/2000 s/p CABG x 5 (LIMA->LAD, VG->Diag, VG->dRCA->RPL, VG->OM); b. 07/2014 PCI VG->OM, VG->Diag 100, VG->PDA->RPL 100;  c. 08/2014 Cath: patent stent in VG->OM1->Med Rx; d. 10/2015 NSTEMI/VF arrest/CG Shock.  Marland Kitchen GERD (gastroesophageal reflux disease)   . CKD (chronic kidney disease), stage III   . Sleep apnea     a. On CPAP.  Marland Kitchen Chronic stable angina (HCC)   . Diet-controlled diabetes mellitus (HCC)    Filed Vitals:   11/12/15 1404 11/12/15 2023 11/12/15 2100 11/13/15 0421  BP: 104/60 131/59  121/73  Pulse: 77 86  71  Temp: 99.1 F (37.3 C) 99.5 F (37.5 C)  97.9 F (36.6 C)  TempSrc: Oral Oral  Oral  Resp: 18 17  18   Height:      Weight:    292 lb 12.3 oz (132.8 kg)  SpO2: 99% 89% 97% 100%    Intake/Output Summary (Last 24 hours) at 11/13/15 0801 Last data filed at 11/13/15 0352  Gross per 24 hour  Intake    480 ml  Output   1900 ml  Net  -1420 ml   Filed Weights   11/11/15 0417 11/12/15 0532 11/13/15 0421  Weight: 306 lb (138.8 kg) 301 lb 9.4 oz (136.8 kg) 292 lb 12.3 oz (132.8 kg)     LABS: Basic Metabolic Panel:  Recent Labs  72/90/21 0405 11/12/15 0500  NA 134* 133*  K 4.2 4.3  CL 103 100*  CO2 25 23  GLUCOSE 180* 128*  BUN 44* 37*  CREATININE 1.73* 1.66*  CALCIUM 8.7* 8.6*   Liver Function Tests: No results for input(s): AST, ALT, ALKPHOS, BILITOT, PROT, ALBUMIN in the last 72 hours. No results for  input(s): LIPASE, AMYLASE in the last 72 hours. CBC:  Recent Labs  11/12/15 0500 11/13/15 0448  WBC 10.3 13.2*  HGB 11.2* 11.4*  HCT 35.9* 36.2*  MCV 79.2 79.0  PLT 213 218   Cardiac Enzymes: No results for input(s): CKTOTAL, CKMB, CKMBINDEX, TROPONINI in the last 72 hours. BNP: Invalid input(s): POCBNP D-Dimer: No results for input(s): DDIMER in the last 72 hours. Thyroid Function Tests: No results for input(s): TSH, T4TOTAL, T3FREE, THYROIDAB in the last 72 hours.  Invalid input(s): FREET3  RADIOLOGY: Dg Chest 1 View  11/05/2015  CLINICAL DATA:  Check balloon placement.  Cardiac catheterization EXAM: CHEST 1 VIEW COMPARISON:  11/04/2015 FINDINGS: Portable image in cardiac catheterization. There is a metal marker overlying the aortic arch compatible with intra-aortic balloon pump. Cardiac enlargement with CABG. Mild vascular congestion and mild interstitial edema. No pleural effusion. IMPRESSION: Intra-aortic balloon pump marker at the level the aortic arch Cardiac enlargement with mild heart failure and interstitial edema. These results were called by telephone at the time of interpretation on 11/05/2015 at 9:51 am to [facility] in the cardiac cath lab who verbally acknowledged these results. Electronically Signed   By: Marlan Palau M.D.   On: 11/05/2015 09:52  Dg Chest 2 View  11/04/2015  CLINICAL DATA:  Left-sided chest pain beginning last night. History of prior myocardial infarction. Initial encounter. EXAM: CHEST  2 VIEW COMPARISON:  PA and lateral chest 06/18/2015.  CT chest 09/16/2014. FINDINGS: The patient is status post CABG. Heart size is upper normal. There is no pneumothorax or pleural effusion. No focal airspace disease is identified. Multilevel thoracic spondylosis is seen. Degenerative change is present about the shoulders. IMPRESSION: No acute disease. Electronically Signed   By: Drusilla Kanner M.D.   On: 11/04/2015 08:49   Dg Knee 1-2 Views Left  11/11/2015   CLINICAL DATA:  Pain EXAM: LEFT KNEE - 1-2 VIEW COMPARISON:  None. FINDINGS: Frontal and lateral views were obtained. The patient is status post total knee replacement with the femoral and tibial prosthetic components appearing well-seated. No fracture or dislocation is evident. There is a moderate joint effusion. There is no erosive change or bony destruction. IMPRESSION: Total knee prosthetic components appear well seated. Moderate joint effusion is present. No bony destruction or erosion evident. No fracture or dislocation evident. Electronically Signed   By: Bretta Bang III M.D.   On: 11/11/2015 15:38   Dg Chest Port 1 View  11/08/2015  CLINICAL DATA:  CHF EXAM: PORTABLE CHEST 1 VIEW COMPARISON:  Chest radiograph from one day prior. FINDINGS: Sternotomy wires appear aligned and intact. Right PICC terminates at the cavoatrial junction. Stable cardiomediastinal silhouette with mild cardiomegaly. No pneumothorax. No pleural effusion. Moderate pulmonary edema, stable slightly worsened. IMPRESSION: Moderate congestive heart failure, stable to slightly worsened. Electronically Signed   By: Delbert Phenix M.D.   On: 11/08/2015 07:58   Dg Chest Port 1 View  11/07/2015  CLINICAL DATA:  Cough.  Dyspnea. EXAM: PORTABLE CHEST 1 VIEW COMPARISON:  11/05/2015 FINDINGS: There is unchanged cardiomegaly. The lungs are clear. There is no large effusion. Slight fullness of the pulmonary vasculature is unchanged and may be chronic. IMPRESSION: Unchanged cardiomegaly and slight vascular prominence. No interval change from 11/05/2015. Electronically Signed   By: Ellery Plunk M.D.   On: 11/07/2015 04:15   Dg Chest Port 1 View  11/05/2015  CLINICAL DATA:  Balloon pump pulled back. EXAM: PORTABLE CHEST 1 VIEW COMPARISON:  11/05/2015 FINDINGS: Sternotomy wires present. Intra aortic balloon pump is not significantly changed in position and remains over the expected region of the posterior arch at the level of the fourth  posterior rib. Lungs are somewhat hypoinflated demonstrate worsening bilateral perihilar opacification right worse than left likely moderate interstitial edema. No evidence of effusion. Cardiomediastinal silhouette and remainder of the exam is unchanged. IMPRESSION: Worsening bilateral perihilar opacification right worse than left likely interstitial edema. Intra aortic balloon pump without significant change as tip appears overlying the expected region of the posterior arch at the level of the fourth posterior rib. Electronically Signed   By: Elberta Fortis M.D.   On: 11/05/2015 17:45   Dg Chest Port 1 View  11/05/2015  CLINICAL DATA:  Status post cardiac catheterization. History of stent placement in left coronary artery. EXAM: PORTABLE CHEST 1 VIEW COMPARISON:  November 05, 2015 FINDINGS: No pneumothorax. The metallic distal tip of the intra-aortic balloon pump terminates at the level of the aortic arch. The distal tip is approximately is 8 mm from the top of the arch. This is closer to the arch than expected. However, the finding is stable. Stable cardiomegaly. The hila and mediastinum are unchanged. Increasing pulmonary edema. IMPRESSION: 1. The distal tip of the intra-aortic balloon  pump is 8 mm from the top of the aortic arch, higher than expected. 2. Increasing pulmonary edema. These results will be called to the ordering clinician or representative by the Radiologist Assistant, and communication documented in the PACS or zVision Dashboard. Electronically Signed   By: Gerome Sam III M.D   On: 11/05/2015 13:04   Dg Chest Port 1v Same Day  11/05/2015  CLINICAL DATA:  Readjusted balloon pump. EXAM: PORTABLE CHEST 1 VIEW COMPARISON:  Chest x-ray from earlier same day. FINDINGS: Marker for the intra-aortic balloon pump is now positioned at the level of the carina. Cardiomegaly is stable. Central pulmonary vascular congestion and mild bilateral interstitial edema appears stable. No new lung findings. No  pleural effusion. No pneumothorax seen. IMPRESSION: 1. Marker for intra-aortic balloon pump now positioned at the level of the carina. 2. Cardiomegaly with central pulmonary vascular congestion and bilateral interstitial edema suggesting CHF/volume overload, stable compared to the exam from earlier today. Electronically Signed   By: Bary Richard M.D.   On: 11/05/2015 21:52   Dg Chest Port 1v Same Day  11/05/2015  CLINICAL DATA:  Intra-aortic balloon pump repositioning. Initial encounter. EXAM: PORTABLE CHEST 1 VIEW COMPARISON:  Chest radiograph performed earlier today at 5:22 p.m. FINDINGS: The patient's intra-aortic balloon pump tip is noted 1.5 cm below the level of the carina. Vascular congestion is again noted, with bilateral central airspace opacities, likely reflecting pulmonary edema, improved from the recent prior study. No definite pleural effusion or pneumothorax is seen, though the left costophrenic angle is incompletely imaged on this study. The cardiomediastinal silhouette is borderline normal in size. The patient is status post median sternotomy. No acute osseous abnormalities are identified. IMPRESSION: 1. Intra-aortic balloon pump tip noted 1.5 cm below the level of the carina. 2. Vascular congestion, with bilateral central airspace opacities, reflecting pulmonary edema, improved from the recent prior study. Electronically Signed   By: Roanna Raider M.D.   On: 11/05/2015 21:23    PHYSICAL EXAM  patient is comfortable. He is oriented to person time and place. Affect is normal. Lungs reveal a few scattered rhonchi. Cardiac exam reveals S1 and S2. He has no significant peripheral edema.   TELEMETRY: I have reviewed telemetry today November 13, 2015. There is normal sinus rhythm.   ASSESSMENT AND PLAN:1. CAD: NSTEMI with hypotension and cardiac arrest. Pt presented to [facility] with progressive chest and back pain and ruled in for NSTEMI, troponin ultimately > 65. Cath 3/16 revealed severe  multivessel native CAD and severe graft disease with 1/5 patent grafts (only LIMA-LAD patent) and presumed acute/subacute occlusion of the SVG  Diag1. He suffered a VF arrest during the cath with subsequent development of hypotension/chest pain requiring IABP placement and initiation of dopamine. No good redo surgical option (seen by Dr Donata Clay). Cath films reviewed by Drs Kirke Corin, Croitoru, and Cooper over the past few days. Plans were made for complex intervention.  The patient then underwent successful complex PCI to the RCA receiving 3 drug-eluting stents on November 09, 2015. On November 10, 2015 he is now stable. The right radial cath site is stable. Intra-aortic balloon had been taken out and the site is stable. No further cardiac procedures are planned at this time. Physical therapy has been consulted to help him increase activity and also help assess his left knee.  He is on aspirin, atorvastatin,Brilinta.  2. Acute systolic CHF: EF 16-10% on [facility] echo. Cardiogenic shock on dopamine with IABP.  The balloon pump has been  removed. His volume status is stable today. He has continued diuresis without diuretics. No change in therapy for today, November 12, 2015. Because the patient required an intra-aortic balloon pump with hypotension, medications for left ventricular dysfunction have not yet been added. He also has renal insufficiency. The patient's Falla status continues to stabilize. He continues to diuresis without any diuretics. Renal function is remaining stable for him. He is not yet on a beta blocker or an ACE inhibitor. I will start his beta blocker today. We will then plan continued titration of his medications for left ventricular dysfunction.  3. AKI on CKD stage III: Creatinine at baseline is 1.9. The lab from yesterday before the cath revealed a creatinine of 1.3. Today creatinine is 1.9. This will be followed carefully to be sure that he does not have significant increase following  his catheterization. On November 11, 2015 creatinine is down to 1.7. On November 12, 2015 his creatinine is down to 1.6. Continue to follow him off diuretic today. Chemistry labs will be checked tomorrow, November 14, 2015  4. OSA: Continue CPAP @ night.   Left knee pain  Etiology of the left knee discomfort is not clear. His symptoms do not seem to be compatible with his prior gout. This affected his hands. He says he cannot straighten his left knee. We will proceed with physical therapy consult today. I have decided to use a small dose of tramadol today. Tramadol at low-dose and higher dose has not helped the patient's pain. He says that he has continued throbbing of his left knee at rest. As of November 12, 2015 in the early a.m., the patient has been seen by orthopedics yesterday. A knee tap was done. The final assessment of this fluid is pending. In the meantime he was empirically started on low-dose colchicine. I will increase his colchicine dose. Orthopedics will return today for further assessment of the fluid from his knee and plans. He has continued pain. He will need further therapy, possibly including steroids. I'm very hesitant to start any steroids until we are sure about the diagnosis from his knee. On March 23, Dr. Rennis Chris of orthopedics noted there were no crystals in the fluid from his knee. However there was no evidence infection. It was felt that his presentation was still consistent with acute gout. Steroids were recommended. The patient has concerns with the use of steroids because he associates this with prior rapid heart rate at the time of his cardiac event. I feel that it is safe to use a full tapering dose of steroids. He received 10 mg orally yesterday and tolerated this well. I've written orders today for full dose steroid taper for his knee.   History of gout  Historically he has been on 100 mg of allopurinol. I've chosen not to restart this today until we are sure his renal  function is stable. He is now on colchicine. I have not started allopurinol as it is generally not recommended with acute gout. I will wait for further advice from orthopedics.  Today I have written for full dose steroid taper. We can make decisions about allopurinol as he improves. He is on-call to seen twice daily.  The overall plan now is to continue with the treatment of his left knee pain until he is stable to go home. I have written for full steroid taper. We will need to watch to be sure that he tolerates this. Also he needs further titration of his meds for left ventricular  dysfunction. His overall cardiac status is stable today.   Willa Rough 11/13/2015 8:01 AM

## 2015-11-14 LAB — BASIC METABOLIC PANEL
Anion gap: 9 (ref 5–15)
BUN: 42 mg/dL — AB (ref 6–20)
CHLORIDE: 103 mmol/L (ref 101–111)
CO2: 22 mmol/L (ref 22–32)
CREATININE: 1.53 mg/dL — AB (ref 0.61–1.24)
Calcium: 8.9 mg/dL (ref 8.9–10.3)
GFR calc Af Amer: 54 mL/min — ABNORMAL LOW (ref >60)
GFR calc non Af Amer: 46 mL/min — ABNORMAL LOW (ref >60)
Glucose, Bld: 213 mg/dL — ABNORMAL HIGH (ref 65–99)
Potassium: 4.9 mmol/L (ref 3.5–5.1)
SODIUM: 134 mmol/L — AB (ref 135–145)

## 2015-11-14 LAB — GLUCOSE, CAPILLARY
GLUCOSE-CAPILLARY: 226 mg/dL — AB (ref 65–99)
GLUCOSE-CAPILLARY: 149 mg/dL — AB (ref 65–99)
GLUCOSE-CAPILLARY: 195 mg/dL — AB (ref 65–99)
GLUCOSE-CAPILLARY: 153 mg/dL — AB (ref 65–99)

## 2015-11-14 LAB — CBC
HCT: 34.8 % — ABNORMAL LOW (ref 39.0–52.0)
HEMOGLOBIN: 11.2 g/dL — AB (ref 13.0–17.0)
MCH: 25.3 pg — AB (ref 26.0–34.0)
MCHC: 32.2 g/dL (ref 30.0–36.0)
MCV: 78.7 fL (ref 78.0–100.0)
Platelets: 254 10*3/uL (ref 150–400)
RBC: 4.42 MIL/uL (ref 4.22–5.81)
RDW: 15.3 % (ref 11.5–15.5)
WBC: 15.8 10*3/uL — ABNORMAL HIGH (ref 4.0–10.5)

## 2015-11-14 MED ORDER — IRBESARTAN 75 MG PO TABS
37.5000 mg | ORAL_TABLET | Freq: Every day | ORAL | Status: DC
  Administered 2015-11-14 – 2015-11-15 (×2): 37.5 mg via ORAL
  Filled 2015-11-14 (×2): qty 1

## 2015-11-14 NOTE — Progress Notes (Signed)
Subjective:  His knee is much better today.  He was able to walk to the bathroom but still has some mild swelling and pain in the knee.  No shortness of breath or chest pain.  Prior to coming into the hospital he previously was on metoprolol and valsartan according to the patient.  Objective:  Vital Signs in the last 24 hours: BP 118/68 mmHg  Pulse 70  Temp(Src) 98.2 F (36.8 C) (Oral)  Resp 16  Ht 5\' 9"  (1.753 m)  Wt 133.4 kg (294 lb 1.5 oz)  BMI 43.41 kg/m2  SpO2 100%  Physical Exam: Obese pleasant male currently in no acute distress Lungs:  Clear Cardiac:  Regular rhythm, normal S1 and S2, no S3 Extremities:  Left knee moderately swollen   Intake/Output from previous day: 03/24 0701 - 03/25 0700 In: 1080 [P.O.:1080] Out: 1500 [Urine:1500]  Weight Filed Weights   11/12/15 0532 11/13/15 0421 11/14/15 0401  Weight: 136.8 kg (301 lb 9.4 oz) 132.8 kg (292 lb 12.3 oz) 133.4 kg (294 lb 1.5 oz)    Lab Results: Basic Metabolic Panel:  Recent Labs  16/10/96 0500 11/14/15 0637  NA 133* 134*  K 4.3 4.9  CL 100* 103  CO2 23 22  GLUCOSE 128* 213*  BUN 37* 42*  CREATININE 1.66* 1.53*   CBC:  Recent Labs  11/13/15 0448 11/14/15 0637  WBC 13.2* 15.8*  HGB 11.4* 11.2*  HCT 36.2* 34.8*  MCV 79.0 78.7  PLT 218 254   Telemetry: Sinus rhythm  Assessment/Plan:  1.  Coronary artery disease with ischemic cardiomyopathy, cardiac arrest and chronic systolic heart failure with recent stenting of the right coronary artery in 3 places 2.  Stage III chronic kidney disease-creatinine is stable today and on the decline 3.  Probable gout to the knee which is improved   recommendations:  He was placed on carvedilol.  At home he was on metoprolol.  Needs to decide which will be continued on discharge.  Add back valsartan at a low dose today.  Continue steroids for the gout.   Darden Palmer  MD [facility]  11/14/2015, 9:22 AM

## 2015-11-14 NOTE — Progress Notes (Addendum)
CARDIAC REHAB PHASE I   PRE:  Rate/Rhythm: 69 SR    BP: sitting 110/81    SaO2: 100 RA  MODE:  Ambulation: 290 ft   POST:  Rate/Rhythm: 91 SR    BP: sitting 139/64     SaO2: 100 RA  Pt has been in recliner since he walked with PT. Able to stand independently (gait belt for safety) and use RW to ambulate. No assist needed. No c/o except knee pain. Sts his heart feels great. To bed since his buttocks hurt in chair. Reviewed ed and gave walking gl for home. Family present. Will probably need RW for home. [id number]   Harriet Masson CES, ACSM 11/14/2015 2:46 PM     69 SR

## 2015-11-14 NOTE — Progress Notes (Signed)
Physical Therapy Treatment Patient Details Name: Joshua Larsen MRN: [id number] DOB: 07-28-1951 Today's Date: 11/14/2015    History of Present Illness : Patient underwent successful complex PCI to the right coronary artery yesterday. He received 3 drug-eluting stents. Procedure was done through the right radial and he is stable. He is not having any chest pain. His only complaint at this time is inability to straighten his left leg. He does have some discomfort in the knee. This symptom does not seem to be consistent with his prior history of gout that affected his hands.    PT Comments    Joshua Larsen made excellent progress today as he has found some relief w/ his Lt knee pain.  He ambulated 100 ft using RW w/ min guard assist.  He completed stair training w/ +2 min assist to steady RW.  He is appropriate to d/c from a mobility standpoint, pending medical appropriateness.  Pt is now planning to stay w/ his son and daughter in law at d/c and will have 24/7 assist/supervision available.   Follow Up Recommendations  Home health PT;Supervision for mobility/OOB     Equipment Recommendations  Rolling walker with 5" wheels;3in1 (PT) (Bari 3-in-1 BSC)    Recommendations for Other Services       Precautions / Restrictions Precautions Precautions: Fall Restrictions Weight Bearing Restrictions: No    Mobility  Bed Mobility Overal bed mobility: Modified Independent Bed Mobility: Supine to Sit     Supine to sit: Modified independent (Device/Increase time)     General bed mobility comments: Uses bed rail w/ HOB flat and no assist or cues.  Transfers Overall transfer level: Needs assistance Equipment used: Rolling walker (2 wheeled) Transfers: Sit to/from Stand Sit to Stand: Min guard         General transfer comment: Cues for hand placement and min guard for safety.  Ambulation/Gait Ambulation/Gait assistance: Min guard;+2 safety/equipment Ambulation Distance (Feet): 100  Feet Assistive device: Rolling walker (2 wheeled) Gait Pattern/deviations: Step-to pattern;Step-through pattern;Decreased stride length;Decreased weight shift to left   Gait velocity interpretation: Below normal speed for age/gender General Gait Details: Chair follow for safety.  Dec gait speed but safe technique using RW.  Min guard assist for safety.   Stairs Stairs: Yes Stairs assistance: +2 physical assistance Stair Management: No rails;Backwards;Step to pattern;With walker Number of Stairs: 2 General stair comments: +2 assist to steady RW while pt ascended steps backward and descended forward.  Educated pt on using this technique to enter home and to navigate 2 steps inside home, pt verbalized and demonstrated understanding.  Wheelchair Mobility    Modified Rankin (Stroke Patients Only)       Balance Overall balance assessment: Needs assistance Sitting-balance support: No upper extremity supported;Feet supported Sitting balance-Leahy Scale: Good     Standing balance support: During functional activity Standing balance-Leahy Scale: Fair Standing balance comment: Pt able to stand w/o either UE support but requires RW for dynamic mobility                    Cognition Arousal/Alertness: Awake/alert Behavior During Therapy: WFL for tasks assessed/performed Overall Cognitive Status: Within Functional Limits for tasks assessed                      Exercises      General Comments General comments (skin integrity, edema, etc.): Pt reports he is planning to stay at his son's home at d/c where he will have 24/7 assist/supervision available.  There are 2-3 steps w/o rails to enter home and pt is able to stay in a first floor bedroom.  The are two steps down from family room to level of bedroom w/o a rail.      Pertinent Vitals/Pain Pain Assessment: 0-10 Pain Score: 4  Pain Location: Lt knee Pain Descriptors / Indicators: Aching;Tightness Pain  Intervention(s): Limited activity within patient's tolerance;Monitored during session;Repositioned    Home Living                      Prior Function            PT Goals (current goals can now be found in the care plan section) Acute Rehab PT Goals Patient Stated Goal: to go home PT Goal Formulation: With patient Time For Goal Achievement: 11/24/15 Potential to Achieve Goals: Good Progress towards PT goals: Progressing toward goals    Frequency  Min 3X/week    PT Plan Current plan remains appropriate    Co-evaluation             End of Session Equipment Utilized During Treatment: Gait belt Activity Tolerance: Patient limited by fatigue;Patient limited by pain;Patient tolerated treatment well Patient left: with call bell/phone within reach;in chair;with chair alarm set     Time: 4098-1191 PT Time Calculation (min) (ACUTE ONLY): 23 min  Charges:  $Gait Training: 23-37 mins                    G Codes:      Joshua Larsen PT, DPT  Pager: [phone] Phone: [phone] 11/14/2015, 12:10 PM

## 2015-11-15 DIAGNOSIS — M109 Gout, unspecified: Secondary | ICD-10-CM

## 2015-11-15 LAB — GLUCOSE, CAPILLARY
GLUCOSE-CAPILLARY: 119 mg/dL — AB (ref 65–99)
Glucose-Capillary: 151 mg/dL — ABNORMAL HIGH (ref 65–99)

## 2015-11-15 LAB — CBC
HCT: 33 % — ABNORMAL LOW (ref 39.0–52.0)
Hemoglobin: 10.5 g/dL — ABNORMAL LOW (ref 13.0–17.0)
MCH: 25.3 pg — ABNORMAL LOW (ref 26.0–34.0)
MCHC: 31.8 g/dL (ref 30.0–36.0)
MCV: 79.5 fL (ref 78.0–100.0)
PLATELETS: 258 10*3/uL (ref 150–400)
RBC: 4.15 MIL/uL — ABNORMAL LOW (ref 4.22–5.81)
RDW: 15.6 % — AB (ref 11.5–15.5)
WBC: 13.3 10*3/uL — AB (ref 4.0–10.5)

## 2015-11-15 MED ORDER — COLCHICINE 0.6 MG PO TABS: 0.6000 mg | ORAL_TABLET | Freq: Two times a day (BID) | ORAL | Status: AC

## 2015-11-15 MED ORDER — TICAGRELOR 90 MG PO TABS: 90.0000 mg | ORAL_TABLET | Freq: Two times a day (BID) | ORAL | Status: AC

## 2015-11-15 MED ORDER — PREDNISONE 10 MG PO TABS: 10.0000 mg | ORAL_TABLET | Freq: Every day | ORAL | Status: DC

## 2015-11-15 MED ORDER — CARVEDILOL 3.125 MG PO TABS: 3.1250 mg | ORAL_TABLET | Freq: Two times a day (BID) | ORAL | Status: AC

## 2015-11-15 MED ORDER — IRBESARTAN 75 MG PO TABS: 37.5000 mg | ORAL_TABLET | Freq: Every day | ORAL | Status: DC

## 2015-11-15 MED ORDER — NITROGLYCERIN 0.4 MG SL SUBL: 0.4000 mg | SUBLINGUAL_TABLET | SUBLINGUAL | Status: AC | PRN

## 2015-11-15 MED ORDER — PANTOPRAZOLE SODIUM 40 MG PO TBEC: 40.0000 mg | DELAYED_RELEASE_TABLET | Freq: Two times a day (BID) | ORAL | Status: AC

## 2015-11-15 MED ORDER — TRAMADOL HCL 50 MG PO TABS: 100.0000 mg | ORAL_TABLET | Freq: Two times a day (BID) | ORAL | Status: DC

## 2015-11-15 MED ORDER — ALLOPURINOL 100 MG PO TABS: 100.0000 mg | ORAL_TABLET | Freq: Every day | ORAL | Status: AC

## 2015-11-15 MED ORDER — ATORVASTATIN CALCIUM 80 MG PO TABS: 80.0000 mg | ORAL_TABLET | Freq: Every evening | ORAL | Status: AC

## 2015-11-15 NOTE — Discharge Summary (Signed)
Discharge Summary    Patient ID: Joshua Larsen,  MRN: [id number], DOB/AGE: [birth year] 65 y.o.  Admit date: 11/05/2015 Discharge date: 11/15/2015  Primary Care Provider: Megan Mans Primary Cardiologist: K. Lady Gary, MD [facility])  Discharge Diagnoses    Principal Problem:   Acute non-ST elevation myocardial infarction (NSTEMI) (HCC) Active Problems:   AKI on CKD stage III   Acute systolic CHF   Hypertensive heart disease   CAD (coronary artery disease)   GERD (gastroesophageal reflux disease)   Cardiogenic shock (HCC)   Encounter for management of intra-aortic balloon pump   Left knee pain   History of gout   OSA  Allergies Allergies  Allergen Reactions  . Amoxicillin   . Codeine     GI side effects  . Tagamet [Cimetidine] Other (See Comments)    Causes changes in mental status Causes changes in mental status    Diagnostic Studies/Procedures    Echocardiogram 11/04/15 LV EF: 30% - 35%  ------------------------------------------------------------------- Indications: R07.9 Chest Pain.  ------------------------------------------------------------------- History: PMH: Acquired from the patient and from the patient&'s chart. PMH: Chronic Kidney Disease. Sleep Apnea-CPAP. CAD. Risk factors: Diabetes mellitus. Morbidly obese.  ------------------------------------------------------------------- Study Conclusions  - Left ventricle: The cavity size was mildly dilated. Systolic  function was moderately to severely reduced. The estimated  ejection fraction was in the range of 30% to 35%. Wall motion was  normal; there were no regional wall motion abnormalities. - Mitral valve: There was moderate regurgitation. - Right ventricle: The cavity size was mildly dilated. Wall  thickness was normal.  IABP Insertion   11/05/15   Coronary/Graft Angiography    Conclusion     Prox RCA lesion, 95% stenosed. The lesion was not previously  treated.  Mid RCA lesion, 95% stenosed. The lesion was not previously treated.  Dist RCA lesion, 75% stenosed. The lesion was not previously treated.  Post Atrio lesion, 75% stenosed. The lesion was not previously treated.  LM lesion, 99% stenosed. The lesion was not previously treated.  Ost Cx to Prox Cx lesion, 100% stenosed.  Prox LAD lesion, 100% stenosed.  Ramus lesion, 100% stenosed.  LIMA was injected is normal in caliber, and is anatomically normal.  SVG was injected is normal in caliber.  There is severe focal disease in the graft.  Total occlusion, no flow evidence of dye stagnation. Appears to be recent stenosis. IRA  Mid Graft lesion, 100% stenosed. The lesion was previously treated with a stent (unknown type).  Prox Graft lesion, 100% stenosed. The lesion was not previously treated.  total occlusion of bypass graft resulting in unstable angina cardiogenic shock intra-aortic balloon pump was inserted for transport to tertiary care center   Coronary Stent Intervention 11/09/15    Conclusion    1. Successful Complex PCI of the RCA with DES x 3.  Plan: DAPT for one year at least. Given extensive stenting of the RCA I would consider long term DAPT.       History of Present Illness     65 y/o ? with a h/o CAD s/p remote CABG who presented 11/05/15 on tx from [facility] 2/2 NSTEMI and cardiogenic shock req IABP.  65 y/o ? with the complex PMH. He has a h/o severe multivessel CAD s/p CABG x 5 in 2002. He subsequently suffered a NSTEMI and underwent PCI of the VG  OM1 in 07/2014. Per notes, the LIMA  LAD was patent at that time while the VG  D1 and sequential VG  RPDA  RPL was occluded. He had repeat ath in 08/2014, revealing patency of the VG  OM1. He has been medically managed since. He was last admitted to [facility] in 05/2015 in the setting of postural dizziness and elevated troponin. He was medically managed.   Per outpt clinic notes, he has some degree of chronic,  intermittent exertional angina, which has been nitrate responsive. Over the past several weeks, he has been having more frequent exertional discomfort between his shoulder blades and into his chest. He presented to [facility] on 3/15 secondary to recurrent c/p and was found to have an elevated troponin. This eventually rose to 5.92. He underwent catheterization this AM revealing severe multivessel native and graft dzs, including occlusions of the VG  RPDA  RPL, VG  D1, and a new thrombotic occlusion in the VG  OM1. During the procedure, the pt suffered from a VF arrest requiring precordial thump. He then required IABP placement for hypotension and CGS. He as tx to [facility] for further eval. He continues to c/o 5/10 chest pain. SBP trending in the low 100's on dopamine.   Hospital Course     Consultants: Cardiothoracic Surgery, Heart failure team, Orthopedic  HE was seen by [facility] and felt that he his native vessels are atretic and not suitable for redo grafting.  1. CAD: NSTEMI with hypotension and cardiac arrest. Pt presented to [facility] with progressive chest and back pain and ruled in for NSTEMI, troponin ultimately > 65. Cath 3/16 revealed severe multivessel native CAD and severe graft disease with 1/5 patent grafts (only LIMA-LAD patent) and presumed acute/subacute occlusion of the SVG  Diag1. He suffered a VF arrest during the cath with subsequent development of hypotension/chest pain requiring IABP placement and initiation of dopamine. No good redo surgical option (seen by Dr Donata Clay). Cath films reviewed by Drs Kirke Corin, Croitoru, and Cooper over the past few days. Very difficult interventional candidate but could possibly intervene on RCA.  The patient then underwent successful complex PCI to the RCA receiving 3 drug-eluting stents on November 09, 2015. On November 10, 2015 he was stable. The right radial cath site  stable. Intra-aortic balloon had been taken out and the site stable.  2. Acute  systolic CHF: EF 74-25% on [facility] echo. Cardiogenic shock on dopamine with IABP. Volume overloaded on exam.  PICC to follow CVP and co-ox. Given IV diuresis initially with improved of volume status. He has continued diuresis without diuretics. He also has renal insufficiency. Renal function is remaining stable for him. He was previously metoprolol at home. He was placed on carvedilol here in the presence of cardiogenic shock.Dr. Donnie Aho recommended to send him home on low-dose carvedilol let his primary cardiologist decide if he wanted to change him back to the previous regimen once he is more stable.  Also Dr. Donnie Aho did not think that the patient will need Life Vest/ICD that this time. Reevaluate LV function in 3 months.   3. AKI on CKD stage III: Creatinine at baseline is 1.9.  Scr elevated following cath, with improved later and then remained stable.   4. OSA: Continue CPAP @ night.  5. Left knee pain: Etiology of the left knee discomfort is not clear. His symptoms do not seem to be compatible with his prior gout. This affected his hands. He says he cannot straighten his left knee. Tramadol at low-dose and higher dose has not helped the patient's pain. Due to ongoing pain, the patient was evaluated by orthopedic 11/11/15 due  to left knee pain and swelling s/p TKA in patient with Hx of gout. 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 Subsequent had field block of 1% Xylocaine. 20cc clear yellow synovial fluid obtained, sent for synovial fluid analysis, cell count crystals. Joint fluid did not show any crystals although does not rule out Gout. Cell count goes against infectious etiology. Recommend trial of oral corticosteroids, dose pack or stable dose. Recommended resuming allopurinol when acute flare has resolved. The patient was hesitant because of question of tachycardia related to steroids in the past. He tolerated dose pack with improvement in his knee. Continue taper dose of steroids. Will  need rolling walker and home health PT.   The patient has been seen by Dr.Jennie Hannay  today and deemed ready for discharge home. All follow-up appointments have been scheduled. Discharge medications are listed below.   He was able to walk with physical therapy as well as cardiac rehabilitation and feels much better. No complaints of shortness of breath or chest pain and walked further than he had anticipated. His knee is still sore and he states he is able to walk on it but still has some difficulty bending and is on a tapering course of steroids.  Called for Colchicine prior Approval -  [phone] OPT-5. However no one available over weekend. Given 30 days free script of Brillinta.    Discharge Vitals Blood pressure 126/74, pulse 65, temperature 98.1 F (36.7 C), temperature source Oral, resp. rate 20, height 5\' 9"  (1.753 m), weight 294 lb 15.6 oz (133.8 kg), SpO2 100 %.  Filed Weights   11/13/15 0421 11/14/15 0401 11/15/15 0206  Weight: 292 lb 12.3 oz (132.8 kg) 294 lb 1.5 oz (133.4 kg) 294 lb 15.6 oz (133.8 kg)    Labs & Radiologic Studies     CBC  Recent Labs  11/14/15 0637 11/15/15 0554  WBC 15.8* 13.3*  HGB 11.2* 10.5*  HCT 34.8* 33.0*  MCV 78.7 79.5  PLT 254 258   Basic Metabolic Panel  Recent Labs  11/14/15 0637  NA 134*  K 4.9  CL 103  CO2 22  GLUCOSE 213*  BUN 42*  CREATININE 1.53*  CALCIUM 8.9    Hemoglobin A1C 6.2 11/04/15  Fasting Lipid Panel 06/08/2015: Cholesterol, Total 115; HDL 18*; LDL Calculated 63; Triglycerides 168*    Uric acid  Of 10 11/10/15   Dg Chest 1 View  11/05/2015  CLINICAL DATA:  Check balloon placement.  Cardiac catheterization EXAM: CHEST 1 VIEW COMPARISON:  11/04/2015 FINDINGS: Portable image in cardiac catheterization. There is a metal marker overlying the aortic arch compatible with intra-aortic balloon pump. Cardiac enlargement with CABG. Mild vascular congestion and mild interstitial edema. No pleural effusion. IMPRESSION:  Intra-aortic balloon pump marker at the level the aortic arch Cardiac enlargement with mild heart failure and interstitial edema. These results were called by telephone at the time of interpretation on 11/05/2015 at 9:51 am to [facility] in the cardiac cath lab who verbally acknowledged these results. Electronically Signed   By: Marlan Palau M.D.   On: 11/05/2015 09:52   Dg Chest 2 View  11/04/2015  CLINICAL DATA:  Left-sided chest pain beginning last night. History of prior myocardial infarction. Initial encounter. EXAM: CHEST  2 VIEW COMPARISON:  PA and lateral chest 06/18/2015.  CT chest 09/16/2014. FINDINGS: The patient is status post CABG. Heart size is upper normal. There is no pneumothorax or pleural effusion. No focal airspace disease is identified. Multilevel thoracic spondylosis is seen. Degenerative change  is present about the shoulders. IMPRESSION: No acute disease. Electronically Signed   By: Drusilla Kanner M.D.   On: 11/04/2015 08:49   Dg Knee 1-2 Views Left  11/11/2015  CLINICAL DATA:  Pain EXAM: LEFT KNEE - 1-2 VIEW COMPARISON:  None. FINDINGS: Frontal and lateral views were obtained. The patient is status post total knee replacement with the femoral and tibial prosthetic components appearing well-seated. No fracture or dislocation is evident. There is a moderate joint effusion. There is no erosive change or bony destruction. IMPRESSION: Total knee prosthetic components appear well seated. Moderate joint effusion is present. No bony destruction or erosion evident. No fracture or dislocation evident. Electronically Signed   By: Bretta Bang III M.D.   On: 11/11/2015 15:38   Dg Chest Port 1 View  11/08/2015  CLINICAL DATA:  CHF EXAM: PORTABLE CHEST 1 VIEW COMPARISON:  Chest radiograph from one day prior. FINDINGS: Sternotomy wires appear aligned and intact. Right PICC terminates at the cavoatrial junction. Stable cardiomediastinal silhouette with mild cardiomegaly. No pneumothorax. No  pleural effusion. Moderate pulmonary edema, stable slightly worsened. IMPRESSION: Moderate congestive heart failure, stable to slightly worsened. Electronically Signed   By: Delbert Phenix M.D.   On: 11/08/2015 07:58   Dg Chest Port 1 View  11/07/2015  CLINICAL DATA:  Cough.  Dyspnea. EXAM: PORTABLE CHEST 1 VIEW COMPARISON:  11/05/2015 FINDINGS: There is unchanged cardiomegaly. The lungs are clear. There is no large effusion. Slight fullness of the pulmonary vasculature is unchanged and may be chronic. IMPRESSION: Unchanged cardiomegaly and slight vascular prominence. No interval change from 11/05/2015. Electronically Signed   By: Ellery Plunk M.D.   On: 11/07/2015 04:15   Dg Chest Port 1 View  11/05/2015  CLINICAL DATA:  Balloon pump pulled back. EXAM: PORTABLE CHEST 1 VIEW COMPARISON:  11/05/2015 FINDINGS: Sternotomy wires present. Intra aortic balloon pump is not significantly changed in position and remains over the expected region of the posterior arch at the level of the fourth posterior rib. Lungs are somewhat hypoinflated demonstrate worsening bilateral perihilar opacification right worse than left likely moderate interstitial edema. No evidence of effusion. Cardiomediastinal silhouette and remainder of the exam is unchanged. IMPRESSION: Worsening bilateral perihilar opacification right worse than left likely interstitial edema. Intra aortic balloon pump without significant change as tip appears overlying the expected region of the posterior arch at the level of the fourth posterior rib. Electronically Signed   By: Elberta Fortis M.D.   On: 11/05/2015 17:45   Dg Chest Port 1 View  11/05/2015  CLINICAL DATA:  Status post cardiac catheterization. History of stent placement in left coronary artery. EXAM: PORTABLE CHEST 1 VIEW COMPARISON:  November 05, 2015 FINDINGS: No pneumothorax. The metallic distal tip of the intra-aortic balloon pump terminates at the level of the aortic arch. The distal tip is  approximately is 8 mm from the top of the arch. This is closer to the arch than expected. However, the finding is stable. Stable cardiomegaly. The hila and mediastinum are unchanged. Increasing pulmonary edema. IMPRESSION: 1. The distal tip of the intra-aortic balloon pump is 8 mm from the top of the aortic arch, higher than expected. 2. Increasing pulmonary edema. These results will be called to the ordering clinician or representative by the Radiologist Assistant, and communication documented in the PACS or zVision Dashboard. Electronically Signed   By: Gerome Sam III M.D   On: 11/05/2015 13:04   Dg Chest Port 1v Same Day  11/05/2015  CLINICAL DATA:  Readjusted balloon pump. EXAM: PORTABLE CHEST 1 VIEW COMPARISON:  Chest x-ray from earlier same day. FINDINGS: Marker for the intra-aortic balloon pump is now positioned at the level of the carina. Cardiomegaly is stable. Central pulmonary vascular congestion and mild bilateral interstitial edema appears stable. No new lung findings. No pleural effusion. No pneumothorax seen. IMPRESSION: 1. Marker for intra-aortic balloon pump now positioned at the level of the carina. 2. Cardiomegaly with central pulmonary vascular congestion and bilateral interstitial edema suggesting CHF/volume overload, stable compared to the exam from earlier today. Electronically Signed   By: Bary Richard M.D.   On: 11/05/2015 21:52   Dg Chest Port 1v Same Day  11/05/2015  CLINICAL DATA:  Intra-aortic balloon pump repositioning. Initial encounter. EXAM: PORTABLE CHEST 1 VIEW COMPARISON:  Chest radiograph performed earlier today at 5:22 p.m. FINDINGS: The patient's intra-aortic balloon pump tip is noted 1.5 cm below the level of the carina. Vascular congestion is again noted, with bilateral central airspace opacities, likely reflecting pulmonary edema, improved from the recent prior study. No definite pleural effusion or pneumothorax is seen, though the left costophrenic angle is  incompletely imaged on this study. The cardiomediastinal silhouette is borderline normal in size. The patient is status post median sternotomy. No acute osseous abnormalities are identified. IMPRESSION: 1. Intra-aortic balloon pump tip noted 1.5 cm below the level of the carina. 2. Vascular congestion, with bilateral central airspace opacities, reflecting pulmonary edema, improved from the recent prior study. Electronically Signed   By: Roanna Raider M.D.   On: 11/05/2015 21:23    Disposition   Pt is being discharged home today in good condition.  Follow-up Plans & Appointments    Follow-up Information    Follow up with Megan Mans, MD. Schedule an appointment as soon as possible for a visit in 1 week.   Specialty:  Family Medicine   Why:  for Left knee pain, lab work check   Contact information:   [address] [place] [address] [phone]       Please follow up.   Contact information:   f/u with cardiolgost within 1-2 weeks for hosptial f/u     Discharge Instructions    AMB Referral to Cardiac Rehabilitation - Phase II    Complete by:  As directed   Diagnosis:  Myocardial Infarction     Amb Referral to Cardiac Rehabilitation    Complete by:  As directed   Diagnosis:   Myocardial Infarction PCI       Diet - low sodium heart healthy    Complete by:  As directed      Discharge instructions    Complete by:  As directed   No driving for until cleared by MD.  No lifting over 10 lbs for 4 weeks. No sexual activity for 4 weeks. You may not return to work until cleared by your cardiologist. Keep procedure site clean & dry. If you notice increased pain, swelling, bleeding or pus, call/return!  You may shower, but no soaking baths/hot tubs/pools for 1 week.     Increase activity slowly    Complete by:  As directed            Discharge Medications   Current Discharge Medication List    START taking these medications   Details  allopurinol (ZYLOPRIM)  100 MG tablet Take 1 tablet (100 mg total) by mouth daily. Please call PCP for further refill. Qty: 30 tablet, Refills: 0    carvedilol (COREG)  3.125 MG tablet Take 1 tablet (3.125 mg total) by mouth 2 (two) times daily with a meal. Qty: 60 tablet, Refills: 3    colchicine 0.6 MG tablet Take 1 tablet (0.6 mg total) by mouth 2 (two) times daily. Please call PCP for further refill. Unable to prior autho approval today due to weekend the line was closed (attempted). Office will try later, other wise call PCP. Qty: 14 tablet, Refills: 0    irbesartan (AVAPRO) 75 MG tablet Take 0.5 tablets (37.5 mg total) by mouth daily. Qty: 30 tablet, Refills: 3    nitroGLYCERIN (NITROSTAT) 0.4 MG SL tablet Place 1 tablet (0.4 mg total) under the tongue every 5 (five) minutes x 3 doses as needed for chest pain. Qty: 25 tablet, Refills: 12    pantoprazole (PROTONIX) 40 MG tablet Take 1 tablet (40 mg total) by mouth 2 (two) times daily. Qty: 60 tablet, Refills: 3    predniSONE (DELTASONE) 10 MG tablet Take 1 tablet (10 mg total) by mouth daily with breakfast. Take 1 tablet (10 mg) three time today 11/15/15 (afternoon, evening and night), take 1 tablet  (10mg ) 3/27 three times (morning, afternoon and evening) then take 1 tablet (10mg ) 3/28 two time (mornign and evening) and then take 1 tablet 3/29 to complete taper dose. Qty: 9 tablet, Refills: 0    ticagrelor (BRILINTA) 90 MG TABS tablet Take 1 tablet (90 mg total) by mouth 2 (two) times daily. Qty: 60 tablet, Refills: 11    traMADol (ULTRAM) 50 MG tablet Take 2 tablets (100 mg total) by mouth every 12 (twelve) hours. Qty: 14 tablet, Refills: 0      CONTINUE these medications which have CHANGED   Details  atorvastatin (LIPITOR) 80 MG tablet Take 1 tablet (80 mg total) by mouth every evening. Qty: 30 tablet, Refills: 3      CONTINUE these medications which have NOT CHANGED   Details  aspirin EC 81 MG tablet Take 81 mg by mouth daily.    fenofibrate 160  MG tablet Take 1 tablet (160 mg total) by mouth daily. Qty: 30 tablet, Refills: 12    isosorbide mononitrate (IMDUR) 60 MG 24 hr tablet Take 60 mg by mouth 2 (two) times daily. Refills: 5      STOP taking these medications     amiodarone (NEXTERONE PREMIX) 360 MG/200ML SOLN      amiodarone (NEXTERONE PREMIX) 360 MG/200ML SOLN      heparin 100-0.45 UNIT/ML-% infusion      metoprolol tartrate (LOPRESSOR) 25 MG tablet      morphine 2 MG/ML injection      nitroGLYCERIN (NITROGLYN) 2 % ointment      ondansetron (ZOFRAN) 4 MG/2ML SOLN injection      pantoprazole (PROTONIX) 40 MG injection      ranolazine (RANEXA) 500 MG 12 hr tablet         Aspirin prescribed at discharge?  Yes High Intensity Statin Prescribed? (Lipitor 40-80mg  or Crestor 20-40mg ): Yes Beta Blocker Prescribed? Yes For EF 45% or less, Was ACEI/ARB Prescribed? Yes ADP Receptor Inhibitor Prescribed? (i.e. Plavix etc.-Includes Medically Managed Patients): Yes For EF <40%, Aldosterone Inhibitor Prescribed? N/A Was EF assessed during THIS hospitalization? Yes Was Cardiac Rehab II ordered? (Included Medically managed Patients): Yes   Outstanding Labs/Studies   BMET, CBC and uric acid within a week Echo in 3 months Lipid panel and LFT in 6 weeks  Duration of Discharge Encounter   Greater than 30 minutes including physician time.  Signed, [employer]  PA-C 11/15/2015, 9:43 AM

## 2015-11-15 NOTE — Progress Notes (Signed)
Cm received call from RN requesting rolling walker.  CM called [facility] DME rep, Trey Paula to please deliver the rolling walker to room so pt can discharge.  Pt refusing HHPT as he attributes his deficit from gout and has stated he will be fine once the gout clears up.  No other CM needs were communicated.

## 2015-11-15 NOTE — Progress Notes (Signed)
Subjective:  He was able to walk with physical therapy yesterday as well as cardiac rehabilitation and feels much better.  No complaints of shortness of breath or chest pain and walked further than he had anticipated.  He would like to go home today if possible.  His knee is still sore and he states he is able to walk on it but still has some difficulty bending and is on a tapering course of steroids.  Objective:  Vital Signs in the last 24 hours: BP 118/65 mmHg  Pulse 65  Temp(Src) 98.1 F (36.7 C) (Oral)  Resp 20  Ht 5\' 9"  (1.753 m)  Wt 133.8 kg (294 lb 15.6 oz)  BMI 43.54 kg/m2  SpO2 100%  Physical Exam: Obese pleasant male currently in no acute distress Lungs:  Clear Cardiac:  Regular rhythm, normal S1 and S2, no S3 Extremities:  Left knee moderately swollen   Intake/Output from previous day: 03/25 0701 - 03/26 0700 In: 810 [P.O.:780; I.V.:30] Out: 1005 [Urine:1002; Stool:3]  Weight Filed Weights   11/13/15 0421 11/14/15 0401 11/15/15 0206  Weight: 132.8 kg (292 lb 12.3 oz) 133.4 kg (294 lb 1.5 oz) 133.8 kg (294 lb 15.6 oz)    Lab Results: Basic Metabolic Panel:  Recent Labs  57/90/38 0637  NA 134*  K 4.9  CL 103  CO2 22  GLUCOSE 213*  BUN 42*  CREATININE 1.53*   CBC:  Recent Labs  11/14/15 0637 11/15/15 0554  WBC 15.8* 13.3*  HGB 11.2* 10.5*  HCT 34.8* 33.0*  MCV 78.7 79.5  PLT 254 258   Telemetry: Sinus rhythm  Assessment/Plan:  1.  Coronary artery disease with ischemic cardiomyopathy, cardiac arrest and chronic systolic heart failure with recent stenting of the right coronary artery in 3 places 2.  Stage III chronic kidney disease-creatinine was stable yesterday  3.  Probable gout to the knee which is improving  Recommendations:  He was previously on valsartan and metoprolol at home.  He was placed on carvedilol here in the presence of cardiogenic shock.  I think I would send him home on low-dose carvedilol as well as valsartan and let his  primary cardiologist decide if he wanted to change him back to the previous regimen once he is more stable.  He will need to be discharged on a tapering dose of steroids because of his knee.  He will need a rolling walker when he goes home and need early follow-up with his cardiologist in [place].  Dual antiplatelet therapy likely necessary for life because of the extent of his stents.   Darden Palmer  MD [facility]  11/15/2015, 8:03 AM

## 2015-11-15 NOTE — Progress Notes (Signed)
Pt given Brillanta at 1620 due to unable to fill prescription today. Pt discharged with son without difficulty. Emelda Brothers RN

## 2015-11-16 LAB — CULTURE, BODY FLUID W GRAM STAIN -BOTTLE: Culture: NO GROWTH

## 2015-11-16 LAB — CULTURE, BODY FLUID-BOTTLE

## 2015-11-23 ENCOUNTER — Telehealth: Payer: Self-pay | Admitting: Family Medicine

## 2015-11-23 NOTE — Telephone Encounter (Signed)
Spoke with patient, he is doing better compared to when he went to the hospital. Patient states he is weak, and that a lot of medications have been changed and he has a follow up with [place] and Dr. Lady Gary and some medications will be managed by [place] and some by cardiologist. He is stable at the moment, advised patient to take it easy and not push himself to getting back to his normal before he had MI.-aa

## 2015-11-23 NOTE — Telephone Encounter (Signed)
Pt was discharged from [facility] 11/15/2015 for chest pain/heart attack.  I have scheduled a hospital follow up appointment/MW

## 2015-11-25 ENCOUNTER — Encounter: Payer: Self-pay | Admitting: Family Medicine

## 2015-11-25 ENCOUNTER — Ambulatory Visit (INDEPENDENT_AMBULATORY_CARE_PROVIDER_SITE_OTHER): Payer: Medicare Other | Admitting: Family Medicine

## 2015-11-25 VITALS — BP 102/58 | HR 70 | Temp 97.6°F | Resp 18 | Wt 302.0 lb

## 2015-11-25 DIAGNOSIS — N183 Chronic kidney disease, stage 3 (moderate): Secondary | ICD-10-CM | POA: Diagnosis not present

## 2015-11-25 DIAGNOSIS — Z09 Encounter for follow-up examination after completed treatment for conditions other than malignant neoplasm: Secondary | ICD-10-CM | POA: Diagnosis not present

## 2015-11-25 DIAGNOSIS — E785 Hyperlipidemia, unspecified: Secondary | ICD-10-CM | POA: Diagnosis not present

## 2015-11-25 DIAGNOSIS — R570 Cardiogenic shock: Secondary | ICD-10-CM

## 2015-11-25 DIAGNOSIS — M1000 Idiopathic gout, unspecified site: Secondary | ICD-10-CM | POA: Diagnosis not present

## 2015-11-25 DIAGNOSIS — I10 Essential (primary) hypertension: Secondary | ICD-10-CM | POA: Diagnosis not present

## 2015-11-25 DIAGNOSIS — I214 Non-ST elevation (NSTEMI) myocardial infarction: Secondary | ICD-10-CM | POA: Diagnosis not present

## 2015-11-25 DIAGNOSIS — R778 Other specified abnormalities of plasma proteins: Secondary | ICD-10-CM

## 2015-11-25 DIAGNOSIS — R7989 Other specified abnormal findings of blood chemistry: Secondary | ICD-10-CM | POA: Diagnosis not present

## 2015-11-25 DIAGNOSIS — I2510 Atherosclerotic heart disease of native coronary artery without angina pectoris: Secondary | ICD-10-CM

## 2015-11-25 DIAGNOSIS — E1122 Type 2 diabetes mellitus with diabetic chronic kidney disease: Secondary | ICD-10-CM

## 2015-11-25 NOTE — Progress Notes (Signed)
Patient ID: Joshua Larsen, male   DOB: 06-02-1951, 65 y.o.   MRN: [id number]    Subjective:  HPI Date of admission 11/04/15 Date of discharge for [facility]- 10/26/15 Pt was transported to [facility], heart care until 11/15/15  Pt went to ER for chest pain at [facility]. Troponin levels were elevated. Cardiac cath was done were pt went into A-fib arrest on cath table. Pt was transferred to [facility] heart health. Dr. [place] placed a stent on 11/09/15. Pt has since been stable.   Pt had knee pain that started in the hospital. Xrays were done and was ruled as a gout flare since he was unable to take his allopurinol through the hospital coarse. He was treated with prednisone and resuming his allopurinol.  Since the hospital pt reports that today he feels a little dizzy and light headed but has been his way on and off since the hospital. He also still has knee pain.   Prior to Admission medications   Medication Sig Start Date End Date Taking? Authorizing Provider  allopurinol (ZYLOPRIM) 100 MG tablet Take 1 tablet (100 mg total) by mouth daily. Please call PCP for further refill. 11/15/15  Yes Bhavinkumar Bhagat, PA  aspirin EC 81 MG tablet Take 81 mg by mouth daily.   Yes Historical Provider, MD  atorvastatin (LIPITOR) 80 MG tablet Take 1 tablet (80 mg total) by mouth every evening. 11/15/15  Yes Bhavinkumar Bhagat, PA  carvedilol (COREG) 3.125 MG tablet Take 1 tablet (3.125 mg total) by mouth 2 (two) times daily with a meal. 11/15/15  Yes Bhavinkumar Bhagat, PA  colchicine 0.6 MG tablet Take 1 tablet (0.6 mg total) by mouth 2 (two) times daily. Please call PCP for further refill. Unable to prior autho approval today due to weekend the line was closed (attempted). Office will try later, other wise call PCP. 11/15/15  Yes Bhavinkumar Bhagat, PA  fenofibrate 160 MG tablet Take 1 tablet (160 mg total) by mouth daily. 05/14/15  Yes Richard Hulen Shouts., MD  irbesartan (AVAPRO) 75 MG tablet Take 0.5 tablets (37.5 mg total) by  mouth daily. 11/15/15  Yes Bhavinkumar Bhagat, PA  isosorbide mononitrate (IMDUR) 60 MG 24 hr tablet Take 60 mg by mouth 2 (two) times daily.   Yes Historical Provider, MD  nitroGLYCERIN (NITROSTAT) 0.4 MG SL tablet Place 1 tablet (0.4 mg total) under the tongue every 5 (five) minutes x 3 doses as needed for chest pain. 11/15/15  Yes Bhavinkumar Bhagat, PA  pantoprazole (PROTONIX) 40 MG tablet Take 1 tablet (40 mg total) by mouth 2 (two) times daily. 11/15/15  Yes Bhavinkumar Bhagat, PA  ticagrelor (BRILINTA) 90 MG TABS tablet Take 1 tablet (90 mg total) by mouth 2 (two) times daily. 11/15/15  Yes Bhavinkumar Bhagat, PA  traMADol (ULTRAM) 50 MG tablet Take 2 tablets (100 mg total) by mouth every 12 (twelve) hours. 11/15/15  Yes Bhavinkumar Bhagat, PA  predniSONE (DELTASONE) 10 MG tablet Take 1 tablet (10 mg total) by mouth daily with breakfast. Take 1 tablet (10 mg) three time today 11/15/15 (afternoon, evening and night), take 1 tablet  (10mg ) 3/27 three times (morning, afternoon and evening) then take 1 tablet (10mg ) 3/28 two time (mornign and evening) and then take 1 tablet 3/29 to complete taper dose. Patient not taking: Reported on 11/25/2015 11/15/15   Manson Passey, PA    Patient Active Problem List   Diagnosis Date Noted  . Acute gout 11/15/2015  . Left knee pain 11/10/2015  . History  of gout 11/10/2015  . Acute non-ST elevation myocardial infarction (NSTEMI) (HCC)   . Cardiogenic shock (HCC) 11/05/2015  . Hypertensive heart disease   . CAD (coronary artery disease)   . GERD (gastroesophageal reflux disease)   . Encounter for management of intra-aortic balloon pump   . Unstable angina (HCC) 11/04/2015  . Cerebral amyloid angiopathy 09/23/2015  . Dizziness and giddiness 06/20/2015  . Symptomatic sinus bradycardia 06/20/2015  . HTN (hypertension) 06/20/2015  . Diabetes (HCC) 06/20/2015  . CKD stage 3 secondary to diabetes (HCC) 06/20/2015  . OSA (obstructive sleep apnea) 06/20/2015  .  Elevated troponin 06/18/2015  . Type 2 diabetes mellitus (HCC) 06/10/2015  . Alopecia 02/11/2015  . Adaptation reaction 02/11/2015  . Acute MI (HCC) 02/11/2015  . Arteriosclerosis of coronary artery 02/11/2015  . Clinical depression 02/11/2015  . Diabetes mellitus, type 2 (HCC) 02/11/2015  . Acid reflux 02/11/2015  . Gout 02/11/2015  . Hepatitis A 02/11/2015  . Asthma 02/11/2015  . HLD (hyperlipidemia) 02/11/2015  . BP (high blood pressure) 02/11/2015  . Cannot sleep 02/11/2015  . Arthritis, degenerative 02/11/2015  . Adiposity 02/11/2015  . Acute kidney failure (HCC) 02/11/2015  . Apnea, sleep 02/11/2015  . Calculus of kidney 12/25/2013  . CAD in native artery 12/20/2013    Past Medical History  Diagnosis Date  . Hypertensive heart disease   . CAD (coronary artery disease)     a. 08/2000 s/p CABG x 5 (LIMA->LAD, VG->Diag, VG->dRCA->RPL, VG->OM); b. 07/2014 PCI VG->OM, VG->Diag 100, VG->PDA->RPL 100;  c. 08/2014 Cath: patent stent in VG->OM1->Med Rx; d. 10/2015 NSTEMI/VF arrest/CG Shock.  Marland Kitchen GERD (gastroesophageal reflux disease)   . CKD (chronic kidney disease), stage III   . Sleep apnea     a. On CPAP.  Marland Kitchen Chronic stable angina (HCC)   . Diet-controlled diabetes mellitus [facility])     Social History   Social History  . Marital Status: Divorced    Spouse Name: divorced  . Number of Children: 1  . Years of Education: 14   Occupational History  . retired    Social History Main Topics  . Smoking status: Never Smoker   . Smokeless tobacco: Never Used  . Alcohol Use: No  . Drug Use: No  . Sexual Activity: No   Other Topics Concern  . Not on file   Social History Narrative   Lives at home. He is retired now. 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    Allergies  Allergen Reactions  . Amoxicillin   . Codeine     GI side effects  . Tagamet [Cimetidine] Other (See Comments)    Causes changes in mental status Causes changes in mental status    Review of Systems    Constitutional: Negative.   HENT: Negative.   Eyes: Negative.   Respiratory: Negative.   Cardiovascular: Negative.   Gastrointestinal: Negative.   Genitourinary: Negative.   Musculoskeletal: Positive for joint pain.  Skin: Negative.   Neurological: Positive for dizziness.  Endo/Heme/Allergies: Negative.   Psychiatric/Behavioral: Negative.     Immunization History  Administered Date(s) Administered  . Pneumococcal Polysaccharide-23 03/15/2012  . Tdap 06/06/2007   Objective:  BP 102/58 mmHg  Pulse 70  Temp(Src) 97.6 F (36.4 C) (Oral)  Resp 18  Wt 302 lb (136.986 kg)  SpO2 98%  Physical Exam  Constitutional: He is oriented to person, place, and time and well-developed, well-nourished, and in no distress.  Morbidly obese white male in no acute distress today  HENT:  Head: Normocephalic and atraumatic.  Right Ear: External ear normal.  Left Ear: External ear normal.  Nose: Nose normal.  Eyes: Conjunctivae and EOM are normal. Pupils are equal, round, and reactive to light.  Neck: Normal range of motion. Neck supple.  Cardiovascular: Normal rate, regular rhythm, normal heart sounds and intact distal pulses.   Pulmonary/Chest: Effort normal and breath sounds normal.  Abdominal: Soft.  Musculoskeletal: He exhibits edema (1+ right and 2+ left).  Warmer on left knee than right knee.   Neurological: He is alert and oriented to person, place, and time. He has normal reflexes. Gait normal. GCS score is 15.  Skin: Skin is warm and dry.  Psychiatric: Mood, memory, affect and judgment normal.    Lab Results  Component Value Date   WBC 13.3* 11/15/2015   HGB 10.5* 11/15/2015   HCT 33.0* 11/15/2015   PLT 258 11/15/2015   GLUCOSE 213* 11/14/2015   CHOL 115 06/08/2015   TRIG 168* 06/08/2015   HDL 18* 06/08/2015   LDLCALC 63 06/08/2015   TSH 0.897 06/10/2015   PSA 1.7 11/08/2013   INR 1.12 11/04/2015   HGBA1C 6.2* 11/04/2015    CMP     Component Value Date/Time   NA  134* 11/14/2015 0637   NA 142 06/08/2015 1137   NA 140 09/17/2014 0339   K 4.9 11/14/2015 0637   K 4.6 09/17/2014 0339   CL 103 11/14/2015 0637   CL 106 09/17/2014 0339   CO2 22 11/14/2015 0637   CO2 26 09/17/2014 0339   GLUCOSE 213* 11/14/2015 0637   GLUCOSE 146* 06/08/2015 1137   GLUCOSE 122* 09/17/2014 0339   BUN 42* 11/14/2015 0637   BUN 32* 06/08/2015 1137   BUN 26* 09/17/2014 0339   CREATININE 1.53* 11/14/2015 0637   CREATININE 1.70* 09/17/2014 0339   CREATININE 1.8* 11/08/2013   CALCIUM 8.9 11/14/2015 0637   CALCIUM 9.1 09/17/2014 0339   PROT 7.4 06/18/2015 1748   PROT 6.9 06/08/2015 1137   PROT 8.5* 12/13/2013 1159   ALBUMIN 4.0 06/18/2015 1748   ALBUMIN 4.3 06/08/2015 1137   ALBUMIN 3.8 12/13/2013 1159   AST 24 06/18/2015 1748   AST 15 12/13/2013 1159   ALT 24 06/18/2015 1748   ALT 21 12/13/2013 1159   ALKPHOS 33* 06/18/2015 1748   ALKPHOS 32* 12/13/2013 1159   BILITOT 0.7 06/18/2015 1748   BILITOT 0.5 06/08/2015 1137   BILITOT 0.8 12/13/2013 1159   GFRNONAA 46* 11/14/2015 0637   GFRNONAA 43* 09/17/2014 0339   GFRNONAA 28* 12/16/2013 0725   GFRAA 54* 11/14/2015 0637   GFRAA 53* 09/17/2014 0339   GFRAA 33* 12/16/2013 0725    Assessment and Plan :  1. Acute non-ST elevation myocardial infarction (NSTEMI) (HCC) MI was treated successfully at [facility].  2. Essential hypertension Well-controlled. Borderline hypotensive today. - CBC with Differential/Platelet - TSH  3. Arteriosclerosis of coronary artery Ischemic cardiomyopathy with EF of 35% Continue to maximize medical therapy. 4. Cardiogenic shock (HCC)  5. CKD stage 3 secondary to diabetes (HCC)   6. Elevated troponin Acute event resolved.  7. HLD (hyperlipidemia)  - Lipid Panel With LDL/HDL Ratio - Comprehensive metabolic panel  8. Hospital discharge follow-up   9. Idiopathic gout, unspecified chronicity, unspecified site  - Uric acid   Patient was seen and examined by Dr. Julieanne Manson, and noted scribed by Dimas Chyle, CMA I have done the exam and reviewed the above chart and it is accurate to the best  of my knowledge.  Julieanne Manson MD [facility] 11/25/2015 2:18 PM

## 2015-11-26 ENCOUNTER — Telehealth: Payer: Self-pay | Admitting: Family Medicine

## 2015-11-26 NOTE — Telephone Encounter (Signed)
Pt is requesting a Rx that he has not had before.  Pt is having diarrhea and his bottom is raw and sore.  Pt states his sister advised him to use Protocol cream to help with this.  [facility].  CB#[phone]/MW

## 2015-11-26 NOTE — Telephone Encounter (Signed)
Ok to try--there are OTC meds that will work for this.

## 2015-11-26 NOTE — Telephone Encounter (Signed)
Try utter cream.

## 2015-11-26 NOTE — Telephone Encounter (Signed)
Please review. Thanks!  

## 2015-11-26 NOTE — Telephone Encounter (Signed)
Can you list the other OTC meds that can help with patient's symptoms? Patient reports that proctocol cream is too expensive. Or is there something we can call in that will help? Please advise. Thanks!

## 2015-11-27 NOTE — Telephone Encounter (Signed)
Patient advised  ED 

## 2016-01-26 ENCOUNTER — Ambulatory Visit: Payer: Medicare Other | Admitting: Family Medicine

## 2016-02-27 ENCOUNTER — Emergency Department
Admission: EM | Admit: 2016-02-27 | Discharge: 2016-02-27 | Disposition: A | Discharge status: Home / Self Care | Payer: Medicare Other | Attending: Emergency Medicine | Admitting: Emergency Medicine

## 2016-02-27 ENCOUNTER — Encounter: Payer: Self-pay | Admitting: Emergency Medicine

## 2016-02-27 ENCOUNTER — Emergency Department: Payer: Medicare Other

## 2016-02-27 DIAGNOSIS — Z951 Presence of aortocoronary bypass graft: Secondary | ICD-10-CM | POA: Insufficient documentation

## 2016-02-27 DIAGNOSIS — E7800 Pure hypercholesterolemia, unspecified: Secondary | ICD-10-CM | POA: Insufficient documentation

## 2016-02-27 DIAGNOSIS — R05 Cough: Secondary | ICD-10-CM | POA: Diagnosis not present

## 2016-02-27 DIAGNOSIS — N183 Chronic kidney disease, stage 3 (moderate): Secondary | ICD-10-CM | POA: Diagnosis not present

## 2016-02-27 DIAGNOSIS — R059 Cough, unspecified: Secondary | ICD-10-CM

## 2016-02-27 DIAGNOSIS — Z7982 Long term (current) use of aspirin: Secondary | ICD-10-CM | POA: Insufficient documentation

## 2016-02-27 DIAGNOSIS — R079 Chest pain, unspecified: Secondary | ICD-10-CM | POA: Diagnosis not present

## 2016-02-27 DIAGNOSIS — E1122 Type 2 diabetes mellitus with diabetic chronic kidney disease: Secondary | ICD-10-CM | POA: Insufficient documentation

## 2016-02-27 DIAGNOSIS — J45909 Unspecified asthma, uncomplicated: Secondary | ICD-10-CM | POA: Insufficient documentation

## 2016-02-27 DIAGNOSIS — I252 Old myocardial infarction: Secondary | ICD-10-CM | POA: Diagnosis not present

## 2016-02-27 DIAGNOSIS — I2510 Atherosclerotic heart disease of native coronary artery without angina pectoris: Secondary | ICD-10-CM | POA: Diagnosis not present

## 2016-02-27 DIAGNOSIS — M1990 Unspecified osteoarthritis, unspecified site: Secondary | ICD-10-CM | POA: Diagnosis not present

## 2016-02-27 DIAGNOSIS — I1310 Hypertensive heart and chronic kidney disease without heart failure, with stage 1 through stage 4 chronic kidney disease, or unspecified chronic kidney disease: Secondary | ICD-10-CM | POA: Diagnosis not present

## 2016-02-27 LAB — CBC
HCT: 34.9 % — ABNORMAL LOW (ref 40.0–52.0)
Hemoglobin: 11.8 g/dL — ABNORMAL LOW (ref 13.0–18.0)
MCH: 26.3 pg (ref 26.0–34.0)
MCHC: 33.8 g/dL (ref 32.0–36.0)
MCV: 77.7 fL — AB (ref 80.0–100.0)
PLATELETS: 168 10*3/uL (ref 150–440)
RBC: 4.49 MIL/uL (ref 4.40–5.90)
RDW: 17.2 % — ABNORMAL HIGH (ref 11.5–14.5)
WBC: 8.3 10*3/uL (ref 3.8–10.6)

## 2016-02-27 LAB — COMPREHENSIVE METABOLIC PANEL
ALT: 23 U/L (ref 17–63)
ANION GAP: 9 (ref 5–15)
AST: 25 U/L (ref 15–41)
Albumin: 4.3 g/dL (ref 3.5–5.0)
Alkaline Phosphatase: 38 U/L (ref 38–126)
BUN: 32 mg/dL — ABNORMAL HIGH (ref 6–20)
CALCIUM: 9.1 mg/dL (ref 8.9–10.3)
CHLORIDE: 108 mmol/L (ref 101–111)
CO2: 23 mmol/L (ref 22–32)
CREATININE: 1.83 mg/dL — AB (ref 0.61–1.24)
GFR, EST AFRICAN AMERICAN: 43 mL/min — AB (ref >60)
GFR, EST NON AFRICAN AMERICAN: 37 mL/min — AB (ref >60)
Glucose, Bld: 195 mg/dL — ABNORMAL HIGH (ref 65–99)
Potassium: 4 mmol/L (ref 3.5–5.1)
SODIUM: 140 mmol/L (ref 135–145)
Total Bilirubin: 0.7 mg/dL (ref 0.3–1.2)
Total Protein: 7.4 g/dL (ref 6.5–8.1)

## 2016-02-27 LAB — TROPONIN I

## 2016-02-27 MED ORDER — HYDROCOD POLST-CPM POLST ER 10-8 MG/5ML PO SUER: 5.0000 mL | Freq: Two times a day (BID) | ORAL | Status: DC

## 2016-02-27 MED ORDER — HYDROCOD POLST-CPM POLST ER 10-8 MG/5ML PO SUER
5.0000 mL | Freq: Once | ORAL | Status: AC
  Administered 2016-02-27: 5 mL via ORAL
  Filled 2016-02-27: qty 5

## 2016-02-27 MED ORDER — ISOSORBIDE MONONITRATE ER 30 MG PO TB24: 90.0000 mg | ORAL_TABLET | Freq: Every day | ORAL | Status: AC

## 2016-02-27 NOTE — Discharge Instructions (Signed)
Nonspecific Chest Pain  °Chest pain can be caused by many different conditions. There is always a chance that your pain could be related to something serious, such as a heart attack or a blood clot in your lungs. Chest pain can also be caused by conditions that are not life-threatening. If you have chest pain, it is very important to follow up with your health care provider. °CAUSES  °Chest pain can be caused by: °· Heartburn. °· Pneumonia or bronchitis. °· Anxiety or stress. °· Inflammation around your heart (pericarditis) or lung (pleuritis or pleurisy). °· A blood clot in your lung. °· A collapsed lung (pneumothorax). It can develop suddenly on its own (spontaneous pneumothorax) or from trauma to the chest. °· Shingles infection (varicella-zoster virus). °· Heart attack. °· Damage to the bones, muscles, and cartilage that make up your chest wall. This can include: °¨ Bruised bones due to injury. °¨ Strained muscles or cartilage due to frequent or repeated coughing or overwork. °¨ Fracture to one or more ribs. °¨ Sore cartilage due to inflammation (costochondritis). °RISK FACTORS  °Risk factors for chest pain may include: °· Activities that increase your risk for trauma or injury to your chest. °· Respiratory infections or conditions that cause frequent coughing. °· Medical conditions or overeating that can cause heartburn. °· Heart disease or family history of heart disease. °· Conditions or health behaviors that increase your risk of developing a blood clot. °· Having had chicken pox (varicella zoster). °SIGNS AND SYMPTOMS °Chest pain can feel like: °· Burning or tingling on the surface of your chest or deep in your chest. °· Crushing, pressure, aching, or squeezing pain. °· Dull or sharp pain that is worse when you move, cough, or take a deep breath. °· Pain that is also felt in your back, neck, shoulder, or arm, or pain that spreads to any of these areas. °Your chest pain may come and go, or it may stay  constant. °DIAGNOSIS °Lab tests or other studies may be needed to find the cause of your pain. Your health care provider may have you take a test called an ambulatory ECG (electrocardiogram). An ECG records your heartbeat patterns at the time the test is performed. You may also have other tests, such as: °· Transthoracic echocardiogram (TTE). During echocardiography, sound waves are used to create a picture of all of the heart structures and to look at how blood flows through your heart. °· Transesophageal echocardiogram (TEE). This is a more advanced imaging test that obtains images from inside your body. It allows your health care provider to see your heart in finer detail. °· Cardiac monitoring. This allows your health care provider to monitor your heart rate and rhythm in real time. °· Holter monitor. This is a portable device that records your heartbeat and can help to diagnose abnormal heartbeats. It allows your health care provider to track your heart activity for several days, if needed. °· Stress tests. These can be done through exercise or by taking medicine that makes your heart beat more quickly. °· Blood tests. °· Imaging tests. °TREATMENT  °Your treatment depends on what is causing your chest pain. Treatment may include: °· Medicines. These may include: °¨ Acid blockers for heartburn. °¨ Anti-inflammatory medicine. °¨ Pain medicine for inflammatory conditions. °¨ Antibiotic medicine, if an infection is present. °¨ Medicines to dissolve blood clots. °¨ Medicines to treat coronary artery disease. °· Supportive care for conditions that do not require medicines. This may include: °¨ Resting. °¨ Applying heat   or cold packs to injured areas. °¨ Limiting activities until pain decreases. °HOME CARE INSTRUCTIONS °· If you were prescribed an antibiotic medicine, finish it all even if you start to feel better. °· Avoid any activities that bring on chest pain. °· Do not use any tobacco products, including  cigarettes, chewing tobacco, or electronic cigarettes. If you need help quitting, ask your health care provider. °· Do not drink alcohol. °· Take medicines only as directed by your health care provider. °· Keep all follow-up visits as directed by your health care provider. This is important. This includes any further testing if your chest pain does not go away. °· If heartburn is the cause for your chest pain, you may be told to keep your head raised (elevated) while sleeping. This reduces the chance that acid will go from your stomach into your esophagus. °· Make lifestyle changes as directed by your health care provider. These may include: °¨ Getting regular exercise. Ask your health care provider to suggest some activities that are safe for you. °¨ Eating a heart-healthy diet. A registered dietitian can help you to learn healthy eating options. °¨ Maintaining a healthy weight. °¨ Managing diabetes, if necessary. °¨ Reducing stress. °SEEK MEDICAL CARE IF: °· Your chest pain does not go away after treatment. °· You have a rash with blisters on your chest. °· You have a fever. °SEEK IMMEDIATE MEDICAL CARE IF:  °· Your chest pain is worse. °· You have an increasing cough, or you cough up blood. °· You have severe abdominal pain. °· You have severe weakness. °· You faint. °· You have chills. °· You have sudden, unexplained chest discomfort. °· You have sudden, unexplained discomfort in your arms, back, neck, or jaw. °· You have shortness of breath at any time. °· You suddenly start to sweat, or your skin gets clammy. °· You feel nauseous or you vomit. °· You suddenly feel light-headed or dizzy. °· Your heart begins to beat quickly, or it feels like it is skipping beats. °These symptoms may represent a serious problem that is an emergency. Do not wait to see if the symptoms will go away. Get medical help right away. Call your local emergency services (911 in the [place] Do not drive yourself to the hospital. °  °This  information is not intended to replace advice given to you by your health care provider. Make sure you discuss any questions you have with your health care provider. °  °Document Released: 05/18/2005 Document Revised: 08/29/2014 Document Reviewed: 03/14/2014 °Elsevier Interactive Patient Education ©2016 [employer] °Cough, Adult °Coughing is a reflex that clears your throat and your airways. Coughing helps to heal and protect your lungs. It is normal to cough occasionally, but a cough that happens with other symptoms or lasts a long time may be a sign of a condition that needs treatment. A cough may last only 2-3 weeks (acute), or it may last longer than 8 weeks (chronic). °CAUSES °Coughing is commonly caused by: °· Breathing in substances that irritate your lungs. °· A viral or bacterial respiratory infection. °· Allergies. °· Asthma. °· Postnasal drip. °· Smoking. °· Acid backing up from the stomach into the esophagus (gastroesophageal reflux). °· Certain medicines. °· Chronic lung problems, including COPD (or rarely, lung cancer). °· Other medical conditions such as heart failure. °HOME CARE INSTRUCTIONS  °Pay attention to any changes in your symptoms. Take these actions to help with your discomfort: °· Take medicines only as told by your health care   provider. °· If you were prescribed an antibiotic medicine, take it as told by your health care provider. Do not stop taking the antibiotic even if you start to feel better. °· Talk with your health care provider before you take a cough suppressant medicine. °· Drink enough fluid to keep your urine clear or pale yellow. °· If the air is dry, use a cold steam vaporizer or humidifier in your bedroom or your home to help loosen secretions. °· Avoid anything that causes you to cough at work or at home. °· If your cough is worse at night, try sleeping in a semi-upright position. °· Avoid cigarette smoke. If you smoke, quit smoking. If you need help quitting, ask your  health care provider. °· Avoid caffeine. °· Avoid alcohol. °· Rest as needed. °SEEK MEDICAL CARE IF:  °· You have new symptoms. °· You cough up pus. °· Your cough does not get better after 2-3 weeks, or your cough gets worse. °· You cannot control your cough with suppressant medicines and you are losing sleep. °· You develop pain that is getting worse or pain that is not controlled with pain medicines. °· You have a fever. °· You have unexplained weight loss. °· You have night sweats. °SEEK IMMEDIATE MEDICAL CARE IF: °· You cough up blood. °· You have difficulty breathing. °· Your heartbeat is very fast. °  °This information is not intended to replace advice given to you by your health care provider. Make sure you discuss any questions you have with your health care provider. °  °Document Released: 02/04/2011 Document Revised: 04/29/2015 Document Reviewed: 10/15/2014 °Elsevier Interactive Patient Education ©2016 [employer] Inc[employer] ° °

## 2016-02-27 NOTE — ED Notes (Signed)
About 1 am began chest pain. Frequent cough x 2 days.

## 2016-02-27 NOTE — ED Provider Notes (Signed)
[facility] Emergency Department Provider Note        Time seen: ----------------------------------------- 3:40 PM on 02/27/2016 -----------------------------------------    I have reviewed the triage vital signs and the nursing notes.   HISTORY  Chief Complaint Chest Pain    HPI Joshua Larsen is a 65 y.o. male who presents to ER for chest pain. Patient states chest pain beginning about 1 AM. He has had a cough that has been very persistent and dry for the last 2 days. Nothing makes his symptoms better or worse. He states pain is near his left shoulder blade, he had these previous symptoms with an MI. Last visit here he arrested during heart catheter and ended up with 3 stents placed. He denies any other injuries or complaints.   Past Medical History  Diagnosis Date  . Hypertensive heart disease   . CAD (coronary artery disease)     a. 08/2000 s/p CABG x 5 (LIMA->LAD, VG->Diag, VG->dRCA->RPL, VG->OM); b. 07/2014 PCI VG->OM, VG->Diag 100, VG->PDA->RPL 100;  c. 08/2014 Cath: patent stent in VG->OM1->Med Rx; d. 10/2015 NSTEMI/VF arrest/CG Shock.  Marland Kitchen GERD (gastroesophageal reflux disease)   . CKD (chronic kidney disease), stage III   . Sleep apnea     a. On CPAP.  Marland Kitchen Chronic stable angina (HCC)   . Diet-controlled diabetes mellitus [facility])     Patient Active Problem List   Diagnosis Date Noted  . Acute gout 11/15/2015  . Left knee pain 11/10/2015  . History of gout 11/10/2015  . Acute non-ST elevation myocardial infarction (NSTEMI) (HCC)   . Cardiogenic shock (HCC) 11/05/2015  . Hypertensive heart disease   . CAD (coronary artery disease)   . GERD (gastroesophageal reflux disease)   . Encounter for management of intra-aortic balloon pump   . Unstable angina (HCC) 11/04/2015  . Cerebral amyloid angiopathy 09/23/2015  . Dizziness and giddiness 06/20/2015  . Symptomatic sinus bradycardia 06/20/2015  . HTN (hypertension) 06/20/2015  . Diabetes (HCC)  06/20/2015  . CKD stage 3 secondary to diabetes (HCC) 06/20/2015  . OSA (obstructive sleep apnea) 06/20/2015  . Elevated troponin 06/18/2015  . Type 2 diabetes mellitus (HCC) 06/10/2015  . Alopecia 02/11/2015  . Adaptation reaction 02/11/2015  . Acute MI (HCC) 02/11/2015  . Arteriosclerosis of coronary artery 02/11/2015  . Clinical depression 02/11/2015  . Diabetes mellitus, type 2 (HCC) 02/11/2015  . Acid reflux 02/11/2015  . Gout 02/11/2015  . Hepatitis A 02/11/2015  . Asthma 02/11/2015  . HLD (hyperlipidemia) 02/11/2015  . BP (high blood pressure) 02/11/2015  . Cannot sleep 02/11/2015  . Arthritis, degenerative 02/11/2015  . Adiposity 02/11/2015  . Acute kidney failure (HCC) 02/11/2015  . Apnea, sleep 02/11/2015  . Calculus of kidney 12/25/2013  . CAD in native artery 12/20/2013    Past Surgical History  Procedure Laterality Date  . Cardiac catheterization      [place] [address]  . Back surgery    . Replacement total knee Bilateral     a. 01/2003 LTKA; b. 07/2007 RTKA  . Coronary artery bypass graft      2000  . Wrist surgery Right   . Cardiac catheterization N/A 11/05/2015    Procedure: Coronary/Graft Angiography;  Surgeon: Alwyn Pea, MD;  Location: [facility] INVASIVE CV LAB;  Service: Cardiovascular;  Laterality: N/A;  . Cardiac catheterization N/A 11/05/2015    Procedure: IABP Insertion;  Surgeon: Alwyn Pea, MD;  Location: [facility] INVASIVE CV LAB;  Service: Cardiovascular;  Laterality: N/A;  . Cardiac  catheterization N/A 11/09/2015    Procedure: Coronary Stent Intervention;  Surgeon: Peter M Swaziland, MD;  Location: [facility] INVASIVE CV LAB;  Service: Cardiovascular;  Laterality: N/A;    Allergies Amoxicillin; Codeine; and Tagamet  Social History Social History  Substance Use Topics  . Smoking status: Never Smoker   . Smokeless tobacco: Never Used  . Alcohol Use: No    Review of Systems Constitutional: Negative for fever. Eyes: Negative for visual changes. ENT:  Negative for sore throat. Cardiovascular: Positive for chest pain Respiratory: Negative for shortness of breath. Positive cough Gastrointestinal: Negative for abdominal pain, vomiting and diarrhea. Genitourinary: Negative for dysuria. Musculoskeletal: Positive for back pain Skin: Negative for rash. Neurological: Negative for headaches, focal weakness or numbness.  10-point ROS otherwise negative.  ____________________________________________   PHYSICAL EXAM:  VITAL SIGNS: ED Triage Vitals  Enc Vitals Group     BP 02/27/16 1343 134/57 mmHg     Pulse Rate 02/27/16 1343 84     Resp 02/27/16 1343 20     Temp 02/27/16 1343 98.4 F (36.9 C)     Temp Source 02/27/16 1343 Oral     SpO2 02/27/16 1343 97 %     Weight 02/27/16 1343 300 lb (136.079 kg)     Height 02/27/16 1343 5\' 9"  (1.753 m)     Head Cir --      Peak Flow --      Pain Score 02/27/16 1346 5     Pain Loc --      Pain Edu? --      Excl. in GC? --     Constitutional: Alert and oriented. Well appearing and in no distress. Eyes: Conjunctivae are normal. PERRL. Normal extraocular movements. ENT   Head: Normocephalic and atraumatic.   Nose: No congestion/rhinnorhea.   Mouth/Throat: Mucous membranes are moist.   Neck: No stridor. Cardiovascular: Normal rate, regular rhythm. No murmurs, rubs, or gallops. Respiratory: Normal respiratory effort without tachypnea nor retractions. Breath sounds are clear and equal bilaterally. No wheezes/rales/rhonchi. Gastrointestinal: Soft and nontender. Normal bowel sounds Musculoskeletal: Nontender with normal range of motion in all extremities. No lower extremity tenderness nor edema. Neurologic:  Normal speech and language. No gross focal neurologic deficits are appreciated.  Skin:  Skin is warm, dry and intact. No rash noted. Psychiatric: Mood and affect are normal. Speech and behavior are normal.  ____________________________________________  EKG: Interpreted by  me.Sinus rhythm with a rate of 83 bpm, normal PR interval, wide QRS, normal QT interval. Normal axis, left bundle branch block. Left bundle branch block is old  ____________________________________________  ED COURSE:  Pertinent labs & imaging results that were available during my care of the patient were reviewed by me and considered in my medical decision making (see chart for details). Patient presents to ER with chest pain similar to prior MI. I will check basic labs and discuss with cardiology. He is high risk. ____________________________________________    LABS (pertinent positives/negatives)  Labs Reviewed  CBC - Abnormal; Notable for the following:    Hemoglobin 11.8 (*)    HCT 34.9 (*)    MCV 77.7 (*)    RDW 17.2 (*)    All other components within normal limits  COMPREHENSIVE METABOLIC PANEL - Abnormal; Notable for the following:    Glucose, Bld 195 (*)    BUN 32 (*)    Creatinine, Ser 1.83 (*)    GFR calc non Af Amer 37 (*)    GFR calc Af Amer 43 (*)  All other components within normal limits  TROPONIN I  TROPONIN I    RADIOLOGY  Chest x-ray  IMPRESSION: No active cardiopulmonary disease.  ____________________________________________  FINAL ASSESSMENT AND PLAN  Chest pain, Cough  Plan: Patient with labs and imaging as dictated above. Troponin is negative 2 here. Patient's labs appear to be at his baseline. I have discussed with Dr. Lady Gary who recommends outpatient follow-up. Patient is agreeable to this, we will increase his Imdur from 60 mg to 90 mg. Chest pain is likely from cough. Advised he return for worsening or worrisome symptoms.   Emily Filbert, MD   Note: This dictation was prepared with Dragon dictation. Any transcriptional errors that result from this process are unintentional   Emily Filbert, MD 02/27/16 [phone]

## 2016-05-30 ENCOUNTER — Other Ambulatory Visit: Payer: Self-pay

## 2016-05-30 MED ORDER — FENOFIBRATE 160 MG PO TABS: 160.0000 mg | ORAL_TABLET | Freq: Every day | ORAL | 12 refills | Status: DC

## 2016-05-30 MED ORDER — FENOFIBRATE 160 MG PO TABS: 160.0000 mg | ORAL_TABLET | Freq: Every day | ORAL | 12 refills | Status: AC

## 2016-05-30 NOTE — Telephone Encounter (Signed)
Refill request for [employer]

## 2016-06-16 ENCOUNTER — Other Ambulatory Visit: Payer: Self-pay | Admitting: Otolaryngology

## 2016-06-16 DIAGNOSIS — E041 Nontoxic single thyroid nodule: Secondary | ICD-10-CM

## 2016-06-20 ENCOUNTER — Other Ambulatory Visit: Payer: Self-pay

## 2016-06-20 MED ORDER — GLUCOSE BLOOD VI STRP: ORAL_STRIP | 12 refills | Status: AC

## 2016-06-27 ENCOUNTER — Ambulatory Visit
Admission: RE | Admit: 2016-06-27 | Discharge: 2016-06-27 | Disposition: A | Discharge status: Home / Self Care | Payer: Medicare Other | Source: Ambulatory Visit | Attending: Otolaryngology | Admitting: Otolaryngology

## 2016-06-27 DIAGNOSIS — E042 Nontoxic multinodular goiter: Secondary | ICD-10-CM | POA: Insufficient documentation

## 2016-06-27 DIAGNOSIS — E041 Nontoxic single thyroid nodule: Secondary | ICD-10-CM

## 2016-06-29 ENCOUNTER — Other Ambulatory Visit: Payer: Self-pay | Admitting: Otolaryngology

## 2016-06-29 DIAGNOSIS — R059 Cough, unspecified: Secondary | ICD-10-CM

## 2016-06-29 DIAGNOSIS — R05 Cough: Secondary | ICD-10-CM

## 2016-07-06 ENCOUNTER — Ambulatory Visit
Admission: RE | Admit: 2016-07-06 | Discharge: 2016-07-06 | Disposition: A | Discharge status: Home / Self Care | Payer: Medicare Other | Source: Ambulatory Visit | Attending: Otolaryngology | Admitting: Otolaryngology

## 2016-07-06 DIAGNOSIS — I700 Atherosclerosis of aorta: Secondary | ICD-10-CM | POA: Insufficient documentation

## 2016-07-06 DIAGNOSIS — R918 Other nonspecific abnormal finding of lung field: Secondary | ICD-10-CM | POA: Diagnosis not present

## 2016-07-06 DIAGNOSIS — R059 Cough, unspecified: Secondary | ICD-10-CM

## 2016-07-06 DIAGNOSIS — R05 Cough: Secondary | ICD-10-CM | POA: Diagnosis not present

## 2016-07-13 ENCOUNTER — Encounter: Payer: Self-pay | Admitting: Pulmonary Disease

## 2016-07-26 ENCOUNTER — Encounter: Payer: Self-pay | Admitting: Pulmonary Disease

## 2016-07-26 ENCOUNTER — Ambulatory Visit (INDEPENDENT_AMBULATORY_CARE_PROVIDER_SITE_OTHER): Payer: Medicare Other | Admitting: Pulmonary Disease

## 2016-07-26 VITALS — BP 136/74 | HR 80 | Ht 69.0 in | Wt 301.0 lb

## 2016-07-26 DIAGNOSIS — R059 Cough, unspecified: Secondary | ICD-10-CM

## 2016-07-26 DIAGNOSIS — R0609 Other forms of dyspnea: Secondary | ICD-10-CM

## 2016-07-26 DIAGNOSIS — R05 Cough: Secondary | ICD-10-CM

## 2016-07-26 DIAGNOSIS — R918 Other nonspecific abnormal finding of lung field: Secondary | ICD-10-CM

## 2016-07-26 DIAGNOSIS — J45909 Unspecified asthma, uncomplicated: Secondary | ICD-10-CM | POA: Diagnosis not present

## 2016-07-26 MED ORDER — ALBUTEROL SULFATE HFA 108 (90 BASE) MCG/ACT IN AERS: 1.0000 | INHALATION_SPRAY | RESPIRATORY_TRACT | 2 refills | Status: DC | PRN

## 2016-07-26 MED ORDER — FLUTICASONE FUROATE-VILANTEROL 100-25 MCG/INH IN AEPB: 1.0000 | INHALATION_SPRAY | Freq: Every day | RESPIRATORY_TRACT | 0 refills | Status: AC

## 2016-07-26 MED ORDER — FLUTICASONE FUROATE-VILANTEROL 100-25 MCG/INH IN AEPB: 1.0000 | INHALATION_SPRAY | Freq: Every day | RESPIRATORY_TRACT | 10 refills | Status: DC

## 2016-07-26 NOTE — Progress Notes (Signed)
Patient ID: Joshua Larsen, male   DOB: 1950/10/12, 65 y.o.   MRN: [id number] Patient seen in the office today and instructed on use of BREO ELLIPTA.  Patient expressed understanding and demonstrated technique.

## 2016-07-26 NOTE — Patient Instructions (Addendum)
I think this is probably asthma or something like it Spirometry (lung function test) today Breo inhaler - one inhalation every day Albuterol inhaler - 1 to 2 inhalations up to every 4-6 hrs as needed for cough, shortness of breath or chest tightness Follow up in 4-6 weeks

## 2016-07-26 NOTE — Progress Notes (Signed)
PULMONARY CONSULT NOTE  Requesting MD/Service: Vaught Date of initial consultation: 07/26/16 Reason for consultation: Chronic cough  PT PROFILE: 65 y.o. M never smoker referred from ENT for evaluation of chronic paroxysmal cough of several months duration  DATA: Echocardiogram 11/04/15:  LVEF 30-35%, moderate MR CT chest 07/06/16: Multiple small pulmonary nodules are identified in both lungs. Unchanged from previous exam and likely represent a benign process Spirometry 07/26/16: No obstruction (FEV1/FVC 74%), probable moderate restriction (FVC 55% pred)  HPI:  As above. He first noted cough after he suffered an MI in March 2017 which was complicated by brief VF arrest. He was not intubated. He underwent stents to his RCA (performed @ [facility] by Dr Swaziland). His cough comes in vigorous paroxysms several times per day and is productive of scant thick white mucus. He is particularly sensitive to changes in temperature and especially to cold air. He notes a "crackling" sensation in his throat. The cough occurs day and night and often prevents him from sleeping. Notably, he has a history of OSA treated with CPAP. He also notes mild DOE which varies little from day to day. Dr Andee Poles performed a CT chest which revealed very small bilateral pulmonary nodules which have been noted previously.    Past Medical History:  Diagnosis Date  . CAD (coronary artery disease)    a. 08/2000 s/p CABG x 5 (LIMA->LAD, VG->Diag, VG->dRCA->RPL, VG->OM); b. 07/2014 PCI VG->OM, VG->Diag 100, VG->PDA->RPL 100;  c. 08/2014 Cath: patent stent in VG->OM1->Med Rx; d. 10/2015 NSTEMI/VF arrest/CG Shock.  . Chronic stable angina (HCC)   . CKD (chronic kidney disease), stage III   . Diet-controlled diabetes mellitus (HCC)   . GERD (gastroesophageal reflux disease)   . Heart attack   . Hypertensive heart disease   . Sleep apnea    a. On CPAP.    Past Surgical History:  Procedure Laterality Date  . BACK SURGERY    . CARDIAC  CATHETERIZATION     [place] [facility]  . CARDIAC CATHETERIZATION N/A 11/05/2015   Procedure: Coronary/Graft Angiography;  Surgeon: Alwyn Pea, MD;  Location: [facility] INVASIVE CV LAB;  Service: Cardiovascular;  Laterality: N/A;  . CARDIAC CATHETERIZATION N/A 11/05/2015   Procedure: IABP Insertion;  Surgeon: Alwyn Pea, MD;  Location: [facility] INVASIVE CV LAB;  Service: Cardiovascular;  Laterality: N/A;  . CARDIAC CATHETERIZATION N/A 11/09/2015   Procedure: Coronary Stent Intervention;  Surgeon: Peter M Swaziland, MD;  Location: [facility] INVASIVE CV LAB;  Service: Cardiovascular;  Laterality: N/A;  . CORONARY ARTERY BYPASS GRAFT     2000  . REPLACEMENT TOTAL KNEE Bilateral    a. 01/2003 LTKA; b. 07/2007 RTKA  . WRIST SURGERY Right     MEDICATIONS: I have reviewed all medications and confirmed regimen as documented  Social History   Social History  . Marital status: Divorced    Spouse name: divorced  . Number of children: 1  . Years of education: 81   Occupational History  . retired    Social History Main Topics  . Smoking status: Never Smoker  . Smokeless tobacco: Never Used  . Alcohol use No  . Drug use: No  . Sexual activity: No   Other Topics Concern  . Not on file   Social History Narrative   Lives at home. He is retired now. 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    Family History  Problem Relation Age of Onset  . Liver cancer Mother   . CAD Mother   .  Arthritis Mother   . Heart attack Father   . CVA Father   . CAD Father   . Dementia Sister     ROS: No fever, myalgias/arthralgias, unexplained weight loss or weight gain No new focal weakness or sensory deficits No otalgia, hearing loss, visual changes, nasal and sinus symptoms, mouth and throat problems No neck pain or adenopathy No abdominal pain, N/V/D, diarrhea, change in bowel pattern No dysuria, change in urinary pattern   Vitals:   07/26/16 1312  BP: 136/74  Pulse: 80  SpO2: 99%  Weight: (!) 301 lb  (136.5 kg)  Height: 5\' 9"  (1.753 m)     EXAM:  Gen: Obese, No overt respiratory distress HEENT: NCAT, sclera white, oropharynx normal, nares patent and nasal mucosa normal. Tympanic canals and membranes normal Neck: Supple without LAN, thyromegaly, JVD Lungs: breath sounds: Full, percussion: normal, No wheezes or other adventitious sounds Cardiovascular: RRR, no murmurs noted Abdomen: Soft, nontender, normal BS Ext: without clubbing, cyanosis, edema Neuro: CNs grossly intact, motor and sensory intact Skin: Limited exam, no lesions noted  DATA:   BMP Latest Ref Rng & Units 02/27/2016 11/14/2015 11/12/2015  Glucose 65 - 99 mg/dL 161(W195(H) 960(A213(H) 540(J128(H)  BUN 6 - 20 mg/dL 81(X32(H) 91(Y42(H) 78(G37(H)  Creatinine 0.61 - 1.24 mg/dL 9.56(O1.83(H) 1.30(Q1.53(H) 6.57(Q1.66(H)  BUN/Creat Ratio 10 - 22 - - -  Sodium 135 - 145 mmol/L 140 134(L) 133(L)  Potassium 3.5 - 5.1 mmol/L 4.0 4.9 4.3  Chloride 101 - 111 mmol/L 108 103 100(L)  CO2 22 - 32 mmol/L 23 22 23   Calcium 8.9 - 10.3 mg/dL 9.1 8.9 4.6(N8.6(L)    CBC Latest Ref Rng & Units 02/27/2016 11/15/2015 11/14/2015  WBC 3.8 - 10.6 K/uL 8.3 13.3(H) 15.8(H)  Hemoglobin 13.0 - 18.0 g/dL 11.8(L) 10.5(L) 11.2(L)  Hematocrit 40.0 - 52.0 % 34.9(L) 33.0(L) 34.8(L)  Platelets 150 - 440 K/uL 168 258 254    CXR (02/27/16): NACPD    IMPRESSION:     ICD-9-CM ICD-10-CM   1. Cough 786.2 R05 Spirometry with Graph     Spirometry with Graph  2. DOE (dyspnea on exertion) 786.09 R06.09   3. Multiple pulmonary nodules 793.19 R91.8   4. Uncomplicated asthma, unspecified asthma severity, unspecified whether persistent 493.90 J45.909 Spirometry with Graph  1) Chronic paroxysmal cough of unclear etiology. It is hard to know what to make of the onset around the time of his AMI in March. It does not appear that he was ever on an ACEI. He is on an ARB presently but these are not generally associated with cough. The association with changes in ambient temperature and cold exposure suggest possible  airway hyperreactivity/asthma.  2) Multiple pulmonary nodules - benign based on stability > 2 yrs. These are unrelated to cough 3) Exertional dyspnea - probably multifactorial including cardiomyopathy, obesity and possible airways disease   PLAN:  1) Breo 100 inhaler - one inhalation daily 2) PRN albuterol 3) Continue Pantoprazole 4) ROV 4-6 weeks   Billy Fischeravid Simonds, MD PCCM service Mobile [phone] Pager [phone] 07/26/2016

## 2016-08-25 ENCOUNTER — Ambulatory Visit: Payer: Medicare Other | Admitting: Pulmonary Disease

## 2016-10-14 ENCOUNTER — Ambulatory Visit (INDEPENDENT_AMBULATORY_CARE_PROVIDER_SITE_OTHER): Payer: Medicare Other | Admitting: Family Medicine

## 2016-10-14 ENCOUNTER — Encounter: Payer: Self-pay | Admitting: Family Medicine

## 2016-10-14 VITALS — BP 112/60 | HR 90 | Temp 98.6°F | Resp 17 | Wt 305.8 lb

## 2016-10-14 DIAGNOSIS — J0100 Acute maxillary sinusitis, unspecified: Secondary | ICD-10-CM

## 2016-10-14 MED ORDER — DOXYCYCLINE HYCLATE 100 MG PO TABS: 100.0000 mg | ORAL_TABLET | Freq: Two times a day (BID) | ORAL | 0 refills | Status: DC

## 2016-10-14 NOTE — Patient Instructions (Signed)
Discussed use of Mucinex D for congestion and Delsym for cough. 

## 2016-10-14 NOTE — Progress Notes (Signed)
Subjective:     Patient ID: Joshua Larsen, male   DOB: [birth year], 66 y.o.   MRN: [id number]  HPI  Chief Complaint  Patient presents with  . Cough    Patient comes in offie today with complaints of cough and congestion for the past week. Pateitne reports that she has sinus pain on right side of head, cough productive of yellow sputum, fatigue, shortness of breath and fever high of 100.1  Continues to have a chronic cough followed by pulmonary. Has been taking otc cold medication and cough drops with little improvement. Does not take the flu shot.   Review of Systems     Objective:   Physical Exam  Constitutional: He appears well-developed and well-nourished. No distress.  Ears: T.M's intact without inflammation Sinuses: mild right maxillary sinus tenderness Throat: no tonsillar enlargement or exudate Neck: no cervical adenopathy Lungs: clear     Assessment:    1. Acute non-recurrent maxillary sinusitis - doxycycline (VIBRA-TABS) 100 MG tablet; Take 1 tablet (100 mg total) by mouth 2 (two) times daily.  Dispense: 20 tablet; Refill: 0    Plan:    Discussed use of Mucinex D and Delsym.

## 2016-11-07 ENCOUNTER — Ambulatory Visit: Payer: Medicare Other | Admitting: Pulmonary Disease

## 2016-11-08 ENCOUNTER — Encounter: Payer: Self-pay | Admitting: Pulmonary Disease

## 2016-11-08 ENCOUNTER — Ambulatory Visit (INDEPENDENT_AMBULATORY_CARE_PROVIDER_SITE_OTHER): Payer: Medicare Other | Admitting: Pulmonary Disease

## 2016-11-08 VITALS — BP 128/64 | HR 98 | Wt 304.0 lb

## 2016-11-08 DIAGNOSIS — H7292 Unspecified perforation of tympanic membrane, left ear: Secondary | ICD-10-CM | POA: Diagnosis not present

## 2016-11-08 DIAGNOSIS — J310 Chronic rhinitis: Secondary | ICD-10-CM

## 2016-11-08 DIAGNOSIS — G4733 Obstructive sleep apnea (adult) (pediatric): Secondary | ICD-10-CM

## 2016-11-08 DIAGNOSIS — R059 Cough, unspecified: Secondary | ICD-10-CM

## 2016-11-08 DIAGNOSIS — R5383 Other fatigue: Secondary | ICD-10-CM

## 2016-11-08 DIAGNOSIS — R05 Cough: Secondary | ICD-10-CM

## 2016-11-08 NOTE — Progress Notes (Signed)
PULMONARY OFFICE FOLLOW UP NOTE  Requesting MD/Service: Vaught Date of initial consultation: 07/26/16 Reason for consultation: Chronic cough  PT PROFILE: 66 y.o. M never smoker referred from ENT for evaluation of chronic paroxysmal cough of several months duration. Initial impression was possible asthma. Initial plan: Breo, PRN albuterol, continue PPI  DATA: Echocardiogram 11/04/15:  LVEF 30-35%, moderate MR CT chest 07/06/16: Multiple small pulmonary nodules are identified in both lungs. Unchanged from previous exam and likely represent a benign process Spirometry 07/26/16: No obstruction (FEV1/FVC 74%), probable moderate restriction (FVC 55% pred)  SUBJ:  Routine re-evaluation. He missed a prior appointment due to severe gout flare. Cough is no better - still severe and lasting all day long. Rattling but minimally productive. Breo did not help and was costing too much so he stopped it. Albuterol did not help and he is not using it. Continues to have significant DOE. Denies CP, fever, purulent sputum, hemoptysis, LE edema and calf tenderness.  Also complains of profound fatigue and daytime hypersomnolence. He has history of OSA and is CPAP. He is unsure of his settings. He has been on the same settings for many years though he has gained more than 50# since diagnosis.   Also C/O popping sound in L ear   Vitals:   11/08/16 0844 11/08/16 0847  BP:  128/64  Pulse:  98  SpO2:  99%  Weight: (!) 304 lb (137.9 kg)   RA  EXAM:  Gen: Very obese, No overt respiratory distress HEENT: NCAT, sclera white, oropharynx normal, nares patent and nasal mucosa minimally erythematous. L TM appears ruptured with old dried blood in canal Neck: Supple without LAN, thyromegaly, JVD Lungs: breath sounds mildly decreased, no wheezes or other adventitious sounds Cardiovascular: RRR, no murmurs noted Abdomen: Soft, nontender, normal BS Ext: symmetric ankle edema Neuro: grossly normal  DATA:   BMP  Latest Ref Rng & Units 02/27/2016 11/14/2015 11/12/2015  Glucose 65 - 99 mg/dL 578(I) 696(E) 952(W)  BUN 6 - 20 mg/dL 41(L) 24(M) 01(U)  Creatinine 0.61 - 1.24 mg/dL 2.72(Z) 3.66(Y) 4.03(K)  BUN/Creat Ratio 10 - 22 - - -  Sodium 135 - 145 mmol/L 140 134(L) 133(L)  Potassium 3.5 - 5.1 mmol/L 4.0 4.9 4.3  Chloride 101 - 111 mmol/L 108 103 100(L)  CO2 22 - 32 mmol/L 23 22 23   Calcium 8.9 - 10.3 mg/dL 9.1 8.9 7.4(Q)    CBC Latest Ref Rng & Units 02/27/2016 11/15/2015 11/14/2015  WBC 3.8 - 10.6 K/uL 8.3 13.3(H) 15.8(H)  Hemoglobin 13.0 - 18.0 g/dL 11.8(L) 10.5(L) 11.2(L)  Hematocrit 40.0 - 52.0 % 34.9(L) 33.0(L) 34.8(L)  Platelets 150 - 440 K/uL 168 258 254    CXR: NNF  IMPRESSION:    1) Disabling cough - unclear etiology. He is on an ARB which rarely causes cough but I have no other clear etiology at the present time 2) Mild chronic rhinitis - he has been prescribed Nasonex in the past but is not using it 3) History of OSA with excessive daytime somnolence and fatigue despite CPAP 4) Perforated L TM of unclear etiology - denies cleaning ears with cotton swabs 5) Morbid obesity  6) multifactorial DOE - obesity, cardiomyopathy, deconditioning   PLAN:  1) For cough  Continue Protonix  Resume Nasonex  Stop irbesartan 2) OSA, hypersomnolence, fatigue - split night PSG ordered 3) Perforated L TM - refer back to Dr Andee Poles 4) Obesity - counseled in detail the relationship between his weight and his other medical problems. Discussed  weight loss strategies and encouraged a low CHO diet. Might consider bariatric surgery in future 5) F/U 6 weeks   Billy Fischer, MD PCCM service Mobile [phone] Pager [phone] 11/08/2016

## 2016-11-08 NOTE — Patient Instructions (Signed)
Cough - this is of unclear cause. It might be related to the BP medication irbesartan. You also have inflammation in your nasal passages (rhinitis) that might be contributing  Continue Protonix  Resume Nasonex  Stop irbesartan  Sleep apnea - I think this is not sufficiently treated  Repeat sleep study  Perforated L ear drum  Referral back to Dr Andee Poles for further evaluation  Obesity - this problem underlies many or most of your problems and needs to be addressed more aggressively if we are to get the results that you desire  Fatigue - this is due to multiple causes including sleep apnea, obesity

## 2016-11-22 ENCOUNTER — Telehealth: Payer: Self-pay | Admitting: Pulmonary Disease

## 2016-11-22 NOTE — Telephone Encounter (Signed)
Pt states he has not heard anything regarding his sleep study. Please call.

## 2016-11-22 NOTE — Telephone Encounter (Signed)
Pt was asking when we sent in the request to [facility] for his split night. Pt informed. States he will contact them since he hasn't heard from sleep med. Pt states he has trouble sleeping lying down in abed and states he will speak with sleep med about that. Pt was wondering if we need to just do a titration study to make sure his machine is at the correct settings. He states to also to let you know that he has stopped the Irbesartan and has started using the Dymista and this has helped. Pt asked to inform you of the above.

## 2016-11-23 NOTE — Telephone Encounter (Signed)
I'm not sure if there is a question in there for me to answer... I want him to get split night study, not just a titration study  Theodoro Grist

## 2016-11-23 NOTE — Telephone Encounter (Signed)
Pt informed we prefer the split night. Pt verbalized understanding. Nothing further needed.

## 2017-01-02 ENCOUNTER — Ambulatory Visit: Payer: Medicare Other | Admitting: Pulmonary Disease

## 2017-02-19 IMAGING — CR DG CHEST 1V PORT
1 series · 1 of 1 positions shown · non-contrast
Comparison: 11/05/2015

CLINICAL DATA: Cough.  Dyspnea.

EXAM:
PORTABLE CHEST 1 VIEW

[AP]
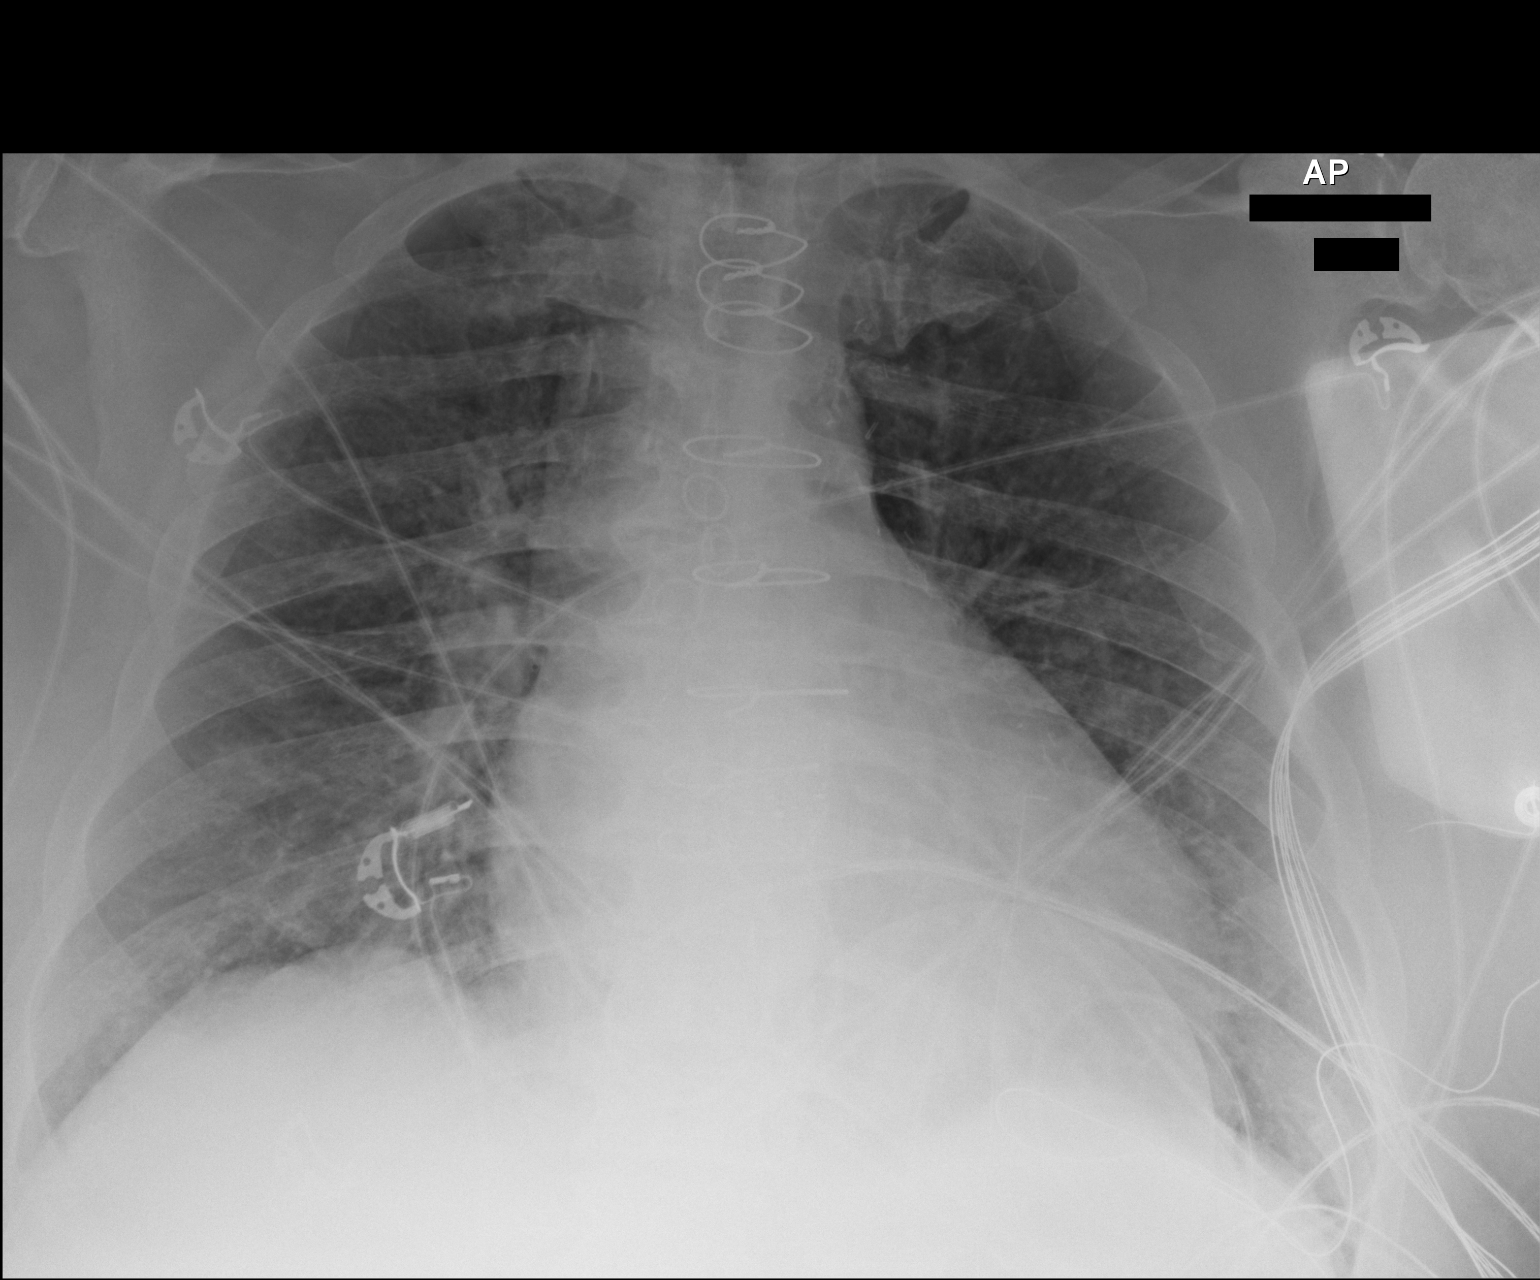

[1 of 1 positions shown; findings below may reference images not displayed]

FINDINGS: There is unchanged cardiomegaly. The lungs are clear. There is no
large effusion. Slight fullness of the pulmonary vasculature is
unchanged and may be chronic.
IMPRESSION: Unchanged cardiomegaly and slight vascular prominence. No interval
change from 11/05/2015.

## 2017-02-20 IMAGING — CR DG CHEST 1V PORT
2 series · 2 of 2 positions shown · non-contrast
Comparison: Chest radiograph from one day prior.

CLINICAL DATA: CHF

EXAM:
PORTABLE CHEST 1 VIEW

[AP (1 of 2)]
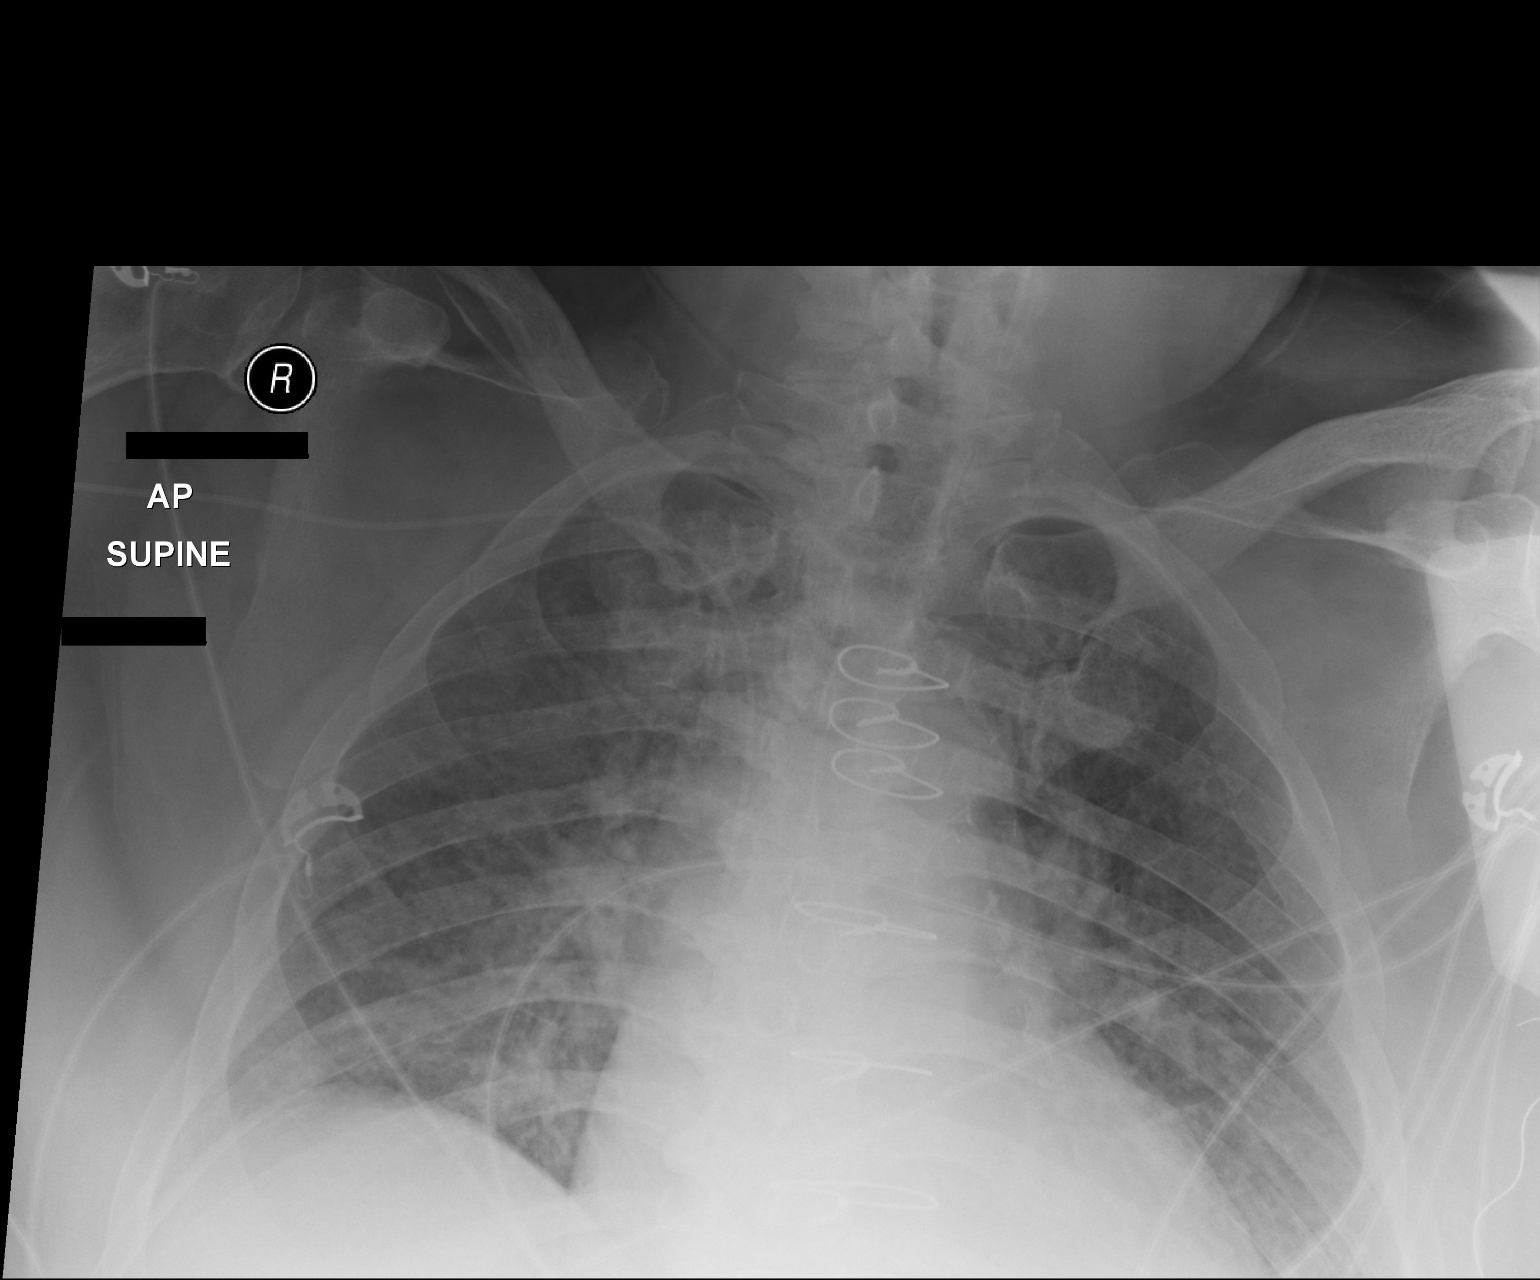

[AP (2 of 2)]
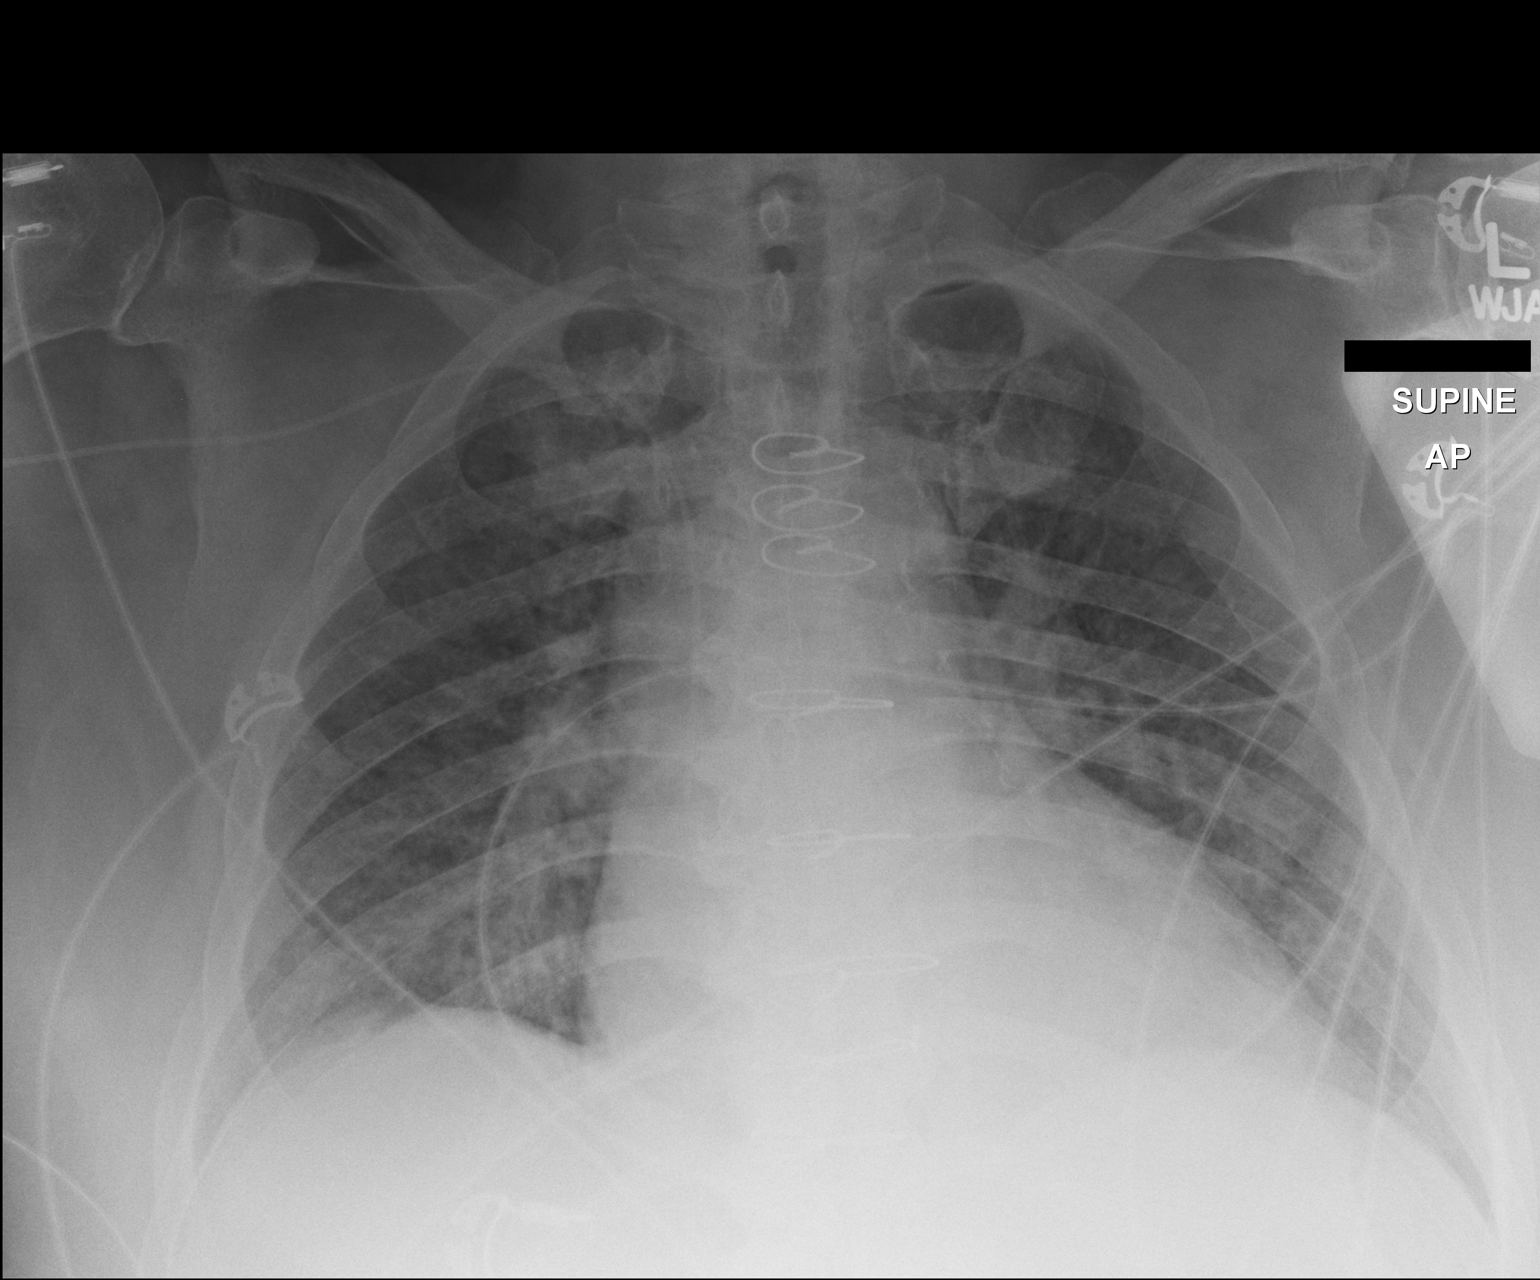

[2 of 2 positions shown; findings below may reference images not displayed]

FINDINGS: Sternotomy wires appear aligned and intact. Right PICC terminates at
the cavoatrial junction. Stable cardiomediastinal silhouette with
mild cardiomegaly. No pneumothorax. No pleural effusion. Moderate
pulmonary edema, stable slightly worsened.
IMPRESSION: Moderate congestive heart failure, stable to slightly worsened.

## 2017-02-23 IMAGING — DX DG KNEE 1-2V*L*
2 series · 2 of 2 positions shown · non-contrast
Comparison: None.

CLINICAL DATA: Pain

EXAM:
LEFT KNEE - 1-2 VIEW

[x knee ap left]
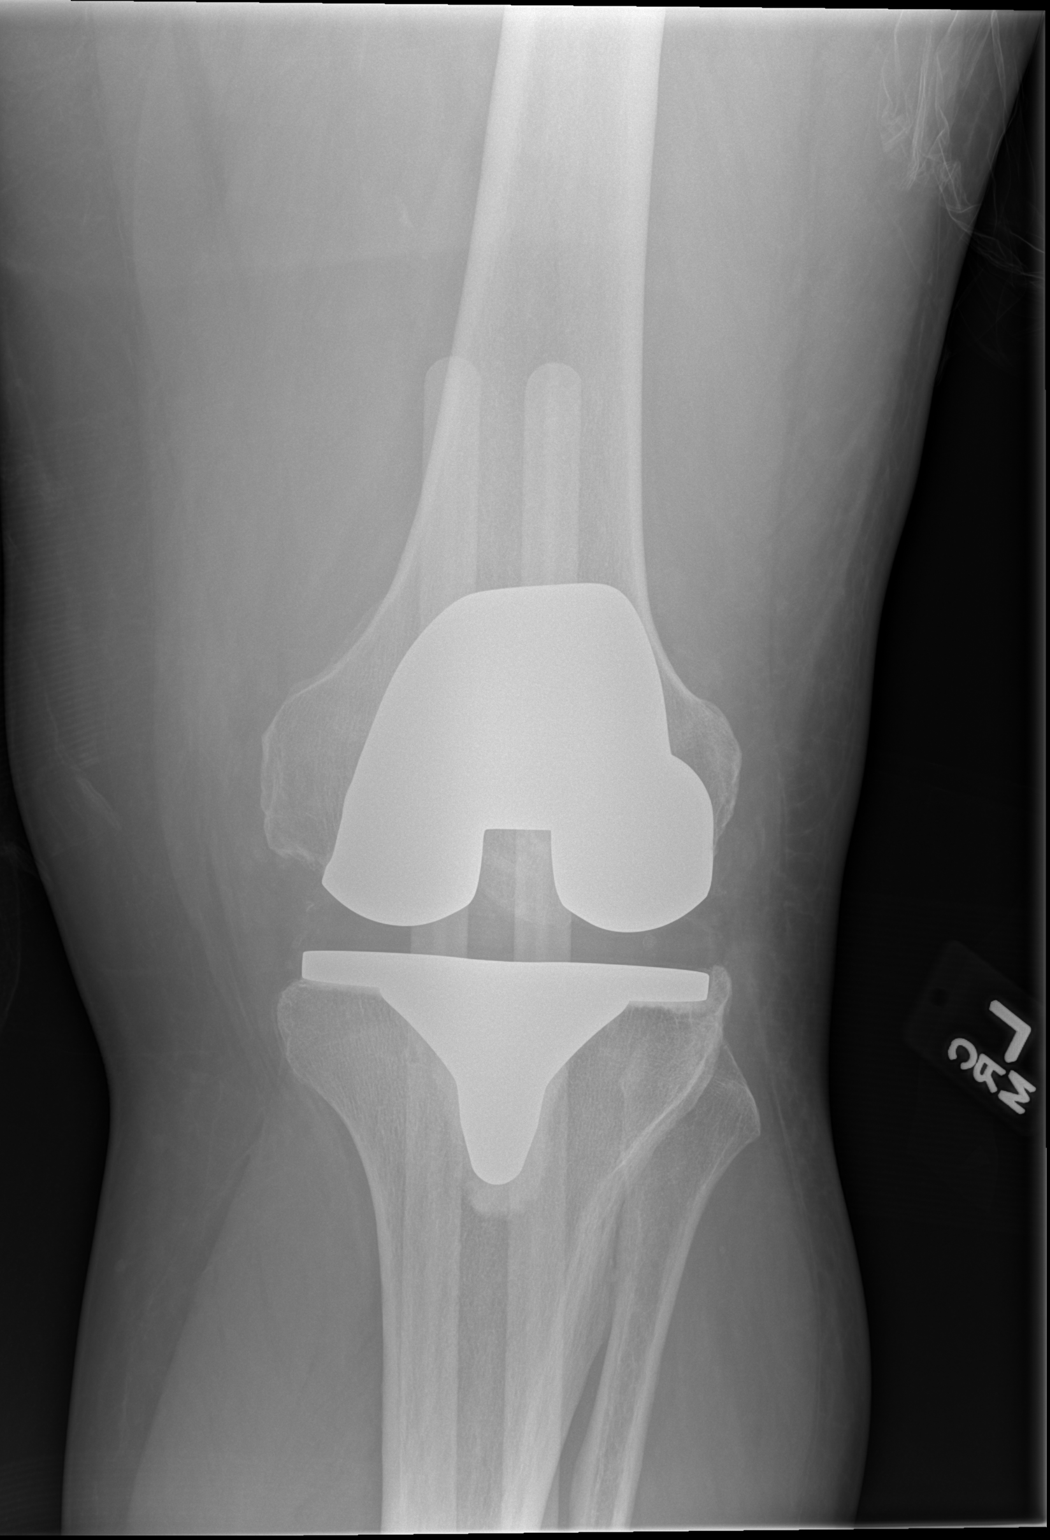

[x knee lat left]
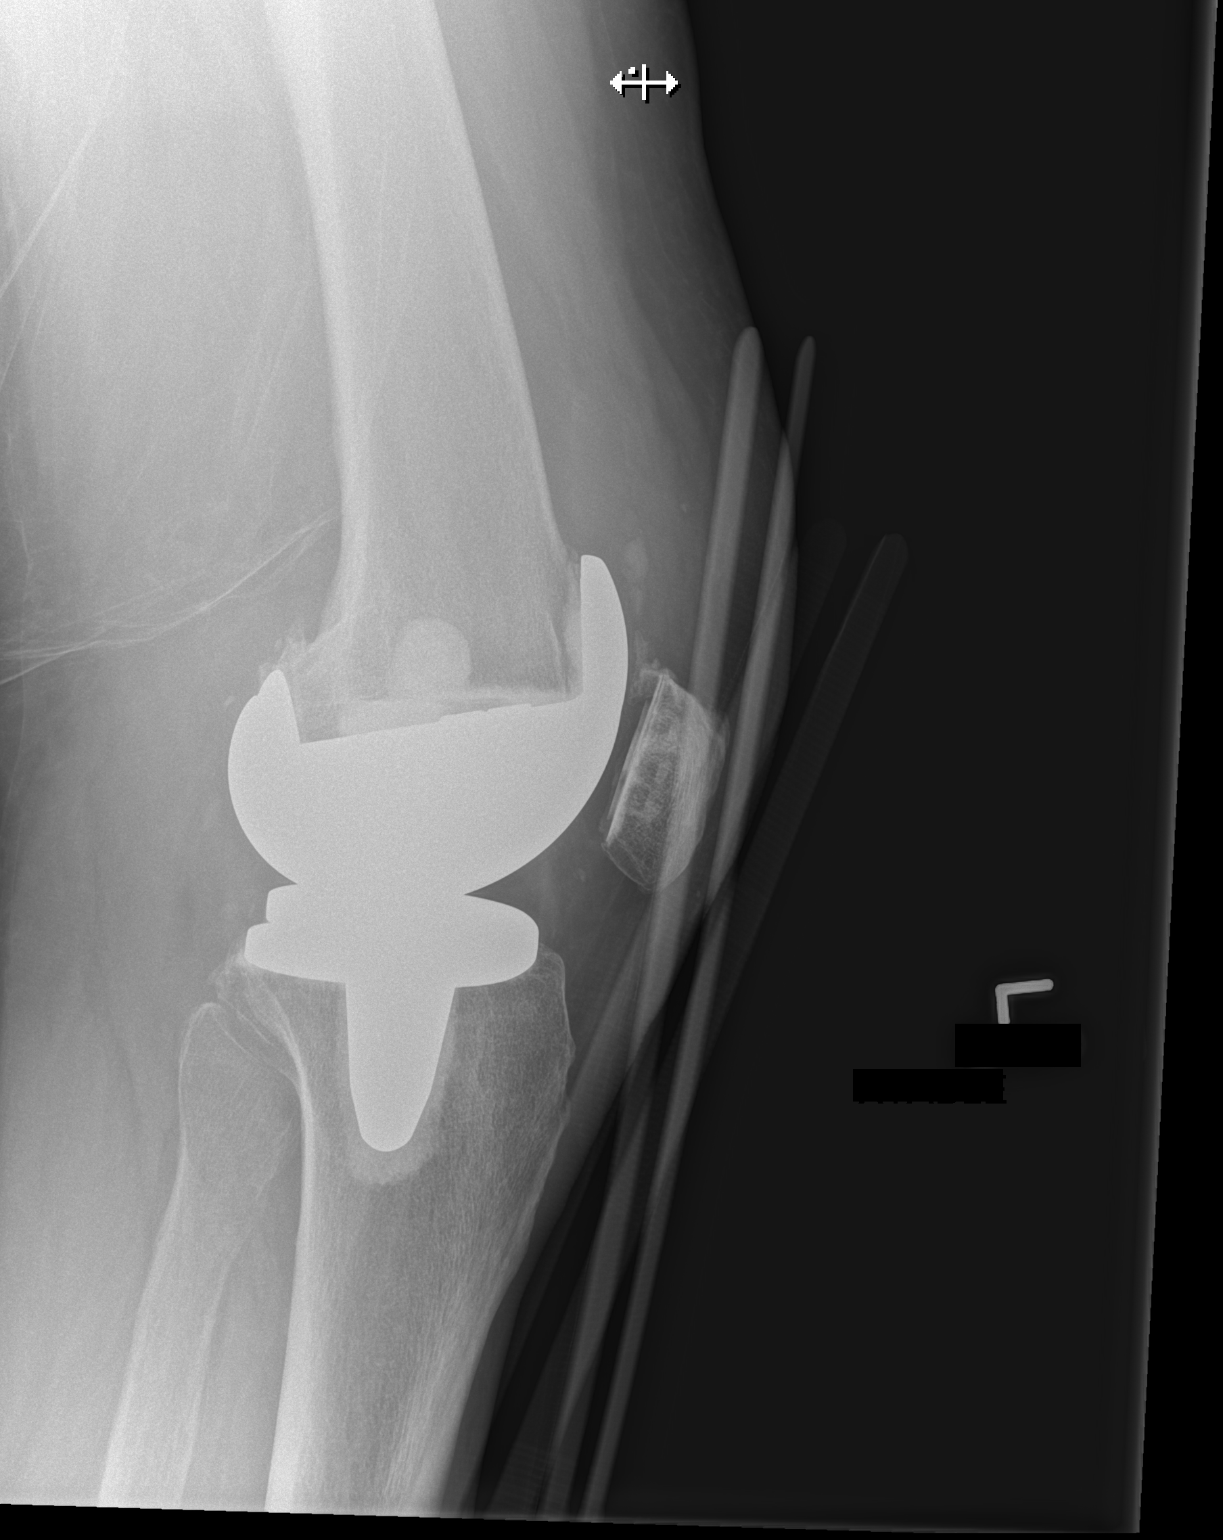

[2 of 2 positions shown; findings below may reference images not displayed]

FINDINGS: Frontal and lateral views were obtained. The patient is status post
total knee replacement with the femoral and tibial prosthetic
components appearing well-seated. No fracture or dislocation is
evident. There is a moderate joint effusion. There is no erosive
change or bony destruction.
IMPRESSION: Total knee prosthetic components appear well seated. Moderate joint
effusion is present. No bony destruction or erosion evident. No
fracture or dislocation evident.

## 2017-02-28 ENCOUNTER — Ambulatory Visit
Admission: RE | Admit: 2017-02-28 | Discharge: 2017-02-28 | Disposition: A | Discharge status: Home / Self Care | Payer: Medicare Other | Source: Ambulatory Visit | Attending: Family Medicine | Admitting: Family Medicine

## 2017-02-28 ENCOUNTER — Ambulatory Visit (INDEPENDENT_AMBULATORY_CARE_PROVIDER_SITE_OTHER): Payer: Medicare Other | Admitting: Family Medicine

## 2017-02-28 VITALS — BP 124/80 | HR 94 | Temp 99.8°F | Resp 16 | Wt 303.0 lb

## 2017-02-28 DIAGNOSIS — Z1159 Encounter for screening for other viral diseases: Secondary | ICD-10-CM | POA: Diagnosis not present

## 2017-02-28 DIAGNOSIS — M62838 Other muscle spasm: Secondary | ICD-10-CM

## 2017-02-28 DIAGNOSIS — E7849 Other hyperlipidemia: Secondary | ICD-10-CM

## 2017-02-28 DIAGNOSIS — E118 Type 2 diabetes mellitus with unspecified complications: Secondary | ICD-10-CM

## 2017-02-28 DIAGNOSIS — I10 Essential (primary) hypertension: Secondary | ICD-10-CM

## 2017-02-28 DIAGNOSIS — M25522 Pain in left elbow: Secondary | ICD-10-CM | POA: Diagnosis not present

## 2017-02-28 DIAGNOSIS — M1A0221 Idiopathic chronic gout, left elbow, with tophus (tophi): Secondary | ICD-10-CM

## 2017-02-28 DIAGNOSIS — G4733 Obstructive sleep apnea (adult) (pediatric): Secondary | ICD-10-CM | POA: Diagnosis not present

## 2017-02-28 DIAGNOSIS — S134XXA Sprain of ligaments of cervical spine, initial encounter: Secondary | ICD-10-CM

## 2017-02-28 DIAGNOSIS — I214 Non-ST elevation (NSTEMI) myocardial infarction: Secondary | ICD-10-CM | POA: Diagnosis not present

## 2017-02-28 DIAGNOSIS — E784 Other hyperlipidemia: Secondary | ICD-10-CM

## 2017-02-28 MED ORDER — HYDROCODONE-ACETAMINOPHEN 5-325 MG PO TABS: 1.0000 | ORAL_TABLET | Freq: Four times a day (QID) | ORAL | 0 refills | Status: AC | PRN

## 2017-02-28 MED ORDER — PREDNISONE 5 MG PO TABS: 5.0000 mg | ORAL_TABLET | Freq: Every day | ORAL | 0 refills | Status: DC

## 2017-02-28 MED ORDER — CYCLOBENZAPRINE HCL 10 MG PO TABS: 10.0000 mg | ORAL_TABLET | Freq: Three times a day (TID) | ORAL | 3 refills | Status: AC | PRN

## 2017-02-28 NOTE — Progress Notes (Signed)
Joshua Larsen  MRN: [id number] DOB: Feb 04, 1951  Subjective:  HPI  Patient is here due to neck pain, left arm pain. Patient fell on Saturday July 7th on concrete, did not his his face or head. He hit the edge of the concrete and fell forward and favored his left side since right arm only has one joint and he did not want to get a fracture. He did hurt his left arm that he was having trouble with already with pain and swelling. The next day started to have pain top of his head down to the right side of the neck, back of the neck and shoulder area. It hurts to move his head, hurts to talk, hurts to open his mouth. He took some Hydrocodone every 4 hours yesterday that he had left over from a while ago to try and get some rest.  Pt says he did not strike his head but he seems to have strained his neck in the fall. He has pain with extending his neck and with opening his mouth wide. BP Readings from Last 3 Encounters:  02/28/17 124/80  11/08/16 128/64  10/14/16 112/60   Patient Active Problem List   Diagnosis Date Noted  . Acute gout 11/15/2015  . Left knee pain 11/10/2015  . History of gout 11/10/2015  . Acute non-ST elevation myocardial infarction (NSTEMI) (HCC)   . Cardiogenic shock (HCC) 11/05/2015  . Hypertensive heart disease   . CAD (coronary artery disease)   . GERD (gastroesophageal reflux disease)   . Encounter for management of intra-aortic balloon pump   . Unstable angina (HCC) 11/04/2015  . Cerebral amyloid angiopathy (HCC) 09/23/2015  . Dizziness and giddiness 06/20/2015  . Symptomatic sinus bradycardia 06/20/2015  . HTN (hypertension) 06/20/2015  . Diabetes (HCC) 06/20/2015  . CKD stage 3 secondary to diabetes (HCC) 06/20/2015  . OSA (obstructive sleep apnea) 06/20/2015  . Elevated troponin 06/18/2015  . Type 2 diabetes mellitus (HCC) 06/10/2015  . Alopecia 02/11/2015  . Adaptation reaction 02/11/2015  . Acute MI (HCC) 02/11/2015  . Arteriosclerosis of coronary  artery 02/11/2015  . Clinical depression 02/11/2015  . Diabetes mellitus, type 2 (HCC) 02/11/2015  . Acid reflux 02/11/2015  . Gout 02/11/2015  . Hepatitis A 02/11/2015  . Asthma 02/11/2015  . HLD (hyperlipidemia) 02/11/2015  . BP (high blood pressure) 02/11/2015  . Cannot sleep 02/11/2015  . Arthritis, degenerative 02/11/2015  . Adiposity 02/11/2015  . Acute kidney failure (HCC) 02/11/2015  . Apnea, sleep 02/11/2015  . Calculus of kidney 12/25/2013  . CAD in native artery 12/20/2013    Past Medical History:  Diagnosis Date  . CAD (coronary artery disease)    a. 08/2000 s/p CABG x 5 (LIMA->LAD, VG->Diag, VG->dRCA->RPL, VG->OM); b. 07/2014 PCI VG->OM, VG->Diag 100, VG->PDA->RPL 100;  c. 08/2014 Cath: patent stent in VG->OM1->Med Rx; d. 10/2015 NSTEMI/VF arrest/CG Shock.  . Chronic stable angina (HCC)   . CKD (chronic kidney disease), stage III   . Diet-controlled diabetes mellitus (HCC)   . GERD (gastroesophageal reflux disease)   . Heart attack   . Hypertensive heart disease   . Sleep apnea    a. On CPAP.    Social History   Social History  . Marital status: Divorced    Spouse name: divorced  . Number of children: 1  . Years of education: 82   Occupational History  . retired    Social History Main Topics  . Smoking status: Never Smoker  . Smokeless tobacco: Never  Used  . Alcohol use No  . Drug use: No  . Sexual activity: No   Other Topics Concern  . Not on file   Social History Narrative   Lives at home. He is retired now. 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    Outpatient Encounter Prescriptions as of 02/28/2017  Medication Sig  . allopurinol (ZYLOPRIM) 100 MG tablet Take 1 tablet (100 mg total) by mouth daily. Please call PCP for further refill.  Marland Kitchen aspirin EC 81 MG tablet Take 81 mg by mouth daily.  Marland Kitchen atorvastatin (LIPITOR) 80 MG tablet Take 1 tablet (80 mg total) by mouth every evening.  . carvedilol (COREG) 3.125 MG tablet Take 1 tablet (3.125 mg  total) by mouth 2 (two) times daily with a meal.  . fenofibrate 160 MG tablet Take 1 tablet (160 mg total) by mouth daily.  . furosemide (LASIX) 40 MG tablet Take 1 tablet by mouth daily.  Marland Kitchen glucose blood (FREESTYLE TEST STRIPS) test strip Check sugar once daily, DX E11.9  . isosorbide mononitrate (IMDUR) 30 MG 24 hr tablet Take 3 tablets (90 mg total) by mouth daily.  . pantoprazole (PROTONIX) 40 MG tablet Take 1 tablet (40 mg total) by mouth 2 (two) times daily.  . ticagrelor (BRILINTA) 90 MG TABS tablet Take 1 tablet (90 mg total) by mouth 2 (two) times daily.  . colchicine 0.6 MG tablet Take 1 tablet (0.6 mg total) by mouth 2 (two) times daily. Please call PCP for further refill. Unable to prior autho approval today due to weekend the line was closed (attempted). Office will try later, other wise call PCP. (Patient not taking: Reported on 02/28/2017)  . nitroGLYCERIN (NITROSTAT) 0.4 MG SL tablet Place 1 tablet (0.4 mg total) under the tongue every 5 (five) minutes x 3 doses as needed for chest pain. (Patient not taking: Reported on 02/28/2017)  . [DISCONTINUED] irbesartan (AVAPRO) 75 MG tablet Take 0.5 tablets (37.5 mg total) by mouth daily.   No facility-administered encounter medications on file as of 02/28/2017.     Allergies  Allergen Reactions  . Amoxicillin   . Codeine     GI side effects  . Tagamet [Cimetidine] Other (See Comments)    Causes changes in mental status Causes changes in mental status    Review of Systems  Constitutional: Positive for malaise/fatigue.  Eyes: Negative.   Respiratory: Negative.   Cardiovascular: Negative.   Gastrointestinal: Negative.   Musculoskeletal: Positive for falls, joint pain, myalgias and neck pain.  Skin: Negative.   Neurological: Positive for weakness and headaches.  Endo/Heme/Allergies: Negative.   Psychiatric/Behavioral: Negative.     Objective:  BP 124/80   Pulse 94   Temp 99.8 F (37.7 C)   Resp 16   Wt (!) 303 lb (137.4 kg)    BMI 44.75 kg/m   Physical Exam  Constitutional: He is oriented to person, place, and time and well-developed, well-nourished, and in no distress.  HENT:  Head: Normocephalic and atraumatic.  Right Ear: External ear normal.  Left Ear: External ear normal.  Nose: Nose normal.  Mouth/Throat: Oropharynx is clear and moist.  No TMJ tenderness.  Eyes: Conjunctivae are normal. No scleral icterus.  Cardiovascular: Normal rate, regular rhythm, normal heart sounds and intact distal pulses.  Exam reveals no gallop.   No murmur heard. Pulmonary/Chest: Effort normal and breath sounds normal. No respiratory distress. He has no wheezes.  Abdominal: Soft.  Musculoskeletal: He exhibits edema (trace).  Neurological: He is alert  and oriented to person, place, and time.  Skin: Skin is warm and dry.  Psychiatric: Mood, memory, affect and judgment normal.   Assessment and Plan :  1. Acute whiplash injury, initial encounter Treat with Flexeril and Prednisone, re check in 1 week. - cyclobenzaprine (FLEXERIL) 10 MG tablet; Take 1 tablet (10 mg total) by mouth 3 (three) times daily as needed for muscle spasms.  Dispense: 90 tablet; Refill: 3 - predniSONE (DELTASONE) 5 MG tablet; Take 1 tablet (5 mg total) by mouth daily with breakfast.  Dispense: 7 tablet; Refill: 0  2. Muscle spasms of neck - cyclobenzaprine (FLEXERIL) 10 MG tablet; Take 1 tablet (10 mg total) by mouth 3 (three) times daily as needed for muscle spasms.  Dispense: 90 tablet; Refill: 3 - predniSONE (DELTASONE) 5 MG tablet; Take 1 tablet (5 mg total) by mouth daily with breakfast.  Dispense: 7 tablet; Refill: 0  3. Acute non-ST elevation myocardial infarction (NSTEMI) (HCC) TSH  4. Type 2 diabetes mellitus with complication, without long-term current use of insulin (HCC) Check lab work. - HgB A1c  5. 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 Other hyperlipidemia -  Comprehensive metabolic panel - Lipid Panel With LDL/HDL Ratio - TSH  7. Essential hypertension - CBC with Differential/Platelet - Comprehensive metabolic panel - TSH  8. Left elbow pain Get xray done. Re check in 1 week. - DG Elbow Complete Left; Future  9. Need for hepatitis C screening test - Hepatitis C Antibody 10.Morbid Obesity 11. Noncompliance with follow up  HPI, Exam and A&P transcribed by Samara Deist, RMA under direction and in the presence of Julieanne Manson, MD. I have done the exam and reviewed the chart and it is accurate to the best of my knowledge. Dentist has been used and  any errors in dictation or transcription are unintentional. Julieanne Manson M.D. [facility]

## 2017-03-02 ENCOUNTER — Telehealth: Payer: Self-pay

## 2017-03-02 ENCOUNTER — Ambulatory Visit
Admission: RE | Admit: 2017-03-02 | Discharge: 2017-03-02 | Disposition: A | Discharge status: Home / Self Care | Payer: Medicare Other | Source: Ambulatory Visit | Attending: Family Medicine | Admitting: Family Medicine

## 2017-03-02 ENCOUNTER — Other Ambulatory Visit: Payer: Self-pay | Admitting: Family Medicine

## 2017-03-02 DIAGNOSIS — M542 Cervicalgia: Secondary | ICD-10-CM | POA: Insufficient documentation

## 2017-03-02 DIAGNOSIS — M47812 Spondylosis without myelopathy or radiculopathy, cervical region: Secondary | ICD-10-CM | POA: Insufficient documentation

## 2017-03-02 DIAGNOSIS — M2578 Osteophyte, vertebrae: Secondary | ICD-10-CM | POA: Diagnosis not present

## 2017-03-02 DIAGNOSIS — R1319 Other dysphagia: Secondary | ICD-10-CM

## 2017-03-02 DIAGNOSIS — I6521 Occlusion and stenosis of right carotid artery: Secondary | ICD-10-CM | POA: Insufficient documentation

## 2017-03-02 LAB — COMPREHENSIVE METABOLIC PANEL
A/G RATIO: 1.4 (ref 1.2–2.2)
ALK PHOS: 39 IU/L (ref 39–117)
ALT: 14 IU/L (ref 0–44)
AST: 11 IU/L (ref 0–40)
Albumin: 4.3 g/dL (ref 3.6–4.8)
BILIRUBIN TOTAL: 1.2 mg/dL (ref 0.0–1.2)
BUN/Creatinine Ratio: 17 (ref 10–24)
BUN: 29 mg/dL — ABNORMAL HIGH (ref 8–27)
CHLORIDE: 99 mmol/L (ref 96–106)
CO2: 20 mmol/L (ref 20–29)
Calcium: 9.7 mg/dL (ref 8.6–10.2)
Creatinine, Ser: 1.7 mg/dL — ABNORMAL HIGH (ref 0.76–1.27)
GFR calc Af Amer: 48 mL/min/{1.73_m2} — ABNORMAL LOW (ref >59)
GFR calc non Af Amer: 41 mL/min/{1.73_m2} — ABNORMAL LOW (ref >59)
Globulin, Total: 3 g/dL (ref 1.5–4.5)
Glucose: 142 mg/dL — ABNORMAL HIGH (ref 65–99)
POTASSIUM: 4.5 mmol/L (ref 3.5–5.2)
Sodium: 140 mmol/L (ref 134–144)
Total Protein: 7.3 g/dL (ref 6.0–8.5)

## 2017-03-02 LAB — CBC WITH DIFFERENTIAL/PLATELET
BASOS: 0 %
Basophils Absolute: 0 10*3/uL (ref 0.0–0.2)
EOS (ABSOLUTE): 0.1 10*3/uL (ref 0.0–0.4)
Eos: 1 %
Hematocrit: 35.9 % — ABNORMAL LOW (ref 37.5–51.0)
Hemoglobin: 11 g/dL — ABNORMAL LOW (ref 13.0–17.7)
Immature Grans (Abs): 0 10*3/uL (ref 0.0–0.1)
Immature Granulocytes: 0 %
Lymphocytes Absolute: 1.2 10*3/uL (ref 0.7–3.1)
Lymphs: 12 %
MCH: 24 pg — AB (ref 26.6–33.0)
MCHC: 30.6 g/dL — AB (ref 31.5–35.7)
MCV: 78 fL — AB (ref 79–97)
MONOS ABS: 0.8 10*3/uL (ref 0.1–0.9)
Monocytes: 9 %
NEUTROS PCT: 78 %
Neutrophils Absolute: 7.7 10*3/uL — ABNORMAL HIGH (ref 1.4–7.0)
PLATELETS: 238 10*3/uL (ref 150–379)
RBC: 4.59 x10E6/uL (ref 4.14–5.80)
RDW: 17.7 % — AB (ref 12.3–15.4)
WBC: 9.9 10*3/uL (ref 3.4–10.8)

## 2017-03-02 LAB — LIPID PANEL WITH LDL/HDL RATIO
Cholesterol, Total: 108 mg/dL (ref 100–199)
HDL: 24 mg/dL — AB (ref >39)
LDL Calculated: 58 mg/dL (ref 0–99)
LDl/HDL Ratio: 2.4 ratio (ref 0.0–3.6)
TRIGLYCERIDES: 132 mg/dL (ref 0–149)
VLDL Cholesterol Cal: 26 mg/dL (ref 5–40)

## 2017-03-02 LAB — TSH: TSH: 0.76 u[IU]/mL (ref 0.450–4.500)

## 2017-03-02 LAB — HEMOGLOBIN A1C
ESTIMATED AVERAGE GLUCOSE: 157 mg/dL
HEMOGLOBIN A1C: 7.1 % — AB (ref 4.8–5.6)

## 2017-03-02 LAB — URIC ACID: Uric Acid: 9.7 mg/dL — ABNORMAL HIGH (ref 3.7–8.6)

## 2017-03-02 LAB — HEPATITIS C ANTIBODY

## 2017-03-02 NOTE — Telephone Encounter (Signed)
Pt advised of lab results, also spoke with patient about his neck pain and he states he is till hurting, he is not able to turn his head due to severity of the pain, having hard time swallowing his pills and choking on them. Per Dr Sullivan Lone placed order for C spine xray. May need referral maybe ENT? Will see what patient shows-aa

## 2017-03-02 NOTE — Telephone Encounter (Signed)
-----   Message from Maple Hudson., MD sent at 03/02/2017 10:48 AM EDT ----- stable

## 2017-03-03 ENCOUNTER — Encounter: Payer: Self-pay | Admitting: Emergency Medicine

## 2017-03-03 DIAGNOSIS — N183 Chronic kidney disease, stage 3 (moderate): Secondary | ICD-10-CM | POA: Insufficient documentation

## 2017-03-03 DIAGNOSIS — Y929 Unspecified place or not applicable: Secondary | ICD-10-CM | POA: Diagnosis not present

## 2017-03-03 DIAGNOSIS — Z7982 Long term (current) use of aspirin: Secondary | ICD-10-CM | POA: Diagnosis not present

## 2017-03-03 DIAGNOSIS — I130 Hypertensive heart and chronic kidney disease with heart failure and stage 1 through stage 4 chronic kidney disease, or unspecified chronic kidney disease: Secondary | ICD-10-CM | POA: Diagnosis not present

## 2017-03-03 DIAGNOSIS — I509 Heart failure, unspecified: Secondary | ICD-10-CM | POA: Diagnosis not present

## 2017-03-03 DIAGNOSIS — W19XXXA Unspecified fall, initial encounter: Secondary | ICD-10-CM | POA: Insufficient documentation

## 2017-03-03 DIAGNOSIS — Z96653 Presence of artificial knee joint, bilateral: Secondary | ICD-10-CM | POA: Insufficient documentation

## 2017-03-03 DIAGNOSIS — M546 Pain in thoracic spine: Secondary | ICD-10-CM | POA: Diagnosis not present

## 2017-03-03 DIAGNOSIS — Z951 Presence of aortocoronary bypass graft: Secondary | ICD-10-CM | POA: Diagnosis not present

## 2017-03-03 DIAGNOSIS — J45909 Unspecified asthma, uncomplicated: Secondary | ICD-10-CM | POA: Insufficient documentation

## 2017-03-03 DIAGNOSIS — Y939 Activity, unspecified: Secondary | ICD-10-CM | POA: Insufficient documentation

## 2017-03-03 DIAGNOSIS — I252 Old myocardial infarction: Secondary | ICD-10-CM | POA: Insufficient documentation

## 2017-03-03 DIAGNOSIS — M549 Dorsalgia, unspecified: Secondary | ICD-10-CM | POA: Diagnosis present

## 2017-03-03 DIAGNOSIS — Z79899 Other long term (current) drug therapy: Secondary | ICD-10-CM | POA: Insufficient documentation

## 2017-03-03 DIAGNOSIS — E1122 Type 2 diabetes mellitus with diabetic chronic kidney disease: Secondary | ICD-10-CM | POA: Insufficient documentation

## 2017-03-03 DIAGNOSIS — Y999 Unspecified external cause status: Secondary | ICD-10-CM | POA: Diagnosis not present

## 2017-03-03 NOTE — ED Triage Notes (Signed)
Pt arrives via POV to triage with c/o fall last Saturday. Pt states that he is still having back pain at this time. Pt reports that his PCP scanned his elbow and neck but not his back. Pt is in NAD at this time.

## 2017-03-04 ENCOUNTER — Emergency Department: Payer: Medicare Other

## 2017-03-04 ENCOUNTER — Emergency Department
Admission: EM | Admit: 2017-03-04 | Discharge: 2017-03-04 | Disposition: A | Discharge status: Home / Self Care | Payer: Medicare Other | Attending: Emergency Medicine | Admitting: Emergency Medicine

## 2017-03-04 DIAGNOSIS — W19XXXA Unspecified fall, initial encounter: Secondary | ICD-10-CM

## 2017-03-04 DIAGNOSIS — M549 Dorsalgia, unspecified: Secondary | ICD-10-CM

## 2017-03-04 DIAGNOSIS — M546 Pain in thoracic spine: Secondary | ICD-10-CM

## 2017-03-04 HISTORY — DX: Heart failure, unspecified: I50.9

## 2017-03-04 MED ORDER — ETODOLAC 200 MG PO CAPS: 200.0000 mg | ORAL_CAPSULE | Freq: Three times a day (TID) | ORAL | 0 refills | Status: AC

## 2017-03-04 MED ORDER — OXYCODONE-ACETAMINOPHEN 5-325 MG PO TABS: 1.0000 | ORAL_TABLET | Freq: Four times a day (QID) | ORAL | 0 refills | Status: AC | PRN

## 2017-03-04 MED ORDER — OXYCODONE-ACETAMINOPHEN 5-325 MG PO TABS
1.0000 | ORAL_TABLET | Freq: Once | ORAL | Status: AC
  Administered 2017-03-04: 1 via ORAL
  Filled 2017-03-04: qty 1

## 2017-03-04 MED ORDER — DIAZEPAM 2 MG PO TABS
2.0000 mg | ORAL_TABLET | Freq: Once | ORAL | Status: AC
  Administered 2017-03-04: 2 mg via ORAL
  Filled 2017-03-04: qty 1

## 2017-03-04 MED ORDER — DIAZEPAM 2 MG PO TABS: 2.0000 mg | ORAL_TABLET | Freq: Four times a day (QID) | ORAL | 0 refills | Status: AC | PRN

## 2017-03-04 MED ORDER — LIDOCAINE 5 % EX PTCH
1.0000 | MEDICATED_PATCH | CUTANEOUS | Status: DC
  Administered 2017-03-04: 1 via TRANSDERMAL
  Filled 2017-03-04: qty 1

## 2017-03-04 MED ORDER — LIDOCAINE 5 % EX PTCH: 1.0000 | MEDICATED_PATCH | Freq: Two times a day (BID) | CUTANEOUS | 0 refills | Status: AC

## 2017-03-04 MED ORDER — KETOROLAC TROMETHAMINE 60 MG/2ML IM SOLN
60.0000 mg | Freq: Once | INTRAMUSCULAR | Status: AC
  Administered 2017-03-04: 60 mg via INTRAMUSCULAR
  Filled 2017-03-04: qty 2

## 2017-03-04 NOTE — ED Provider Notes (Signed)
[facility] Emergency Department Provider Note   ____________________________________________   First MD Initiated Contact with Patient 03/04/17 0214     (approximate)  I have reviewed the triage vital signs and the nursing notes.   HISTORY  Chief Complaint Fall    HPI Joshua Larsen is a 66 y.o. male who comes into the hospital today with back pain. The patient fell last Saturday. He reports he pitched to it and fell onto his left side. He been having some neck pain and side pain. He contacted his primary care physician who did x-ray of his neck as well as his elbow. He reports that everything had been well and the x-rays were negative. Since seeing primary care physician though he reports that he's developed some left-sided chest pain going around from his front to his back. He reports that it goes from his waist to his shoulder. He has had rib fractures in the past and he is concerned he may have a rib fracture. The patient states that he didn't hit his face. He has been taking hydrocodone and a muscle relaxer. He is also had a small dose of prednisone. The patient rates his pain a 5-6 out of 10 in intensity when he still but it's worse when he has the spasms and goes up to a 10. The patient is here today for evaluation.   Past Medical History:  Diagnosis Date  . CAD (coronary artery disease)    a. 08/2000 s/p CABG x 5 (LIMA->LAD, VG->Diag, VG->dRCA->RPL, VG->OM); b. 07/2014 PCI VG->OM, VG->Diag 100, VG->PDA->RPL 100;  c. 08/2014 Cath: patent stent in VG->OM1->Med Rx; d. 10/2015 NSTEMI/VF arrest/CG Shock.  . CHF (congestive heart failure) (HCC)   . Chronic stable angina (HCC)   . CKD (chronic kidney disease), stage III   . Diet-controlled diabetes mellitus (HCC)   . GERD (gastroesophageal reflux disease)   . Heart attack (HCC)   . Hypertensive heart disease   . Sleep apnea    a. On CPAP.    Patient Active Problem List   Diagnosis Date Noted  .  Acute gout 11/15/2015  . Left knee pain 11/10/2015  . History of gout 11/10/2015  . Acute non-ST elevation myocardial infarction (NSTEMI) (HCC)   . Cardiogenic shock (HCC) 11/05/2015  . Hypertensive heart disease   . CAD (coronary artery disease)   . GERD (gastroesophageal reflux disease)   . Encounter for management of intra-aortic balloon pump   . Unstable angina (HCC) 11/04/2015  . Cerebral amyloid angiopathy (HCC) 09/23/2015  . Dizziness and giddiness 06/20/2015  . Symptomatic sinus bradycardia 06/20/2015  . HTN (hypertension) 06/20/2015  . Diabetes (HCC) 06/20/2015  . CKD stage 3 secondary to diabetes (HCC) 06/20/2015  . OSA (obstructive sleep apnea) 06/20/2015  . Elevated troponin 06/18/2015  . Type 2 diabetes mellitus (HCC) 06/10/2015  . Alopecia 02/11/2015  . Adaptation reaction 02/11/2015  . Acute MI (HCC) 02/11/2015  . Arteriosclerosis of coronary artery 02/11/2015  . Clinical depression 02/11/2015  . Diabetes mellitus, type 2 (HCC) 02/11/2015  . Acid reflux 02/11/2015  . Gout 02/11/2015  . Hepatitis A 02/11/2015  . Asthma 02/11/2015  . HLD (hyperlipidemia) 02/11/2015  . BP (high blood pressure) 02/11/2015  . Cannot sleep 02/11/2015  . Arthritis, degenerative 02/11/2015  . Adiposity 02/11/2015  . Acute kidney failure (HCC) 02/11/2015  . Apnea, sleep 02/11/2015  . Calculus of kidney 12/25/2013  . CAD in native artery 12/20/2013    Past Surgical History:  Procedure Laterality  Date  . BACK SURGERY    . CARDIAC CATHETERIZATION     [place] [address]  . CARDIAC CATHETERIZATION N/A 11/05/2015   Procedure: Coronary/Graft Angiography;  Surgeon: Alwyn Pea, MD;  Location: [facility] INVASIVE CV LAB;  Service: Cardiovascular;  Laterality: N/A;  . CARDIAC CATHETERIZATION N/A 11/05/2015   Procedure: IABP Insertion;  Surgeon: Alwyn Pea, MD;  Location: [facility] INVASIVE CV LAB;  Service: Cardiovascular;  Laterality: N/A;  . CARDIAC CATHETERIZATION N/A 11/09/2015   Procedure:  Coronary Stent Intervention;  Surgeon: Peter M Swaziland, MD;  Location[facility] INVASIVE CV LAB;  Service: Cardiovascular;  Laterality: N/A;  . CORONARY ARTERY BYPASS GRAFT     2000  . REPLACEMENT TOTAL KNEE Bilateral    a. 01/2003 LTKA; b. 07/2007 RTKA  . WRIST SURGERY Right     Prior to Admission medications   Medication Sig Start Date End Date Taking? Authorizing Provider  allopurinol (ZYLOPRIM) 100 MG tablet Take 1 tablet (100 mg total) by mouth daily. Please call PCP for further refill. 11/15/15   Manson Passey, PA  aspirin EC 81 MG tablet Take 81 mg by mouth daily.    [provider]  atorvastatin (LIPITOR) 80 MG tablet Take 1 tablet (80 mg total) by mouth every evening. 11/15/15   Bhagat, Sharrell Ku, PA  carvedilol (COREG) 3.125 MG tablet Take 1 tablet (3.125 mg total) by mouth 2 (two) times daily with a meal. 11/15/15   Bhagat, Bhavinkumar, PA  colchicine 0.6 MG tablet Take 1 tablet (0.6 mg total) by mouth 2 (two) times daily. Please call PCP for further refill. Unable to prior autho approval today due to weekend the line was closed (attempted). Office will try later, other wise call PCP. Patient not taking: Reported on 02/28/2017 11/15/15   Manson Passey, PA  cyclobenzaprine (FLEXERIL) 10 MG tablet Take 1 tablet (10 mg total) by mouth 3 (three) times daily as needed for muscle spasms. 02/28/17   Maple Hudson., MD  diazepam (VALIUM) 2 MG tablet Take 1 tablet (2 mg total) by mouth every 6 (six) hours as needed for anxiety. 03/04/17   Rebecka Apley, MD  etodolac (LODINE) 200 MG capsule Take 1 capsule (200 mg total) by mouth every 8 (eight) hours. 03/04/17   Rebecka Apley, MD  fenofibrate 160 MG tablet Take 1 tablet (160 mg total) by mouth daily. 05/30/16   Maple Hudson., MD  furosemide (LASIX) 40 MG tablet Take 1 tablet by mouth daily. 02/23/17   [provider]  glucose blood (FREESTYLE TEST STRIPS) test strip Check sugar once daily, DX E11.9  06/20/16   Maple Hudson., MD  HYDROcodone-acetaminophen (NORCO/VICODIN) 5-325 MG tablet Take 1 tablet by mouth every 6 (six) hours as needed for moderate pain. 02/28/17   Maple Hudson., MD  isosorbide mononitrate (IMDUR) 30 MG 24 hr tablet Take 3 tablets (90 mg total) by mouth daily. 02/27/16   Emily Filbert, MD  lidocaine (LIDODERM) 5 % Place 1 patch onto the skin every 12 (twelve) hours. Remove & Discard patch within 12 hours or as directed by MD 03/04/17 03/04/18  Rebecka Apley, MD  nitroGLYCERIN (NITROSTAT) 0.4 MG SL tablet Place 1 tablet (0.4 mg total) under the tongue every 5 (five) minutes x 3 doses as needed for chest pain. Patient not taking: Reported on 02/28/2017 11/15/15   Manson Passey, PA  oxyCODONE-acetaminophen (ROXICET) 5-325 MG tablet Take 1 tablet by mouth every 6 (six) hours as needed. 03/04/17  Rebecka Apley, MD  pantoprazole (PROTONIX) 40 MG tablet Take 1 tablet (40 mg total) by mouth 2 (two) times daily. 11/15/15   Manson Passey, PA  predniSONE (DELTASONE) 5 MG tablet Take 1 tablet (5 mg total) by mouth daily with breakfast. 02/28/17   Maple Hudson., MD  ticagrelor (BRILINTA) 90 MG TABS tablet Take 1 tablet (90 mg total) by mouth 2 (two) times daily. 11/15/15   Manson Passey, PA    Allergies Amoxicillin; Codeine; and Tagamet [cimetidine]  Family History  Problem Relation Age of Onset  . Liver cancer Mother   . CAD Mother   . Arthritis Mother   . Heart attack Father   . CVA Father   . CAD Father   . Dementia Sister     Social History Social History  Substance Use Topics  . Smoking status: Never Smoker  . Smokeless tobacco: Never Used  . Alcohol use No    Review of Systems  Constitutional: No fever/chills Eyes: No visual changes. ENT: No sore throat. Cardiovascular: Denies chest pain. Respiratory: Denies shortness of breath. Gastrointestinal: No abdominal pain.  No nausea, no vomiting.  No diarrhea.  No  constipation. Genitourinary: Negative for dysuria. Musculoskeletal: Left chest and back pain. Neck pain and stiffness Skin: Negative for rash. Neurological: Negative for headaches, focal weakness or numbness.   ____________________________________________   PHYSICAL EXAM:  VITAL SIGNS: ED Triage Vitals  Enc Vitals Group     BP 03/03/17 2257 135/81     Pulse Rate 03/03/17 2257 96     Resp 03/03/17 2257 18     Temp 03/03/17 2257 99.5 F (37.5 C)     Temp Source 03/03/17 2257 Oral     SpO2 03/03/17 2257 97 %     Weight 03/03/17 2258 (!) 301 lb (136.5 kg)     Height 03/03/17 2258 5\' 9"  (1.753 m)     Head Circumference --      Peak Flow --      Pain Score 03/03/17 2257 6     Pain Loc --      Pain Edu? --      Excl. in GC? --     Constitutional: Alert and oriented. Well appearing and in Moderate distress. The patient is sitting very still but will have occasional spasms which makes him yell out in pain. Eyes: Conjunctivae are normal. PERRL. EOMI. Head: Atraumatic. Nose: No congestion/rhinnorhea. Mouth/Throat: Mucous membranes are moist.  Oropharynx non-erythematous. Cardiovascular: Normal rate, regular rhythm. Grossly normal heart sounds.  Good peripheral circulation. Respiratory: Normal respiratory effort.  No retractions. Lungs CTAB. Gastrointestinal: Soft and nontender. No distention. Positive bowel sounds Musculoskeletal: Tenderness to palpation of the midthoracic spine with no lateral or anterior chest wall tenderness to palpation. No bruising noted, no erythema noted. Neurologic:  Normal speech and language.  Skin:  Skin is warm, dry and intact. Marland Kitchen Psychiatric: Mood and affect are normal.   ____________________________________________   LABS (all labs ordered are listed, but only abnormal results are displayed)  Labs Reviewed - No data to  display ____________________________________________  EKG  none ____________________________________________  RADIOLOGY  Dg Chest 2 View  Result Date: 03/04/2017 CLINICAL DATA:  Larey Seat 1 week ago, persistent LEFT chest and back pain. EXAM: CHEST  2 VIEW COMPARISON:  CT chest July 06, 2016 FINDINGS: Cardiac silhouette is mildly enlarged, status post median sternotomy for CABG. Bibasilar strandy densities. No pleural effusions or focal consolidations. Nipple shadows project in the lung bases. Trachea projects midline  and there is no pneumothorax. Soft tissue planes and included osseous structures are non-suspicious. Moderate degenerative change of thoracic spine. Degenerative change of the shoulders with small suspected loose bodies on the LEFT. IMPRESSION: Mild cardiomegaly.  Bibasilar atelectasis. Electronically Signed   By: Awilda Metro M.D.   On: 03/04/2017 03:21   Dg Thoracic Spine 2 View  Result Date: 03/04/2017 CLINICAL DATA:  LEFT chest and back pain after fall 1 week ago. EXAM: THORACIC SPINE 2 VIEWS COMPARISON:  None. FINDINGS: Motion degraded swimmer's view. There is no evidence of thoracic spine fracture. Alignment is normal. No other significant bone abnormalities are identified. Multilevel moderate ventral endplate spurring with relatively preserved disc heights. Status post median sternotomy for CABG. IMPRESSION: Degenerative change of new thoracic spine without acute fracture deformity or malalignment. Electronically Signed   By: Awilda Metro M.D.   On: 03/04/2017 03:18    ____________________________________________   PROCEDURES  Procedure(s) performed: None  Procedures  Critical Care performed: No  ____________________________________________   INITIAL IMPRESSION / ASSESSMENT AND PLAN / ED COURSE  Pertinent labs & imaging results that were available during my care of the patient were reviewed by me and considered in my medical decision making (see chart  for details).  This is a 66 year old male who comes into the hospital today after a fall approximately one week ago. The patient is having some left-sided back and chest pain. The patient is tender to his midthoracic spine and laterally but no other tenderness is noted. I sent the patient for some x-rays and he does not have any acute fracture. The patient has some degenerative change of his thoracic spine. I gave the patient a shot of Toradol, a Lidoderm patch, Valium and Percocet. When I went back in to evaluate the patient he was sitting more comfortably and reports that he feels much improved. I explained that he does have some degenerative changes in his back with no acute fractures. The patient states that he has a follow-up appointment with his primary care physician on Tuesday. He will be discharged home to follow-up with his primary care physician.      ____________________________________________   FINAL CLINICAL IMPRESSION(S) / ED DIAGNOSES  Final diagnoses:  Fall, initial encounter  Acute midline thoracic back pain  Musculoskeletal back pain      NEW MEDICATIONS STARTED DURING THIS VISIT:  Discharge Medication List as of 03/04/2017  4:22 AM    START taking these medications   Details  diazepam (VALIUM) 2 MG tablet Take 1 tablet (2 mg total) by mouth every 6 (six) hours as needed for anxiety., Starting Sat 03/04/2017, Print    etodolac (LODINE) 200 MG capsule Take 1 capsule (200 mg total) by mouth every 8 (eight) hours., Starting Sat 03/04/2017, Print    lidocaine (LIDODERM) 5 % Place 1 patch onto the skin every 12 (twelve) hours. Remove & Discard patch within 12 hours or as directed by MD, Starting Sat 03/04/2017, Until Sun 03/04/2018, Print    oxyCODONE-acetaminophen (ROXICET) 5-325 MG tablet Take 1 tablet by mouth every 6 (six) hours as needed., Starting Sat 03/04/2017, Print         Note:  This document was prepared using Dragon voice recognition software and may  include unintentional dictation errors.    Rebecka Apley, MD 03/04/17 [phone]

## 2017-03-04 NOTE — Discharge Instructions (Signed)
Please follow up with your primary care physician as you have scheduled °

## 2017-03-07 ENCOUNTER — Encounter: Payer: Self-pay | Admitting: Family Medicine

## 2017-03-07 ENCOUNTER — Ambulatory Visit (INDEPENDENT_AMBULATORY_CARE_PROVIDER_SITE_OTHER): Payer: Medicare Other | Admitting: Family Medicine

## 2017-03-07 VITALS — BP 116/44 | HR 94 | Temp 98.0°F | Resp 16 | Wt 296.0 lb

## 2017-03-07 DIAGNOSIS — S134XXA Sprain of ligaments of cervical spine, initial encounter: Secondary | ICD-10-CM | POA: Diagnosis not present

## 2017-03-07 DIAGNOSIS — M542 Cervicalgia: Secondary | ICD-10-CM | POA: Diagnosis not present

## 2017-03-07 DIAGNOSIS — M62838 Other muscle spasm: Secondary | ICD-10-CM

## 2017-03-07 MED ORDER — KETOROLAC TROMETHAMINE 60 MG/2ML IM SOLN
60.0000 mg | Freq: Once | INTRAMUSCULAR | Status: AC
  Administered 2017-03-07: 60 mg via INTRAMUSCULAR

## 2017-03-07 NOTE — Progress Notes (Signed)
Subjective:  HPI Pt is here for a follow up of muscle spasms and fall. He reports that he is feeling a little better. He is still hurting a lot. Since he was seen here he went to the ED because he could not breath due to the back pain and muscle spasms. He reports that they gave him valium, percocet, Lidoderm patches and a Toradol injection. He reports that he can not put the Lidoderm patches on because he does not have anyone to put them on him. He wants to know if he can put them on his neck and if he can get an order for a neck brace because he feels like his head wants to weight down in the front and he has a hard time keeping his head up because of the neck pain.   Prior to Admission medications   Medication Sig Start Date End Date Taking? Authorizing Provider  allopurinol (ZYLOPRIM) 100 MG tablet Take 1 tablet (100 mg total) by mouth daily. Please call PCP for further refill. 11/15/15   Manson Passey, PA  aspirin EC 81 MG tablet Take 81 mg by mouth daily.    [provider]  atorvastatin (LIPITOR) 80 MG tablet Take 1 tablet (80 mg total) by mouth every evening. 11/15/15   Bhagat, Sharrell Ku, PA  carvedilol (COREG) 3.125 MG tablet Take 1 tablet (3.125 mg total) by mouth 2 (two) times daily with a meal. 11/15/15   Bhagat, Bhavinkumar, PA  colchicine 0.6 MG tablet Take 1 tablet (0.6 mg total) by mouth 2 (two) times daily. Please call PCP for further refill. Unable to prior autho approval today due to weekend the line was closed (attempted). Office will try later, other wise call PCP. Patient not taking: Reported on 02/28/2017 11/15/15   Manson Passey, PA  cyclobenzaprine (FLEXERIL) 10 MG tablet Take 1 tablet (10 mg total) by mouth 3 (three) times daily as needed for muscle spasms. 02/28/17   Maple Hudson., MD  diazepam (VALIUM) 2 MG tablet Take 1 tablet (2 mg total) by mouth every 6 (six) hours as needed for anxiety. 03/04/17   Rebecka Apley, MD  etodolac (LODINE)  200 MG capsule Take 1 capsule (200 mg total) by mouth every 8 (eight) hours. 03/04/17   Rebecka Apley, MD  fenofibrate 160 MG tablet Take 1 tablet (160 mg total) by mouth daily. 05/30/16   Maple Hudson., MD  furosemide (LASIX) 40 MG tablet Take 1 tablet by mouth daily. 02/23/17   [provider]  glucose blood (FREESTYLE TEST STRIPS) test strip Check sugar once daily, DX E11.9 06/20/16   Maple Hudson., MD  HYDROcodone-acetaminophen (NORCO/VICODIN) 5-325 MG tablet Take 1 tablet by mouth every 6 (six) hours as needed for moderate pain. 02/28/17   Maple Hudson., MD  isosorbide mononitrate (IMDUR) 30 MG 24 hr tablet Take 3 tablets (90 mg total) by mouth daily. 02/27/16   Emily Filbert, MD  lidocaine (LIDODERM) 5 % Place 1 patch onto the skin every 12 (twelve) hours. Remove & Discard patch within 12 hours or as directed by MD 03/04/17 03/04/18  Rebecka Apley, MD  nitroGLYCERIN (NITROSTAT) 0.4 MG SL tablet Place 1 tablet (0.4 mg total) under the tongue every 5 (five) minutes x 3 doses as needed for chest pain. Patient not taking: Reported on 02/28/2017 11/15/15   Manson Passey, PA  oxyCODONE-acetaminophen (ROXICET) 5-325 MG tablet Take 1 tablet by mouth every 6 (six) hours  as needed. 03/04/17   Rebecka Apley, MD  pantoprazole (PROTONIX) 40 MG tablet Take 1 tablet (40 mg total) by mouth 2 (two) times daily. 11/15/15   Manson Passey, PA  predniSONE (DELTASONE) 5 MG tablet Take 1 tablet (5 mg total) by mouth daily with breakfast. 02/28/17   Maple Hudson., MD  ticagrelor (BRILINTA) 90 MG TABS tablet Take 1 tablet (90 mg total) by mouth 2 (two) times daily. 11/15/15   Manson Passey, PA    Patient Active Problem List   Diagnosis Date Noted  . Acute gout 11/15/2015  . Left knee pain 11/10/2015  . History of gout 11/10/2015  . Acute non-ST elevation myocardial infarction (NSTEMI) (HCC)   . Cardiogenic shock (HCC) 11/05/2015  . Hypertensive  heart disease   . CAD (coronary artery disease)   . GERD (gastroesophageal reflux disease)   . Encounter for management of intra-aortic balloon pump   . Unstable angina (HCC) 11/04/2015  . Cerebral amyloid angiopathy (HCC) 09/23/2015  . Dizziness and giddiness 06/20/2015  . Symptomatic sinus bradycardia 06/20/2015  . HTN (hypertension) 06/20/2015  . Diabetes (HCC) 06/20/2015  . CKD stage 3 secondary to diabetes (HCC) 06/20/2015  . OSA (obstructive sleep apnea) 06/20/2015  . Elevated troponin 06/18/2015  . Type 2 diabetes mellitus (HCC) 06/10/2015  . Alopecia 02/11/2015  . Adaptation reaction 02/11/2015  . Acute MI (HCC) 02/11/2015  . Arteriosclerosis of coronary artery 02/11/2015  . Clinical depression 02/11/2015  . Diabetes mellitus, type 2 (HCC) 02/11/2015  . Acid reflux 02/11/2015  . Gout 02/11/2015  . Hepatitis A 02/11/2015  . Asthma 02/11/2015  . HLD (hyperlipidemia) 02/11/2015  . BP (high blood pressure) 02/11/2015  . Cannot sleep 02/11/2015  . Arthritis, degenerative 02/11/2015  . Adiposity 02/11/2015  . Acute kidney failure (HCC) 02/11/2015  . Apnea, sleep 02/11/2015  . Calculus of kidney 12/25/2013  . CAD in native artery 12/20/2013    Past Medical History:  Diagnosis Date  . CAD (coronary artery disease)    a. 08/2000 s/p CABG x 5 (LIMA->LAD, VG->Diag, VG->dRCA->RPL, VG->OM); b. 07/2014 PCI VG->OM, VG->Diag 100, VG->PDA->RPL 100;  c. 08/2014 Cath: patent stent in VG->OM1->Med Rx; d. 10/2015 NSTEMI/VF arrest/CG Shock.  . CHF (congestive heart failure) (HCC)   . Chronic stable angina (HCC)   . CKD (chronic kidney disease), stage III   . Diet-controlled diabetes mellitus (HCC)   . GERD (gastroesophageal reflux disease)   . Heart attack (HCC)   . Hypertensive heart disease   . Sleep apnea    a. On CPAP.    Social History   Social History  . Marital status: Divorced    Spouse name: divorced  . Number of children: 1  . Years of education: 91   Occupational  History  . retired    Social History Main Topics  . Smoking status: Never Smoker  . Smokeless tobacco: Never Used  . Alcohol use No  . Drug use: No  . Sexual activity: No   Other Topics Concern  . Not on file   Social History Narrative   Lives at home. He is retired now. 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    Allergies  Allergen Reactions  . Amoxicillin   . Codeine     GI side effects  . Tagamet [Cimetidine] Other (See Comments)    Causes changes in mental status Causes changes in mental status    Review of Systems  Constitutional: Positive for malaise/fatigue.  HENT: Negative.  Eyes: Negative.   Respiratory: Negative.   Cardiovascular: Negative.   Genitourinary: Negative.   Musculoskeletal: Positive for back pain, joint pain, myalgias and neck pain.  Skin: Negative.   Neurological: Negative.   Endo/Heme/Allergies: Negative.   Psychiatric/Behavioral: Negative.     Immunization History  Administered Date(s) Administered  . Pneumococcal Polysaccharide-23 03/15/2012  . Tdap 06/06/2007    Objective:  BP (!) 116/44 (BP Location: Left Arm, Patient Position: Sitting, Cuff Size: Large)   Pulse 94   Temp 98 F (36.7 C) (Oral)   Resp 16   Wt 296 lb (134.3 kg)   BMI 43.71 kg/m   Physical Exam  Constitutional: He is oriented to person, place, and time and well-developed, well-nourished, and in no distress.  More comfortable than last visit.  HENT:  Head: Normocephalic and atraumatic.  Right Ear: External ear normal.  Left Ear: External ear normal.  Nose: Nose normal.  Eyes: Pupils are equal, round, and reactive to light. Conjunctivae and EOM are normal.  Neck: Normal range of motion. Neck supple.  Cardiovascular: Normal rate, regular rhythm, normal heart sounds and intact distal pulses.   Pulmonary/Chest: Effort normal and breath sounds normal.  Abdominal: Soft.  Musculoskeletal: Normal range of motion.  Neurological: He is alert and oriented to  person, place, and time. He has normal reflexes. Gait normal. GCS score is 15.  Skin: Skin is warm and dry.  Psychiatric: Mood, memory, affect and judgment normal.    Lab Results  Component Value Date   WBC 9.9 03/01/2017   HGB 11.0 (L) 03/01/2017   HCT 35.9 (L) 03/01/2017   PLT 238 03/01/2017   GLUCOSE 142 (H) 03/01/2017   CHOL 108 03/01/2017   TRIG 132 03/01/2017   HDL 24 (L) 03/01/2017   LDLCALC 58 03/01/2017   TSH 0.760 03/01/2017   PSA 1.7 11/08/2013   INR 1.12 11/04/2015   HGBA1C 7.1 (H) 03/01/2017    CMP     Component Value Date/Time   NA 140 03/01/2017 1251   NA 140 09/17/2014 0339   K 4.5 03/01/2017 1251   K 4.6 09/17/2014 0339   CL 99 03/01/2017 1251   CL 106 09/17/2014 0339   CO2 20 03/01/2017 1251   CO2 26 09/17/2014 0339   GLUCOSE 142 (H) 03/01/2017 1251   GLUCOSE 195 (H) 02/27/2016 1354   GLUCOSE 122 (H) 09/17/2014 0339   BUN 29 (H) 03/01/2017 1251   BUN 26 (H) 09/17/2014 0339   CREATININE 1.70 (H) 03/01/2017 1251   CREATININE 1.70 (H) 09/17/2014 0339   CALCIUM 9.7 03/01/2017 1251   CALCIUM 9.1 09/17/2014 0339   PROT 7.3 03/01/2017 1251   PROT 8.5 (H) 12/13/2013 1159   ALBUMIN 4.3 03/01/2017 1251   ALBUMIN 3.8 12/13/2013 1159   AST 11 03/01/2017 1251   AST 15 12/13/2013 1159   ALT 14 03/01/2017 1251   ALT 21 12/13/2013 1159   ALKPHOS 39 03/01/2017 1251   ALKPHOS 32 (L) 12/13/2013 1159   BILITOT 1.2 03/01/2017 1251   BILITOT 0.8 12/13/2013 1159   GFRNONAA 41 (L) 03/01/2017 1251   GFRNONAA 43 (L) 09/17/2014 0339   GFRNONAA 28 (L) 12/16/2013 0725   GFRAA 48 (L) 03/01/2017 1251   GFRAA 53 (L) 09/17/2014 0339   GFRAA 33 (L) 12/16/2013 0725    Assessment and Plan :  1. Acute whiplash injury, initial encounter  - ketorolac (TORADOL) injection 60 mg; Inject 2 mLs (60 mg total) into the muscle once.  2. Neck pain  -  ketorolac (TORADOL) injection 60 mg; Inject 2 mLs (60 mg total) into the muscle once.  3. Muscle spasms of neck  - ketorolac  (TORADOL) injection 60 mg; Inject 2 mLs (60 mg total) into the muscle once. 4.CAD 5.Obesity 6.Gout  HPI, Exam, and A&P Transcribed under the direction and in the presence of Tinsley Everman L. Wendelyn Breslow, MD  Electronically Signed: Silvio Pate, CMA I have done the exam and reviewed the chart and it is accurate to the best of my knowledge. Dentist has been used and  any errors in dictation or transcription are unintentional. Julieanne Manson M.D. [facility]   Julieanne Manson MD [facility] 03/07/2017 3:08 PM

## 2017-05-22 DEATH — deceased

## 2017-08-09 ENCOUNTER — Encounter: Payer: Self-pay | Admitting: Family Medicine
